# Patient Record
Sex: Male | Born: 1935 | Race: Black or African American | Hispanic: No | Smoking: Former smoker
Health system: Southern US, Community
[De-identification: ages and names within clinical notes are randomized; demographics above are authoritative.]

## PROBLEM LIST (undated history)

## (undated) DIAGNOSIS — F329 Major depressive disorder, single episode, unspecified: Secondary | ICD-10-CM

## (undated) DIAGNOSIS — R7303 Prediabetes: Secondary | ICD-10-CM

## (undated) DIAGNOSIS — E119 Type 2 diabetes mellitus without complications: Secondary | ICD-10-CM

## (undated) DIAGNOSIS — I739 Peripheral vascular disease, unspecified: Secondary | ICD-10-CM

## (undated) DIAGNOSIS — J310 Chronic rhinitis: Secondary | ICD-10-CM

## (undated) DIAGNOSIS — C679 Malignant neoplasm of bladder, unspecified: Secondary | ICD-10-CM

## (undated) DIAGNOSIS — Z8042 Family history of malignant neoplasm of prostate: Secondary | ICD-10-CM

## (undated) DIAGNOSIS — J449 Chronic obstructive pulmonary disease, unspecified: Secondary | ICD-10-CM

## (undated) DIAGNOSIS — J069 Acute upper respiratory infection, unspecified: Secondary | ICD-10-CM

## (undated) DIAGNOSIS — C61 Malignant neoplasm of prostate: Secondary | ICD-10-CM

## (undated) DIAGNOSIS — I499 Cardiac arrhythmia, unspecified: Secondary | ICD-10-CM

## (undated) DIAGNOSIS — F32A Depression, unspecified: Secondary | ICD-10-CM

## (undated) DIAGNOSIS — M1990 Unspecified osteoarthritis, unspecified site: Secondary | ICD-10-CM

## (undated) DIAGNOSIS — N200 Calculus of kidney: Secondary | ICD-10-CM

## (undated) DIAGNOSIS — I10 Essential (primary) hypertension: Secondary | ICD-10-CM

## (undated) DIAGNOSIS — IMO0002 Reserved for concepts with insufficient information to code with codable children: Secondary | ICD-10-CM

## (undated) DIAGNOSIS — Z5189 Encounter for other specified aftercare: Secondary | ICD-10-CM

## (undated) DIAGNOSIS — K635 Polyp of colon: Secondary | ICD-10-CM

## (undated) DIAGNOSIS — IMO0001 Reserved for inherently not codable concepts without codable children: Secondary | ICD-10-CM

## (undated) HISTORY — DX: Unspecified osteoarthritis, unspecified site: M19.90

## (undated) HISTORY — DX: Malignant neoplasm of prostate: C61

## (undated) HISTORY — DX: Type 2 diabetes mellitus without complications: E11.9

## (undated) HISTORY — DX: Polyp of colon: K63.5

## (undated) HISTORY — DX: Malignant neoplasm of bladder, unspecified: C67.9

## (undated) HISTORY — PX: ELBOW BURSA SURGERY: SHX615

## (undated) HISTORY — DX: Depression, unspecified: F32.A

## (undated) HISTORY — PX: HYDROCELE EXCISION: SHX482

## (undated) HISTORY — DX: Calculus of kidney: N20.0

## (undated) HISTORY — DX: Major depressive disorder, single episode, unspecified: F32.9

---
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
## 1985-09-09 HISTORY — PX: PROSTATE SURGERY: SHX751

## 1998-06-26 ENCOUNTER — Encounter: Payer: Self-pay | Admitting: *Deleted

## 1998-06-30 ENCOUNTER — Ambulatory Visit (HOSPITAL_COMMUNITY): Admission: RE | Admit: 1998-06-30 | Discharge: 1998-06-30 | Payer: Self-pay | Admitting: *Deleted

## 1999-09-10 HISTORY — PX: TOTAL HIP ARTHROPLASTY: SHX124

## 1999-11-12 ENCOUNTER — Encounter: Payer: Self-pay | Admitting: *Deleted

## 1999-11-12 ENCOUNTER — Encounter: Admission: RE | Admit: 1999-11-12 | Discharge: 1999-11-12 | Payer: Self-pay | Admitting: *Deleted

## 1999-11-14 ENCOUNTER — Encounter: Payer: Self-pay | Admitting: *Deleted

## 1999-11-15 ENCOUNTER — Encounter: Payer: Self-pay | Admitting: *Deleted

## 1999-11-15 ENCOUNTER — Observation Stay (HOSPITAL_COMMUNITY): Admission: RE | Admit: 1999-11-15 | Discharge: 1999-11-16 | Payer: Self-pay | Admitting: *Deleted

## 1999-12-10 ENCOUNTER — Ambulatory Visit (HOSPITAL_COMMUNITY): Admission: RE | Admit: 1999-12-10 | Discharge: 1999-12-10 | Payer: Self-pay | Admitting: Pediatrics

## 1999-12-10 ENCOUNTER — Encounter: Payer: Self-pay | Admitting: *Deleted

## 2000-02-11 ENCOUNTER — Encounter (INDEPENDENT_AMBULATORY_CARE_PROVIDER_SITE_OTHER): Payer: Self-pay | Admitting: *Deleted

## 2000-02-11 ENCOUNTER — Inpatient Hospital Stay (HOSPITAL_COMMUNITY): Admission: RE | Admit: 2000-02-11 | Discharge: 2000-02-15 | Payer: Self-pay | Admitting: Orthopaedic Surgery

## 2002-09-09 HISTORY — PX: CYST EXCISION: SHX5701

## 2002-09-17 ENCOUNTER — Ambulatory Visit (HOSPITAL_COMMUNITY): Admission: RE | Admit: 2002-09-17 | Discharge: 2002-09-17 | Payer: Self-pay | Admitting: *Deleted

## 2002-09-17 ENCOUNTER — Encounter: Payer: Self-pay | Admitting: *Deleted

## 2003-08-11 ENCOUNTER — Encounter: Admission: RE | Admit: 2003-08-11 | Discharge: 2003-08-11 | Payer: Self-pay | Admitting: General Surgery

## 2003-08-15 ENCOUNTER — Ambulatory Visit (HOSPITAL_BASED_OUTPATIENT_CLINIC_OR_DEPARTMENT_OTHER): Admission: RE | Admit: 2003-08-15 | Discharge: 2003-08-15 | Payer: Self-pay | Admitting: General Surgery

## 2003-08-15 ENCOUNTER — Ambulatory Visit (HOSPITAL_COMMUNITY): Admission: RE | Admit: 2003-08-15 | Discharge: 2003-08-15 | Payer: Self-pay | Admitting: General Surgery

## 2003-08-15 ENCOUNTER — Encounter (INDEPENDENT_AMBULATORY_CARE_PROVIDER_SITE_OTHER): Payer: Self-pay | Admitting: *Deleted

## 2003-09-10 HISTORY — PX: BUNIONECTOMY: SHX129

## 2004-08-27 ENCOUNTER — Ambulatory Visit: Payer: Self-pay | Admitting: Internal Medicine

## 2004-09-12 ENCOUNTER — Ambulatory Visit: Payer: Self-pay | Admitting: Internal Medicine

## 2004-09-21 ENCOUNTER — Ambulatory Visit: Payer: Self-pay | Admitting: Internal Medicine

## 2005-06-19 ENCOUNTER — Emergency Department (HOSPITAL_COMMUNITY): Admission: EM | Admit: 2005-06-19 | Discharge: 2005-06-19 | Payer: Self-pay | Admitting: Emergency Medicine

## 2005-07-27 ENCOUNTER — Emergency Department (HOSPITAL_COMMUNITY): Admission: EM | Admit: 2005-07-27 | Discharge: 2005-07-27 | Payer: Self-pay | Admitting: Emergency Medicine

## 2006-10-08 ENCOUNTER — Ambulatory Visit: Payer: Self-pay | Admitting: Gastroenterology

## 2006-10-22 ENCOUNTER — Ambulatory Visit: Payer: Self-pay | Admitting: Gastroenterology

## 2006-10-22 ENCOUNTER — Encounter (INDEPENDENT_AMBULATORY_CARE_PROVIDER_SITE_OTHER): Payer: Self-pay | Admitting: *Deleted

## 2007-02-25 ENCOUNTER — Encounter: Admission: RE | Admit: 2007-02-25 | Discharge: 2007-02-25 | Payer: Self-pay | Admitting: Orthopaedic Surgery

## 2007-04-28 ENCOUNTER — Ambulatory Visit: Payer: Self-pay | Admitting: Internal Medicine

## 2007-05-07 ENCOUNTER — Ambulatory Visit: Payer: Self-pay | Admitting: Internal Medicine

## 2007-05-19 ENCOUNTER — Ambulatory Visit: Payer: Self-pay | Admitting: Internal Medicine

## 2007-06-10 HISTORY — PX: BACK SURGERY: SHX140

## 2007-06-24 ENCOUNTER — Inpatient Hospital Stay (HOSPITAL_COMMUNITY): Admission: RE | Admit: 2007-06-24 | Discharge: 2007-06-26 | Payer: Self-pay | Admitting: Orthopaedic Surgery

## 2007-09-17 DIAGNOSIS — J310 Chronic rhinitis: Secondary | ICD-10-CM | POA: Insufficient documentation

## 2007-09-17 DIAGNOSIS — Z8546 Personal history of malignant neoplasm of prostate: Secondary | ICD-10-CM | POA: Insufficient documentation

## 2007-09-17 DIAGNOSIS — J449 Chronic obstructive pulmonary disease, unspecified: Secondary | ICD-10-CM | POA: Insufficient documentation

## 2007-09-17 DIAGNOSIS — IMO0002 Reserved for concepts with insufficient information to code with codable children: Secondary | ICD-10-CM | POA: Insufficient documentation

## 2007-09-21 ENCOUNTER — Encounter: Payer: Self-pay | Admitting: Internal Medicine

## 2008-01-14 ENCOUNTER — Ambulatory Visit: Payer: Self-pay | Admitting: Internal Medicine

## 2008-12-09 ENCOUNTER — Emergency Department (HOSPITAL_COMMUNITY): Admission: EM | Admit: 2008-12-09 | Discharge: 2008-12-09 | Payer: Self-pay | Admitting: Emergency Medicine

## 2009-08-09 HISTORY — PX: CYSTO: SHX6284

## 2009-08-14 ENCOUNTER — Ambulatory Visit (HOSPITAL_BASED_OUTPATIENT_CLINIC_OR_DEPARTMENT_OTHER): Admission: RE | Admit: 2009-08-14 | Discharge: 2009-08-14 | Payer: Self-pay | Admitting: Urology

## 2009-09-06 ENCOUNTER — Encounter (INDEPENDENT_AMBULATORY_CARE_PROVIDER_SITE_OTHER): Payer: Self-pay | Admitting: Orthopaedic Surgery

## 2009-09-06 ENCOUNTER — Inpatient Hospital Stay (HOSPITAL_COMMUNITY): Admission: RE | Admit: 2009-09-06 | Discharge: 2009-09-09 | Payer: Self-pay | Admitting: Orthopaedic Surgery

## 2010-11-24 LAB — CBC
HCT: 31 % — ABNORMAL LOW (ref 39.0–52.0)
Hemoglobin: 10.2 g/dL — ABNORMAL LOW (ref 13.0–17.0)
MCHC: 33 g/dL (ref 30.0–36.0)
MCV: 72.3 fL — ABNORMAL LOW (ref 78.0–100.0)
Platelets: 203 10*3/uL (ref 150–400)
RBC: 4.29 MIL/uL (ref 4.22–5.81)
RDW: 15.4 % (ref 11.5–15.5)
WBC: 11.4 10*3/uL — ABNORMAL HIGH (ref 4.0–10.5)

## 2010-11-24 LAB — PROTIME-INR
INR: 1.5 — ABNORMAL HIGH (ref 0.00–1.49)
Prothrombin Time: 18 seconds — ABNORMAL HIGH (ref 11.6–15.2)

## 2010-12-10 LAB — BASIC METABOLIC PANEL
BUN: 12 mg/dL (ref 6–23)
BUN: 9 mg/dL (ref 6–23)
CO2: 29 mEq/L (ref 19–32)
CO2: 29 mEq/L (ref 19–32)
Calcium: 8.3 mg/dL — ABNORMAL LOW (ref 8.4–10.5)
Calcium: 8.4 mg/dL (ref 8.4–10.5)
Chloride: 101 mEq/L (ref 96–112)
Chloride: 99 mEq/L (ref 96–112)
Creatinine, Ser: 0.96 mg/dL (ref 0.4–1.5)
Creatinine, Ser: 0.98 mg/dL (ref 0.4–1.5)
GFR calc Af Amer: >60 mL/min (ref >60)
GFR calc Af Amer: >60 mL/min (ref >60)
GFR calc non Af Amer: >60 mL/min (ref >60)
GFR calc non Af Amer: >60 mL/min (ref >60)
Glucose, Bld: 131 mg/dL — ABNORMAL HIGH (ref 70–99)
Glucose, Bld: 142 mg/dL — ABNORMAL HIGH (ref 70–99)
Potassium: 4.1 mEq/L (ref 3.5–5.1)
Potassium: 4.1 mEq/L (ref 3.5–5.1)
Sodium: 134 mEq/L — ABNORMAL LOW (ref 135–145)
Sodium: 137 mEq/L (ref 135–145)

## 2010-12-10 LAB — CBC
HCT: 32.4 % — ABNORMAL LOW (ref 39.0–52.0)
HCT: 32.4 % — ABNORMAL LOW (ref 39.0–52.0)
HCT: 38.8 % — ABNORMAL LOW (ref 39.0–52.0)
Hemoglobin: 10.3 g/dL — ABNORMAL LOW (ref 13.0–17.0)
Hemoglobin: 10.4 g/dL — ABNORMAL LOW (ref 13.0–17.0)
Hemoglobin: 12.4 g/dL — ABNORMAL LOW (ref 13.0–17.0)
MCHC: 31.9 g/dL (ref 30.0–36.0)
MCHC: 32.1 g/dL (ref 30.0–36.0)
MCHC: 32.1 g/dL (ref 30.0–36.0)
MCV: 72.7 fL — ABNORMAL LOW (ref 78.0–100.0)
MCV: 73 fL — ABNORMAL LOW (ref 78.0–100.0)
MCV: 73.2 fL — ABNORMAL LOW (ref 78.0–100.0)
Platelets: 226 10*3/uL (ref 150–400)
Platelets: 233 10*3/uL (ref 150–400)
Platelets: 250 10*3/uL (ref 150–400)
RBC: 4.42 MIL/uL (ref 4.22–5.81)
RBC: 4.44 MIL/uL (ref 4.22–5.81)
RBC: 5.34 MIL/uL (ref 4.22–5.81)
RDW: 15.3 % (ref 11.5–15.5)
RDW: 15.4 % (ref 11.5–15.5)
RDW: 15.7 % — ABNORMAL HIGH (ref 11.5–15.5)
WBC: 10.5 10*3/uL (ref 4.0–10.5)
WBC: 6.3 10*3/uL (ref 4.0–10.5)
WBC: 7 10*3/uL (ref 4.0–10.5)

## 2010-12-10 LAB — URINALYSIS, ROUTINE W REFLEX MICROSCOPIC
Bilirubin Urine: NEGATIVE
Glucose, UA: NEGATIVE mg/dL
Hgb urine dipstick: NEGATIVE
Ketones, ur: NEGATIVE mg/dL
Nitrite: NEGATIVE
Protein, ur: NEGATIVE mg/dL
Specific Gravity, Urine: 1.02 (ref 1.005–1.030)
Urobilinogen, UA: 1 mg/dL (ref 0.0–1.0)
pH: 7 (ref 5.0–8.0)

## 2010-12-10 LAB — URINE MICROSCOPIC-ADD ON

## 2010-12-10 LAB — COMPREHENSIVE METABOLIC PANEL
ALT: 23 U/L (ref 0–53)
AST: 26 U/L (ref 0–37)
Albumin: 3.7 g/dL (ref 3.5–5.2)
Alkaline Phosphatase: 53 U/L (ref 39–117)
BUN: 15 mg/dL (ref 6–23)
CO2: 27 mEq/L (ref 19–32)
Calcium: 9.2 mg/dL (ref 8.4–10.5)
Chloride: 103 mEq/L (ref 96–112)
Creatinine, Ser: 0.82 mg/dL (ref 0.4–1.5)
GFR calc Af Amer: >60 mL/min (ref >60)
GFR calc non Af Amer: >60 mL/min (ref >60)
Glucose, Bld: 95 mg/dL (ref 70–99)
Potassium: 4.6 mEq/L (ref 3.5–5.1)
Sodium: 137 mEq/L (ref 135–145)
Total Bilirubin: 0.8 mg/dL (ref 0.3–1.2)
Total Protein: 6.4 g/dL (ref 6.0–8.3)

## 2010-12-10 LAB — PROTIME-INR
INR: 1.15 (ref 0.00–1.49)
INR: 1.33 (ref 0.00–1.49)
Prothrombin Time: 14.6 seconds (ref 11.6–15.2)
Prothrombin Time: 16.4 seconds — ABNORMAL HIGH (ref 11.6–15.2)

## 2010-12-10 LAB — BRAIN NATRIURETIC PEPTIDE: Pro B Natriuretic peptide (BNP): <30 pg/mL (ref 0.0–100.0)

## 2010-12-11 LAB — POCT I-STAT 4, (NA,K, GLUC, HGB,HCT)
Glucose, Bld: 103 mg/dL — ABNORMAL HIGH (ref 70–99)
HCT: 44 % (ref 39.0–52.0)
Hemoglobin: 15 g/dL (ref 13.0–17.0)
Potassium: 4.3 mEq/L (ref 3.5–5.1)
Sodium: 141 mEq/L (ref 135–145)

## 2010-12-19 LAB — URINALYSIS, ROUTINE W REFLEX MICROSCOPIC
Bilirubin Urine: NEGATIVE
Glucose, UA: NEGATIVE mg/dL
Hgb urine dipstick: NEGATIVE
Ketones, ur: NEGATIVE mg/dL
Nitrite: NEGATIVE
Protein, ur: NEGATIVE mg/dL
Specific Gravity, Urine: 1.023 (ref 1.005–1.030)
Urobilinogen, UA: 0.2 mg/dL (ref 0.0–1.0)
pH: 6 (ref 5.0–8.0)

## 2011-01-22 NOTE — Op Note (Signed)
Geoffrey Frank, Geoffrey Frank               ACCOUNT NO.:  [id number]   MEDICAL RECORD NO.:  [id number]          PATIENT TYPE:  INP   LOCATION:  2550                         FACILITY:  [facility]   PHYSICIAN:  Mark C. Ophelia Charter, M.D.    DATE OF BIRTH:  [birth year]   DATE OF PROCEDURE:  06/24/2007  DATE OF DISCHARGE:                               OPERATIVE REPORT   PREOPERATIVE DIAGNOSIS:  Multilevel lumbar stenosis.   POSTOPERATIVE DIAGNOSIS:  Multilevel lumbar stenosis.   OPERATION/PROCEDURE:  L1-2, L2-3, L3-4 central decompression.   SURGEON:  Annell Greening, M.D.   ASSISTANT:  Maud Deed, PA-C   ANESTHESIA:  General orotracheal.   ESTIMATED BLOOD LOSS:  250 mL.   DRAINS:  One Hemovac subcutaneously.   INDICATIONS:  This 77 year old male has had spinal stenosis symptoms and  has to lean forward to ambulate.  He has developed progressive foot drop  on one side and some quad weakness in the opposite side.  Preoperative  CT myelogram showed complete block at the 3-4 level.  He has had  previous surgery at 4-5 level and has severe stenosis at the 2-3 and  severe at L1-2 as well.   DESCRIPTION OF PROCEDURE:  After induction general anesthesia, the  patient was placed prone on chest rolls.  Back was prepped with  DuraPrep.  The area was squared with towels.  Sterile skin marker on the  old incision as well as extension proximally and Betadine Vi-Drape.  Needle localization with two spinal needles were placed.  Cross-table  lateral x-ray was taken.  Based on the x-rays, the incision was  proceeded down the spinous process with subperiosteal dissection.  There  were very narrow distance between the facets, about 1.5 cm on one side  and 1 cm on the other.  The patient did have some scoliosis.  Spinous  processes were removed.  Lamina was thin.  The power bur was used to  thin down the lamina which was very thick.  There were extremely thick  chunks of ligament.  Distally where the patient had  previous surgery,  the dura was scarred down and as chunks of ligament were being removed  at the level of the L4-5 disk on the left, small dural tear occurred at  the base.  There was a pinpoint hole.  Patty was placed and we continued  decompression with removal of bones out laterally.  The lateral gutters  were palpated with D'Errico, flipped over so that the beveled edge felt  the lateral wall and the areas identified that were still stenotic were  removed as well as chunks of ligament.  Operative microscope was used  and then decompression from L1 all the way down was performed.  Then x-  ray was taken which showed the Penfield 4 was at the top of the L4  pedicle and a more proximal Nicholos Johns was just above the level of L1-2  disk which was the planned decompression.  The Tisseel was mixed and  inspection did not reveal any remaining from the previous dural tear.  No fluid was seen to the area  where there had been few drops of fluid  present.  The Tisseel was dripped down on the  left at the L4-5 disk level.  After irrigation with a saline solution,  the fascia was closed with 0 Vicryl.  Subcutaneous Hemovac was placed, 2-  0 Vicryl in the subcutaneous tissue, subcuticular skin closure, postop  dressing and transferred to her room.  Foley been inserted at the  beginning of the case.  The patient tolerated the procedure well.      Mark C. Ophelia Charter, M.D.  Electronically Signed     MCY/MEDQ  D:  06/24/2007  T:  06/25/2007  Job:  [id number]
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
## 2011-01-25 NOTE — Discharge Summary (Signed)
Geoffrey Frank, Geoffrey Frank               ACCOUNT NO.:  [id number]   MEDICAL RECORD NO.:  [id number]          PATIENT TYPE:  INP   LOCATION:  5009                         FACILITY:  [facility]   PHYSICIAN:  Mark C. Ophelia Charter, M.D.    DATE OF BIRTH:  [birth year]   DATE OF ADMISSION:  06/24/2007  DATE OF DISCHARGE:  06/26/2007                               DISCHARGE SUMMARY   FINAL DIAGNOSIS:  Multilevel lumbar spinal stenosis, L1-2, L2-3, L3-4.   OPERATIONS/PROCEDURES:  Central decompression of L1-2, L2-3, and L3-4  and repair of dural tear.  That was on June 24, 2007.   This is a 77 year old male who has had previous decompression of 4-5,  developed progressive neurologic claudication symptoms with inability to  ambulate more than short distances and myelogram CT scan showed near  complete block at L3-4.  He had severe stenosis at L1-2 and L2-3 with  critical at L3-4.  He has had problems with weakness using a walker for  ambulation with quad weakness.   ADMISSION MEDICATIONS:  1. Advair 500/50, one puff b.i.d.  2. Lupron injections every 4 months.  3. Tylenol Arthritis.  4. Lasix 20 mg p.o. daily.  5. Azor 10/40 mg p.o. daily.  6. Klor-Con 20 mEq p.o. daily.   HOSPITAL COURSE:  The patient was admitted and taken to the operating  room on June 24, 2007, after informed consent with preoperative  antibiotics, and received the above procedure.  A tiny dural tear  occurred which was repaired and Tisseel was applied.  Postoperatively,  the patient had improvement in his quad strength.  He was ambulatory  with therapy.  No problems with a headache.  He was walking up and down  the halls without claudication symptoms and was discharged on June 26, 2007.  Dressing was dry.   Office followup one week.   He is taking:  1. Percocet 1 p.o. q.6 h. for pain.  2. Robaxin 500 p.r.n.  And the same medications he took on admission.   Hemoglobin post-op was 9.1 and stable.     Mark C. Ophelia Charter,  M.D.  Electronically Signed    MCY/MEDQ  D:  08/24/2007  T:  08/24/2007  Job:  [id number]
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
## 2011-01-25 NOTE — H&P (Signed)
[place]. [facility]  Patient:    Geoffrey Frank, Geoffrey Frank                      MRN: [id number] Adm. Date:  [id number] Disc. Date: [id number] Attending:  Jacki Cones Dictator:   Quinn Plowman, P.A.-C.                         History and Physical  DATE OF BIRTH:  1925/12/19  CHIEF COMPLAINT:  Right hip pain for multiple years, worse with ambulation, sitting, and squatting.  HISTORY OF PRESENT ILLNESS:  The patient is a 77 year old African-American male who presents with degenerative right hip disease, and painful hip while ambulation.  Worse with squatting and standing from a sitting position. Symptoms are worse at night.  Frequency is everyday, exquisite pain in the right hip, and pain is nonradiating.  The patient relates the pain is an 8/10, aggravated by standing or squatting position.  Associated pain is none.  The patient has tried multiple nonsteroidal anti-inflammatories, but the degenerative nature of the hip has influenced the patient decided to go ahead with a total hip arthroplasty.  PAST MEDICAL HISTORY:  In 1987, diagnosed with prostate cancer.  PAST SURGICAL HISTORY:  In 1987, prostate surgery secondary to cancer where he received radiation.  In 1994, he had L4 and L5 decompression.  In 1991, he had a right ureteral secondary to renal calculus removal.  In 2001, he had a left eye surgery and laser surgery.  FAMILY HISTORY:  His mother and father both have hypertension, CAD.  His mother died of bone cancer.  Denies diabetes mellitus, other cancers, seizure disorders.  SOCIAL HISTORY:  He is a one-pack-per-day smoker x 20 years.  Currently a nonsmoker.  Denies any alcohol abuse or illicit drug use.  MEDICATIONS:  Hyzaar 100/25 mg and Naprosyn 500 mg b.i.d. p.r.n.  However, he has discontinued using Naprosyn.  ALLERGIES:  No known drug allergies and no known allergies.  REVIEW OF SYSTEMS:  General: Denied any weight changes, fatigue,  weakness, fever, chills, night sweats.  Skin: Denies any active infections, nail changes, itching, rashes, sores, or large moles.  Head:  Denies any trauma, frequent headaches, nausea, vomiting, visual changes.  Eyes:  Positive for reading glasses.  Denies any visual changes, _______, blurriness, tearing. Ears: Denies any hearing loss, tinnitus, vertigo.  Nose:  Sinus has a sinus pressure, rhinorrhea, stuffiness, sneezing.  Mouth, throat, and neck: Denies bleeding gums, hoarseness, sore throat.  Positive for upper and lower dentures.  Breasts: Denies any changes, lumps, or masses.  Respiratory: Denies any wheezing, shortness of breath, cough, fever, or hemoptysis. Cardiac: Positive hypertension.  Denies murmurs, angina, palpitations, or dyspnea on exertion, orthopnea, PND, or lower extremity edema.  GI: Positive occasional constipation.  Denies appetite changes, nausea, vomiting, indigestion, GERD reflux, jaundice, hepatitis, emesis.  Genitourinary: Denies any frequency, hesitancy, polyuria, polydipsia, positive renal calculi stone, Genital: Denies any penile discharges, hernias, masses, testicular pain, or lumps.  Also, denies any lower extremity edema, claudication, varicose veins. Musculoskeletal: Positive arthritis, positive right hip pain, positive decreased range of motion, positive joint stiffness, all in the right hip. Neurologic: Denies sensation and loss of sensation, numbness, tingling. Hematologic: Denies any easy bruising.  Endocrine: Denies diabetes mellitus, thyroid problems, heat or cold intolerances including sweating.  PHYSICAL EXAMINATION:  VITAL SIGNS:  In the office are 97.8, pulse 76 and regular, respirations 16 and regular, BP  is 142/80 and regular.  GENERAL:  This is a 77 year old African-American male, well-developed and well-nourished, alert and oriented x 3 in no acute distress.  SKIN:  Without infection.  Surgical scar noted of the lower lumbar  back, vertical.  HEENT:  Head normocephalic and atraumatic.  Eyes PERRLA.  EOMs intact. Conjunctivae pink.  Sclerae pink.  No fundal papilledema.  Ears - TMs pale and gray bilaterally.  Nose midline.  No tenderness, erythema.  Nose and throat positive upper and lower dentures.  No erythema or exudate of the oropharynx.  NECK:  Without masses, thyromegaly, lymphadenopathy, JVD, or carotid bruits.  BREASTS:  Within normal limits.  HEART:  Regular rate and rhythm without gallops, murmurs, or rubs, S1 and S2.  EXTREMITIES:  No clubbing, cyanosis, or edema.  LUNGS:  Clear to auscultation bilaterally without wheezing, rhonchi, or rales.  ABDOMEN:  Soft, bowel sounds in all four quadrant.  No masses, tenderness, or organomegaly.  No CVA tenderness.  RECTAL:  Deferred.  MUSCULOSKELETAL:  Positive decreased range of motion of internal and external rotation of hip flexion to the right as compared to the left.  Strength is decreased, +4/5 as compared to 5/5 on the left.  Distal and neurovascular sensation is intact in the lower extremities.  Pulses are +2/4 bilaterally. Deep tendon reflexes in the right lower extremity is 0/4 as compared to 1/4 on the left.  NEUROLOGIC:  Cranial nerves 2-12 grossly intact.  Gait is Trendelenburg.  LABORATORY DATA:  X-rays revealed a right hip osteoarthritis and degenerative disease.  PLAN:  To go forth on February 11, 2000 with a right total hip arthroplasty using Osteonics cement Press-Fit acetabulum.  The risks and ramifications were explained to the patient by Loraine Leriche C. Ophelia Charter, M.D.  The risks of infection include infection, bleeding, fracture, nerve damage, artery damage, repeat surgery, dislocation, and ultimately death.  The patient understood and had all questions answered fully, and no further questions at the end of the exam, and wishes to proceed with the surgery. DD:  02/11/00 TD:  02/15/00 Job: [id number]

## 2011-03-27 ENCOUNTER — Emergency Department (HOSPITAL_COMMUNITY)
Admission: EM | Admit: 2011-03-27 | Discharge: 2011-03-27 | Disposition: A | Discharge status: Home / Self Care | Payer: Medicare Other | Attending: Emergency Medicine | Admitting: Emergency Medicine

## 2011-03-27 DIAGNOSIS — I10 Essential (primary) hypertension: Secondary | ICD-10-CM | POA: Insufficient documentation

## 2011-03-27 DIAGNOSIS — I498 Other specified cardiac arrhythmias: Secondary | ICD-10-CM | POA: Insufficient documentation

## 2011-03-27 LAB — URINALYSIS, ROUTINE W REFLEX MICROSCOPIC
Bilirubin Urine: NEGATIVE
Glucose, UA: NEGATIVE mg/dL
Hgb urine dipstick: NEGATIVE
Ketones, ur: NEGATIVE mg/dL
Leukocytes, UA: NEGATIVE
Nitrite: NEGATIVE
Protein, ur: NEGATIVE mg/dL
Specific Gravity, Urine: 1.008 (ref 1.005–1.030)
Urobilinogen, UA: 0.2 mg/dL (ref 0.0–1.0)
pH: 7.5 (ref 5.0–8.0)

## 2011-03-27 LAB — POCT I-STAT, CHEM 8
BUN: 17 mg/dL (ref 6–23)
Calcium, Ion: 1.15 mmol/L (ref 1.12–1.32)
Chloride: 105 mEq/L (ref 96–112)
Creatinine, Ser: 0.9 mg/dL (ref 0.50–1.35)
Glucose, Bld: 110 mg/dL — ABNORMAL HIGH (ref 70–99)
HCT: 46 % (ref 39.0–52.0)
Hemoglobin: 15.6 g/dL (ref 13.0–17.0)
Potassium: 4.5 mEq/L (ref 3.5–5.1)
Sodium: 138 mEq/L (ref 135–145)
TCO2: 26 mmol/L (ref 0–100)

## 2011-03-27 LAB — TROPONIN I: Troponin I: <0.3 ng/mL (ref <0.30)

## 2011-03-27 LAB — CK TOTAL AND CKMB (NOT AT ARMC)
CK, MB: 6.5 ng/mL (ref 0.3–4.0)
Relative Index: 2.3 (ref 0.0–2.5)
Total CK: 282 U/L — ABNORMAL HIGH (ref 7–232)

## 2011-04-10 HISTORY — PX: NM MYOCAR PERF WALL MOTION: HXRAD629

## 2011-06-19 LAB — HEMOGLOBIN AND HEMATOCRIT, BLOOD
HCT: 28.5 — ABNORMAL LOW
Hemoglobin: 9.1 — ABNORMAL LOW

## 2011-06-20 LAB — PROTIME-INR
INR: 1
Prothrombin Time: 13

## 2011-06-20 LAB — BASIC METABOLIC PANEL
BUN: 17
CO2: 29
Calcium: 9.6
Chloride: 102
Creatinine, Ser: 1.02
GFR calc Af Amer: >60
GFR calc non Af Amer: >60
Glucose, Bld: 105 — ABNORMAL HIGH
Potassium: 4.1
Sodium: 139

## 2011-06-20 LAB — URINALYSIS, ROUTINE W REFLEX MICROSCOPIC
Bilirubin Urine: NEGATIVE
Glucose, UA: NEGATIVE
Hgb urine dipstick: NEGATIVE
Ketones, ur: NEGATIVE
Nitrite: NEGATIVE
Protein, ur: NEGATIVE
Specific Gravity, Urine: 1.018
Urobilinogen, UA: 0.2
pH: 5

## 2011-06-20 LAB — DIFFERENTIAL
Basophils Absolute: 0
Basophils Relative: 1
Eosinophils Absolute: 0.2
Eosinophils Relative: 4
Lymphocytes Relative: 22
Lymphs Abs: 1.3
Monocytes Absolute: 0.4
Monocytes Relative: 7
Neutro Abs: 3.9
Neutrophils Relative %: 67

## 2011-06-20 LAB — CBC
HCT: 36.3 — ABNORMAL LOW
Hemoglobin: 11.4 — ABNORMAL LOW
MCHC: 31.5
MCV: 72 — ABNORMAL LOW
Platelets: 325
RBC: 5.04
RDW: 14.6 — ABNORMAL HIGH
WBC: 5.8

## 2011-06-20 LAB — APTT: aPTT: 33

## 2011-08-10 HISTORY — PX: JOINT REPLACEMENT: SHX530

## 2011-08-23 ENCOUNTER — Other Ambulatory Visit (HOSPITAL_COMMUNITY): Payer: Self-pay | Admitting: Urology

## 2011-08-23 DIAGNOSIS — C61 Malignant neoplasm of prostate: Secondary | ICD-10-CM

## 2011-08-27 ENCOUNTER — Other Ambulatory Visit: Payer: Self-pay | Admitting: Urology

## 2011-08-28 ENCOUNTER — Other Ambulatory Visit: Payer: Self-pay | Admitting: Urology

## 2011-09-05 ENCOUNTER — Encounter (HOSPITAL_COMMUNITY)
Admission: RE | Admit: 2011-09-05 | Discharge: 2011-09-05 | Disposition: A | Discharge status: Home / Self Care | Payer: Medicare Other | Source: Ambulatory Visit | Attending: Urology | Admitting: Urology

## 2011-09-05 DIAGNOSIS — M545 Low back pain, unspecified: Secondary | ICD-10-CM | POA: Insufficient documentation

## 2011-09-05 DIAGNOSIS — C61 Malignant neoplasm of prostate: Secondary | ICD-10-CM | POA: Insufficient documentation

## 2011-09-05 MED ORDER — TECHNETIUM TC 99M MEDRONATE IV KIT
25.0000 | PACK | Freq: Once | INTRAVENOUS | Status: AC | PRN
  Administered 2011-09-05: 25 via INTRAVENOUS

## 2011-10-07 ENCOUNTER — Encounter (HOSPITAL_BASED_OUTPATIENT_CLINIC_OR_DEPARTMENT_OTHER): Payer: Self-pay | Admitting: *Deleted

## 2011-10-07 NOTE — Progress Notes (Addendum)
Pt instructed npo p mn 2/3 x hydrocodone w sip of h20 if needed. To [facility] 2/.4 @ 0900.  Needs cxr, istat on arrival. Last office note, most recent ekg, cardiac studies requested from Dr. Blanchie Dessert office. [phone])
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
## 2011-10-14 ENCOUNTER — Encounter (HOSPITAL_BASED_OUTPATIENT_CLINIC_OR_DEPARTMENT_OTHER): Payer: Self-pay | Admitting: Anesthesiology

## 2011-10-14 ENCOUNTER — Encounter (HOSPITAL_BASED_OUTPATIENT_CLINIC_OR_DEPARTMENT_OTHER)
Admission: RE | Disposition: A | Discharge status: Home / Self Care | Payer: Self-pay | Source: Ambulatory Visit | Attending: Urology

## 2011-10-14 ENCOUNTER — Ambulatory Visit (HOSPITAL_COMMUNITY): Payer: Medicare Other

## 2011-10-14 ENCOUNTER — Ambulatory Visit (HOSPITAL_BASED_OUTPATIENT_CLINIC_OR_DEPARTMENT_OTHER): Payer: Medicare Other | Admitting: Anesthesiology

## 2011-10-14 ENCOUNTER — Encounter (HOSPITAL_BASED_OUTPATIENT_CLINIC_OR_DEPARTMENT_OTHER): Payer: Self-pay | Admitting: *Deleted

## 2011-10-14 ENCOUNTER — Other Ambulatory Visit: Payer: Self-pay | Admitting: Urology

## 2011-10-14 ENCOUNTER — Ambulatory Visit (HOSPITAL_BASED_OUTPATIENT_CLINIC_OR_DEPARTMENT_OTHER)
Admission: RE | Admit: 2011-10-14 | Discharge: 2011-10-14 | Disposition: A | Discharge status: Home / Self Care | Payer: Medicare Other | Source: Ambulatory Visit | Attending: Urology | Admitting: Urology

## 2011-10-14 DIAGNOSIS — J4489 Other specified chronic obstructive pulmonary disease: Secondary | ICD-10-CM | POA: Insufficient documentation

## 2011-10-14 DIAGNOSIS — Z96649 Presence of unspecified artificial hip joint: Secondary | ICD-10-CM | POA: Insufficient documentation

## 2011-10-14 DIAGNOSIS — M899 Disorder of bone, unspecified: Secondary | ICD-10-CM | POA: Insufficient documentation

## 2011-10-14 DIAGNOSIS — I739 Peripheral vascular disease, unspecified: Secondary | ICD-10-CM | POA: Insufficient documentation

## 2011-10-14 DIAGNOSIS — Z01812 Encounter for preprocedural laboratory examination: Secondary | ICD-10-CM | POA: Insufficient documentation

## 2011-10-14 DIAGNOSIS — Z8042 Family history of malignant neoplasm of prostate: Secondary | ICD-10-CM | POA: Insufficient documentation

## 2011-10-14 DIAGNOSIS — C670 Malignant neoplasm of trigone of bladder: Secondary | ICD-10-CM | POA: Insufficient documentation

## 2011-10-14 DIAGNOSIS — C679 Malignant neoplasm of bladder, unspecified: Secondary | ICD-10-CM | POA: Diagnosis present

## 2011-10-14 DIAGNOSIS — I10 Essential (primary) hypertension: Secondary | ICD-10-CM | POA: Insufficient documentation

## 2011-10-14 DIAGNOSIS — C672 Malignant neoplasm of lateral wall of bladder: Secondary | ICD-10-CM | POA: Insufficient documentation

## 2011-10-14 DIAGNOSIS — J449 Chronic obstructive pulmonary disease, unspecified: Secondary | ICD-10-CM | POA: Insufficient documentation

## 2011-10-14 DIAGNOSIS — C61 Malignant neoplasm of prostate: Secondary | ICD-10-CM | POA: Insufficient documentation

## 2011-10-14 DIAGNOSIS — M949 Disorder of cartilage, unspecified: Secondary | ICD-10-CM | POA: Insufficient documentation

## 2011-10-14 DIAGNOSIS — Z01818 Encounter for other preprocedural examination: Secondary | ICD-10-CM | POA: Insufficient documentation

## 2011-10-14 HISTORY — DX: Family history of malignant neoplasm of prostate: Z80.42

## 2011-10-14 HISTORY — DX: Cardiac arrhythmia, unspecified: I49.9

## 2011-10-14 HISTORY — DX: Chronic rhinitis: J31.0

## 2011-10-14 HISTORY — DX: Acute upper respiratory infection, unspecified: J06.9

## 2011-10-14 HISTORY — DX: Chronic obstructive pulmonary disease, unspecified: J44.9

## 2011-10-14 HISTORY — DX: Encounter for other specified aftercare: Z51.89

## 2011-10-14 HISTORY — DX: Peripheral vascular disease, unspecified: I73.9

## 2011-10-14 HISTORY — DX: Essential (primary) hypertension: I10

## 2011-10-14 HISTORY — PX: CYSTOSCOPY WITH BIOPSY: SHX5122

## 2011-10-14 HISTORY — DX: Reserved for concepts with insufficient information to code with codable children: IMO0002

## 2011-10-14 HISTORY — DX: Reserved for inherently not codable concepts without codable children: IMO0001

## 2011-10-14 LAB — POCT I-STAT 4, (NA,K, GLUC, HGB,HCT)
Glucose, Bld: 103 mg/dL — ABNORMAL HIGH (ref 70–99)
HCT: 41 % (ref 39.0–52.0)
Hemoglobin: 13.9 g/dL (ref 13.0–17.0)
Potassium: 4.6 mEq/L (ref 3.5–5.1)
Sodium: 141 mEq/L (ref 135–145)

## 2011-10-14 SURGERY — CYSTOSCOPY, WITH BIOPSY
Anesthesia: General | Site: Bladder | Wound class: Clean Contaminated

## 2011-10-14 MED ORDER — LIDOCAINE HCL (CARDIAC) 20 MG/ML IV SOLN
INTRAVENOUS | Status: DC | PRN
  Administered 2011-10-14: 80 mg via INTRAVENOUS

## 2011-10-14 MED ORDER — STERILE WATER FOR IRRIGATION IR SOLN
Status: DC | PRN
  Administered 2011-10-14: 3000 mL

## 2011-10-14 MED ORDER — CIPROFLOXACIN IN D5W 400 MG/200ML IV SOLN
400.0000 mg | INTRAVENOUS | Status: AC
  Administered 2011-10-14: 400 mg via INTRAVENOUS

## 2011-10-14 MED ORDER — FENTANYL CITRATE 0.05 MG/ML IJ SOLN
INTRAMUSCULAR | Status: DC | PRN
  Administered 2011-10-14 (×3): 25 ug via INTRAVENOUS
  Administered 2011-10-14 (×2): 50 ug via INTRAVENOUS

## 2011-10-14 MED ORDER — LIDOCAINE HCL 2 % EX GEL
CUTANEOUS | Status: DC | PRN
  Administered 2011-10-14: 1 via TOPICAL

## 2011-10-14 MED ORDER — FENTANYL CITRATE 0.05 MG/ML IJ SOLN: 25.0000 ug | INTRAMUSCULAR | Status: DC | PRN

## 2011-10-14 MED ORDER — ONDANSETRON HCL 4 MG/2ML IJ SOLN
INTRAMUSCULAR | Status: DC | PRN
  Administered 2011-10-14: 4 mg via INTRAVENOUS

## 2011-10-14 MED ORDER — PROPOFOL 10 MG/ML IV EMUL
INTRAVENOUS | Status: DC | PRN
  Administered 2011-10-14: 150 mg via INTRAVENOUS

## 2011-10-14 MED ORDER — LACTATED RINGERS IV SOLN
INTRAVENOUS | Status: DC
  Administered 2011-10-14 (×2): via INTRAVENOUS

## 2011-10-14 MED ORDER — PROMETHAZINE HCL 25 MG/ML IJ SOLN: 6.2500 mg | INTRAMUSCULAR | Status: DC | PRN

## 2011-10-14 SURGICAL SUPPLY — 21 items
BAG DRAIN URO-CYSTO SKYTR STRL (DRAIN) ×2 IMPLANT
CANISTER SUCT LVC 12 LTR MEDI- (MISCELLANEOUS) ×2 IMPLANT
CATH ROBINSON RED A/P 14FR (CATHETERS) IMPLANT
CATH ROBINSON RED A/P 16FR (CATHETERS) IMPLANT
CLOTH BEACON ORANGE TIMEOUT ST (SAFETY) ×2 IMPLANT
DRAPE CAMERA CLOSED 9X96 (DRAPES) ×2 IMPLANT
ELECT REM PT RETURN 9FT ADLT (ELECTROSURGICAL) ×2
ELECTRODE REM PT RTRN 9FT ADLT (ELECTROSURGICAL) ×1 IMPLANT
GLOVE BIO SURGEON STRL SZ7.5 (GLOVE) ×2 IMPLANT
GLOVE BIOGEL PI IND STRL 6.5 (GLOVE) ×2 IMPLANT
GLOVE BIOGEL PI INDICATOR 6.5 (GLOVE) ×2
GOWN STRL REIN XL XLG (GOWN DISPOSABLE) ×2 IMPLANT
NDL SAFETY ECLIPSE 18X1.5 (NEEDLE) IMPLANT
NEEDLE HYPO 18GX1.5 SHARP (NEEDLE)
NEEDLE HYPO 22GX1.5 SAFETY (NEEDLE) IMPLANT
NS IRRIG 500ML POUR BTL (IV SOLUTION) IMPLANT
PACK CYSTOSCOPY (CUSTOM PROCEDURE TRAY) ×2 IMPLANT
SYR 20CC LL (SYRINGE) IMPLANT
SYR BULB IRRIGATION 50ML (SYRINGE) IMPLANT
SYRINGE IRR TOOMEY STRL 70CC (SYRINGE) ×2 IMPLANT
WATER STERILE IRR 3000ML UROMA (IV SOLUTION) ×2 IMPLANT

## 2011-10-14 NOTE — Op Note (Signed)
Preoperative diagnosis: Recurrent transitional cell carcinoma bladder Postoperative diagnosis: Same  Procedure: Cystoscopy with cold cup biopsy and fulguration of bladder tumor   Surgeon: Valetta Fuller M.D.  Anesthesia: Gen.  Indications: Geoffrey Frank is 78 years of age.  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 Recent office cystoscopy revealed papillary tumor recurrence.      Technique and findings: Patient was brought to the operating room and had successful induction of general anesthesia. He was placed in lithotomy position and prepped and draped in usual manner. The bladder was carefully panendoscope with 12 and 70 lens systems. Papillary tumor was noted on the right trigone and also on the right lateral wall. Tumor all appeared to be low-grade and noninvasive. Cold cup biopsy was utilized to perform biopsy of this. The majority of the tumor was fulgurated with a Bugbee electrode. No visible tumor remained at the completion of the procedure. The patient had no obvious complications or problems. He was brought to recovery room stable condition.

## 2011-10-14 NOTE — Progress Notes (Signed)
Dr Isabel Caprice states okay for pt to be discharged home without having to void prior to discharge

## 2011-10-14 NOTE — Anesthesia Preprocedure Evaluation (Signed)
Anesthesia Evaluation  Patient identified by MRN, date of birth, ID band Patient awake    Reviewed: Allergy & Precautions, H&P , NPO status , Patient's Chart, lab work & pertinent test results, reviewed documented beta blocker date and time   Airway Mallampati: II TM Distance: >3 FB Neck ROM: Full    Dental  (+) Upper Dentures and Lower Dentures   Pulmonary COPD COPD inhaler,  clear to auscultation  + decreased breath sounds      Cardiovascular hypertension, Pt. on medications + dysrhythmias Regular Normal Ventricular bigeminy, beta blocker Rx   Neuro/Psych Negative Neurological ROS  Negative Psych ROS   GI/Hepatic negative GI ROS, Neg liver ROS,   Endo/Other  Negative Endocrine ROS  Renal/GU negative Renal ROS   Bladder lesion    Musculoskeletal negative musculoskeletal ROS (+)   Abdominal   Peds negative pediatric ROS (+)  Hematology negative hematology ROS (+)   Anesthesia Other Findings   Reproductive/Obstetrics negative OB ROS                           Anesthesia Physical Anesthesia Plan  ASA: III  Anesthesia Plan: General   Post-op Pain Management:    Induction: Intravenous  Airway Management Planned: LMA  Additional Equipment:   Intra-op Plan:   Post-operative Plan: Extubation in OR  Informed Consent: I have reviewed the patients History and Physical, chart, labs and discussed the procedure including the risks, benefits and alternatives for the proposed anesthesia with the patient or authorized representative who has indicated his/her understanding and acceptance.     Plan Discussed with: CRNA and Surgeon  Anesthesia Plan Comments:         Anesthesia Quick Evaluation

## 2011-10-14 NOTE — Anesthesia Procedure Notes (Signed)
Procedure Name: LMA Insertion Date/Time: 10/14/2011 9:58 AM Performed by: Lorrin Jackson Pre-anesthesia Checklist: Patient identified, Emergency Drugs available, Suction available and Patient being monitored Patient Re-evaluated:Patient Re-evaluated prior to inductionOxygen Delivery Method: Circle System Utilized Preoxygenation: Pre-oxygenation with 100% oxygen Intubation Type: IV induction Ventilation: Mask ventilation without difficulty LMA: LMA with gastric port inserted LMA Size: 4.0 Number of attempts: 1 Placement Confirmation: positive ETCO2 Tube secured with: Tape Dental Injury: Teeth and Oropharynx as per pre-operative assessment  Comments: Inserted by Dr. Shireen Quan

## 2011-10-14 NOTE — Progress Notes (Signed)
Glasses and dentures returned to patient.

## 2011-10-14 NOTE — Anesthesia Postprocedure Evaluation (Signed)
  Anesthesia Post-op Note  Patient: Geoffrey Frank  Procedure(s) Performed:  CYSTOSCOPY WITH BIOPSY - CYSTOSCOPY WITH BIOPSY AND FULGERATION    Patient Location: PACU  Anesthesia Type: General  Level of Consciousness: oriented and sedated  Airway and Oxygen Therapy: Patient Spontanous Breathing  Post-op Pain: mild  Post-op Assessment: Post-op Vital signs reviewed, Patient's Cardiovascular Status Stable, Respiratory Function Stable and Patent Airway  Post-op Vital Signs: stable  Complications: No apparent anesthesia complications

## 2011-10-14 NOTE — Interval H&P Note (Signed)
History and Physical Interval Note:  10/14/2011 9:22 AM  Geoffrey Frank  has presented today for surgery, with the diagnosis of bladder cancer  The various methods of treatment have been discussed with the patient and family. After consideration of risks, benefits and other options for treatment, the patient has consented to  Procedure(s): CYSTOSCOPY WITH BIOPSY as a surgical intervention .  The patients' history has been reviewed, patient examined, no change in status, stable for surgery.  I have reviewed the patients' chart and labs.  Questions were answered to the patient's satisfaction.     [place] S

## 2011-10-14 NOTE — Transfer of Care (Signed)
Immediate Anesthesia Transfer of Care Note  Patient: Geoffrey Frank  Procedure(s) Performed:  CYSTOSCOPY WITH BIOPSY - CYSTOSCOPY WITH BIOPSY AND FULGERATION    Patient Location: Patient transported to PACU with oxygen via face mask at 4 Liters / Min  Anesthesia Type: General  Level of Consciousness: awake and alert   Airway & Oxygen Therapy: Patient Spontanous Breathing and Patient connected to face mask oxygen Post-op Assessment: Report given to PACU RN and Post -op Vital signs reviewed and stable  Post vital signs: Reviewed and stable  Complications: No apparent anesthesia complications

## 2011-10-15 ENCOUNTER — Encounter (HOSPITAL_BASED_OUTPATIENT_CLINIC_OR_DEPARTMENT_OTHER): Payer: Self-pay | Admitting: Urology

## 2011-10-30 ENCOUNTER — Encounter: Payer: Self-pay | Admitting: Gastroenterology

## 2012-06-29 ENCOUNTER — Telehealth: Payer: Self-pay | Admitting: Gastroenterology

## 2012-06-29 NOTE — Telephone Encounter (Signed)
Will call if he has any GI problems or symptoms

## 2013-01-04 ENCOUNTER — Other Ambulatory Visit (HOSPITAL_COMMUNITY): Payer: Self-pay | Admitting: Internal Medicine

## 2013-01-04 DIAGNOSIS — R609 Edema, unspecified: Secondary | ICD-10-CM

## 2013-01-04 DIAGNOSIS — R0600 Dyspnea, unspecified: Secondary | ICD-10-CM

## 2013-01-21 ENCOUNTER — Ambulatory Visit (HOSPITAL_COMMUNITY)
Admission: RE | Admit: 2013-01-21 | Discharge: 2013-01-21 | Disposition: A | Discharge status: Home / Self Care | Payer: Medicare PPO | Source: Ambulatory Visit | Attending: Cardiovascular Disease | Admitting: Cardiovascular Disease

## 2013-01-21 DIAGNOSIS — R0602 Shortness of breath: Secondary | ICD-10-CM

## 2013-01-21 DIAGNOSIS — R0989 Other specified symptoms and signs involving the circulatory and respiratory systems: Secondary | ICD-10-CM | POA: Insufficient documentation

## 2013-01-21 DIAGNOSIS — R609 Edema, unspecified: Secondary | ICD-10-CM | POA: Insufficient documentation

## 2013-01-21 DIAGNOSIS — R0600 Dyspnea, unspecified: Secondary | ICD-10-CM

## 2013-01-21 DIAGNOSIS — R0609 Other forms of dyspnea: Secondary | ICD-10-CM | POA: Insufficient documentation

## 2013-01-21 DIAGNOSIS — R42 Dizziness and giddiness: Secondary | ICD-10-CM | POA: Insufficient documentation

## 2013-01-21 NOTE — Progress Notes (Signed)
2D Echo Performed 01/21/2013    Hiawatha Merriott, RCS  

## 2014-01-20 ENCOUNTER — Encounter: Payer: Self-pay | Admitting: *Deleted

## 2014-01-27 ENCOUNTER — Encounter: Payer: Self-pay | Admitting: Internal Medicine

## 2014-01-27 ENCOUNTER — Ambulatory Visit (INDEPENDENT_AMBULATORY_CARE_PROVIDER_SITE_OTHER): Payer: Medicare PPO | Admitting: Internal Medicine

## 2014-01-27 VITALS — BP 170/96 | HR 93 | Ht 72.0 in | Wt 188.8 lb

## 2014-01-27 DIAGNOSIS — I10 Essential (primary) hypertension: Secondary | ICD-10-CM

## 2014-01-27 DIAGNOSIS — R079 Chest pain, unspecified: Secondary | ICD-10-CM

## 2014-01-27 MED ORDER — METOPROLOL TARTRATE 25 MG PO TABS: 25.0000 mg | ORAL_TABLET | Freq: Two times a day (BID) | ORAL | Status: DC

## 2014-01-27 NOTE — Patient Instructions (Addendum)
Your physician has requested that you have a lexiscan myoview. For further information please visit [email or web address]. Please follow instruction sheet, as given.  Your physician has recommended you make the following change in your medication: START metoprolol tartrate 25mg  BID  Your physician recommends that you schedule a follow-up appointment in: 1 month.

## 2014-02-01 ENCOUNTER — Encounter: Payer: Self-pay | Admitting: Internal Medicine

## 2014-02-01 DIAGNOSIS — I10 Essential (primary) hypertension: Secondary | ICD-10-CM | POA: Insufficient documentation

## 2014-02-01 DIAGNOSIS — R079 Chest pain, unspecified: Secondary | ICD-10-CM | POA: Insufficient documentation
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
## 2014-02-08 ENCOUNTER — Telehealth (HOSPITAL_COMMUNITY): Payer: Self-pay

## 2014-02-10 ENCOUNTER — Ambulatory Visit (HOSPITAL_COMMUNITY)
Admission: RE | Admit: 2014-02-10 | Discharge: 2014-02-10 | Disposition: A | Discharge status: Home / Self Care | Payer: Medicare PPO | Source: Ambulatory Visit | Attending: Cardiovascular Disease | Admitting: Cardiovascular Disease

## 2014-02-10 DIAGNOSIS — R002 Palpitations: Secondary | ICD-10-CM | POA: Insufficient documentation

## 2014-02-10 DIAGNOSIS — J449 Chronic obstructive pulmonary disease, unspecified: Secondary | ICD-10-CM | POA: Insufficient documentation

## 2014-02-10 DIAGNOSIS — R42 Dizziness and giddiness: Secondary | ICD-10-CM | POA: Insufficient documentation

## 2014-02-10 DIAGNOSIS — I10 Essential (primary) hypertension: Secondary | ICD-10-CM | POA: Insufficient documentation

## 2014-02-10 DIAGNOSIS — J4489 Other specified chronic obstructive pulmonary disease: Secondary | ICD-10-CM | POA: Insufficient documentation

## 2014-02-10 DIAGNOSIS — I959 Hypotension, unspecified: Secondary | ICD-10-CM | POA: Insufficient documentation

## 2014-02-10 DIAGNOSIS — R079 Chest pain, unspecified: Secondary | ICD-10-CM | POA: Insufficient documentation

## 2014-02-10 DIAGNOSIS — Z87891 Personal history of nicotine dependence: Secondary | ICD-10-CM | POA: Insufficient documentation

## 2014-02-10 MED ORDER — AMINOPHYLLINE 25 MG/ML IV SOLN
75.0000 mg | Freq: Once | INTRAVENOUS | Status: AC
  Administered 2014-02-10: 75 mg via INTRAVENOUS

## 2014-02-10 MED ORDER — TECHNETIUM TC 99M SESTAMIBI GENERIC - CARDIOLITE
10.3000 | Freq: Once | INTRAVENOUS | Status: AC | PRN
  Administered 2014-02-10: 10 via INTRAVENOUS

## 2014-02-10 MED ORDER — REGADENOSON 0.4 MG/5ML IV SOLN
0.4000 mg | Freq: Once | INTRAVENOUS | Status: AC
  Administered 2014-02-10: 0.4 mg via INTRAVENOUS

## 2014-02-10 MED ORDER — TECHNETIUM TC 99M SESTAMIBI GENERIC - CARDIOLITE
30.1000 | Freq: Once | INTRAVENOUS | Status: AC | PRN
  Administered 2014-02-10: 30.1 via INTRAVENOUS

## 2014-02-10 NOTE — Procedures (Addendum)
[facility] [address] [place] [address] [phone]  Cardiology Nuclear Med Study  Geoffrey Frank is a 80 y.o. male     MRN : [id number]     DOB: [birth year]  Procedure Date: 02/10/2014  Nuclear Med Background Indication for Stress Test:  Evaluation for Ischemia History:  COPD;Last NUC MPI on 04/18/2011-nonischemic;ECHO on 01/21/2013-EF=55-60% Cardiac Risk Factors: History of Smoking and Hypertension  Symptoms:  Chest Pain, Light-Headedness and Palpitations   Nuclear Pre-Procedure Caffeine/Decaff Intake:  1:00am NPO After: 11am   IV Site: R Hand  IV 0.9% NS with Angio Cath:  22g  Chest Size (in):  44"  IV Started by: Rolene Course, RN  Height: 6' (1.829 m)  Cup Size: n/a  BMI:  Body mass index is 25.49 kg/(m^2). Weight:  188 lb (85.276 kg)   Tech Comments:  n/a    Nuclear Med Study 1 or 2 day study: 1 day  Stress Test Type:  [place] Provider:  Lyman Bishop, MD   Resting Radionuclide: Technetium 62m Sestamibi  Resting Radionuclide Dose: 10.3 mCi   Stress Radionuclide:  Technetium 52m Sestamibi  Stress Radionuclide Dose: 30.1 mCi           Stress Protocol Rest HR: 74 Stress HR: 98  Rest BP: 175/101 Stress BP: 175/101  Exercise Time (min): n/a METS: n/a          Dose of Adenosine (mg):  n/a Dose of Lexiscan: 0.4 mg  Dose of Atropine (mg): n/a Dose of Dobutamine: n/a mcg/kg/min (at max HR)  Stress Test Technologist: Mellody Memos, CCT Nuclear Technologist: Imagene Riches, CNMT   Rest Procedure:  Myocardial perfusion imaging was performed at rest 45 minutes following the intravenous administration of Technetium 33m Sestamibi. Stress Procedure:  The patient received IV Lexiscan 0.4 mg over 15-seconds.  Technetium 57m Sestamibi injected at 30-seconds.  Patient experienced shortness of breath, stomach discomfort and was administered 75 mg of Aminophylline IV at 5 minutes. There were no  significant changes with Lexiscan.  Quantitative spect images were obtained after a 45 minute delay.  Transient Ischemic Dilatation (Normal <1.22):  1.16  QGS EDV:  136 ml QGS ESV:  69 ml LV Ejection Fraction: 49%  Rest ECG: NSR - Normal EKG  Stress ECG: There are scattered PVCs.  QPS Raw Data Images:  Normal; no motion artifact; normal heart/lung ratio. Stress Images:  Decreased inferoapical activity Rest Images:  Decreased inferoapical uptake Subtraction (SDS):  No evidence of ischemia.  Impression Exercise Capacity:  Lexiscan with no exercise. BP Response:  Hypotensive blood pressure response. Clinical Symptoms:  No significant symptoms noted. ECG Impression:  No significant ECG changes with Lexiscan. Comparison with Prior Nuclear Study: No significant change from previous study  Overall Impression:  Low risk stress nuclear study with fixed inferoapical defect..  LV Wall Motion:  Mild inferoapical hypokinesis - EF 49%.  Pixie Casino, MD, [facility] Board Certified in Nuclear Cardiology Attending Cardiologist [place]  Pixie Casino, MD  02/10/2014 5:12 PM

## 2014-03-07 ENCOUNTER — Encounter: Payer: Self-pay | Admitting: Internal Medicine

## 2014-03-07 ENCOUNTER — Ambulatory Visit (INDEPENDENT_AMBULATORY_CARE_PROVIDER_SITE_OTHER): Payer: Medicare PPO | Admitting: Internal Medicine

## 2014-03-07 VITALS — BP 160/87 | HR 85 | Ht 72.0 in | Wt 192.3 lb

## 2014-03-07 DIAGNOSIS — R079 Chest pain, unspecified: Secondary | ICD-10-CM

## 2014-03-07 DIAGNOSIS — I10 Essential (primary) hypertension: Secondary | ICD-10-CM

## 2014-03-07 NOTE — Patient Instructions (Signed)
Return in 1 year. Our office will contact you.
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
## 2014-03-10 NOTE — Telephone Encounter (Signed)
Encounter complete. 

## 2014-03-25 ENCOUNTER — Encounter (HOSPITAL_COMMUNITY): Payer: Self-pay | Admitting: Emergency Medicine

## 2014-03-25 ENCOUNTER — Emergency Department (HOSPITAL_COMMUNITY): Payer: Medicare PPO

## 2014-03-25 ENCOUNTER — Emergency Department (HOSPITAL_COMMUNITY)
Admission: EM | Admit: 2014-03-25 | Discharge: 2014-03-25 | Disposition: A | Discharge status: Home / Self Care | Payer: Medicare PPO | Attending: Emergency Medicine | Admitting: Emergency Medicine

## 2014-03-25 DIAGNOSIS — Z79899 Other long term (current) drug therapy: Secondary | ICD-10-CM | POA: Insufficient documentation

## 2014-03-25 DIAGNOSIS — IMO0002 Reserved for concepts with insufficient information to code with codable children: Secondary | ICD-10-CM | POA: Insufficient documentation

## 2014-03-25 DIAGNOSIS — Z8546 Personal history of malignant neoplasm of prostate: Secondary | ICD-10-CM | POA: Insufficient documentation

## 2014-03-25 DIAGNOSIS — J4489 Other specified chronic obstructive pulmonary disease: Secondary | ICD-10-CM | POA: Insufficient documentation

## 2014-03-25 DIAGNOSIS — I10 Essential (primary) hypertension: Secondary | ICD-10-CM | POA: Insufficient documentation

## 2014-03-25 DIAGNOSIS — M7021 Olecranon bursitis, right elbow: Secondary | ICD-10-CM

## 2014-03-25 DIAGNOSIS — Z87828 Personal history of other (healed) physical injury and trauma: Secondary | ICD-10-CM | POA: Insufficient documentation

## 2014-03-25 DIAGNOSIS — M7020 Olecranon bursitis, unspecified elbow: Secondary | ICD-10-CM | POA: Insufficient documentation

## 2014-03-25 DIAGNOSIS — Z8739 Personal history of other diseases of the musculoskeletal system and connective tissue: Secondary | ICD-10-CM | POA: Insufficient documentation

## 2014-03-25 DIAGNOSIS — J449 Chronic obstructive pulmonary disease, unspecified: Secondary | ICD-10-CM | POA: Insufficient documentation

## 2014-03-25 DIAGNOSIS — Z7982 Long term (current) use of aspirin: Secondary | ICD-10-CM | POA: Insufficient documentation

## 2014-03-25 DIAGNOSIS — Z791 Long term (current) use of non-steroidal anti-inflammatories (NSAID): Secondary | ICD-10-CM | POA: Insufficient documentation

## 2014-03-25 DIAGNOSIS — Z8551 Personal history of malignant neoplasm of bladder: Secondary | ICD-10-CM | POA: Insufficient documentation

## 2014-03-25 DIAGNOSIS — Z87891 Personal history of nicotine dependence: Secondary | ICD-10-CM | POA: Insufficient documentation

## 2014-03-25 LAB — CBG MONITORING, ED: Glucose-Capillary: 117 mg/dL — ABNORMAL HIGH (ref 70–99)

## 2014-03-25 MED ORDER — CLINDAMYCIN HCL 300 MG PO CAPS: 300.0000 mg | ORAL_CAPSULE | Freq: Three times a day (TID) | ORAL | Status: DC

## 2014-03-25 MED ORDER — CLINDAMYCIN HCL 300 MG PO CAPS
300.0000 mg | ORAL_CAPSULE | Freq: Once | ORAL | Status: AC
  Administered 2014-03-25: 300 mg via ORAL
  Filled 2014-03-25: qty 1

## 2014-03-25 NOTE — Discharge Instructions (Signed)
Take clinda for a week.   Take ibuprofen for pain.   Follow up with Dr. Lorin Mercy. He may need to drain it or do surgery on it.   Return to ER if you have fever, pain in the elbow pain, worse swelling.

## 2014-03-25 NOTE — ED Notes (Signed)
Pt c/o swelling to right elbow onset last night. Pt denies recent injury. Reports that he was outside yesterday watching people work on [employer]. Pt has full motion of his elbow.
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
## 2014-04-14 ENCOUNTER — Telehealth: Payer: Self-pay | Admitting: Internal Medicine

## 2014-04-14 NOTE — Telephone Encounter (Signed)
Dr. Lorin Mercy would like to due elbow surgery on [name] and [name] stated that she will be sending over a surgical clearance request..Please call  Thanks

## 2014-04-14 NOTE — Telephone Encounter (Signed)
Will forward for dr hilty review

## 2014-04-15 ENCOUNTER — Encounter: Payer: Self-pay | Admitting: *Deleted

## 2014-04-15 NOTE — Telephone Encounter (Signed)
Faxed surgical clearance - low risk - to Dr. Lorin Mercy (fax # [phone])

## 2014-04-15 NOTE — Telephone Encounter (Signed)
Recent negative stress test. Cleared for surgery and low risk. Eliezer Lofts, can you send a clearance to Dr. Lorin Mercy for me?  Thanks.  Dr. Lemmie Evens

## 2014-10-11 DIAGNOSIS — H11159 Pinguecula, unspecified eye: Secondary | ICD-10-CM | POA: Diagnosis not present

## 2014-10-11 DIAGNOSIS — H25099 Other age-related incipient cataract, unspecified eye: Secondary | ICD-10-CM | POA: Diagnosis not present

## 2014-10-11 DIAGNOSIS — H18419 Arcus senilis, unspecified eye: Secondary | ICD-10-CM | POA: Diagnosis not present

## 2014-10-11 DIAGNOSIS — I10 Essential (primary) hypertension: Secondary | ICD-10-CM | POA: Diagnosis not present

## 2014-12-14 ENCOUNTER — Telehealth: Payer: Self-pay | Admitting: Internal Medicine

## 2014-12-15 NOTE — Telephone Encounter (Signed)
Close encounter 

## 2014-12-20 ENCOUNTER — Telehealth: Payer: Self-pay | Admitting: Internal Medicine

## 2014-12-21 NOTE — Telephone Encounter (Signed)
Close encounter 

## 2015-03-08 ENCOUNTER — Ambulatory Visit (INDEPENDENT_AMBULATORY_CARE_PROVIDER_SITE_OTHER): Payer: Medicare PPO | Admitting: Internal Medicine

## 2015-03-08 ENCOUNTER — Encounter: Payer: Self-pay | Admitting: Internal Medicine

## 2015-03-08 VITALS — BP 136/86 | HR 75 | Ht 72.0 in | Wt 180.0 lb

## 2015-03-08 DIAGNOSIS — R040 Epistaxis: Secondary | ICD-10-CM

## 2015-03-08 DIAGNOSIS — J310 Chronic rhinitis: Secondary | ICD-10-CM

## 2015-03-08 DIAGNOSIS — I10 Essential (primary) hypertension: Secondary | ICD-10-CM
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
## 2015-03-08 NOTE — Patient Instructions (Signed)
Use nasal saline to moisten nasal passages. If you continue to have frequent nosebleeds, please contact your primary care doctor.  RESTART ASPIRIN.  Dr Debara Pickett recommends that you schedule a follow-up appointment in 1 year. You will receive a reminder letter in the mail two months in advance. If you don't receive a letter, please call our office to schedule the follow-up appointment.

## 2015-04-21 DIAGNOSIS — C670 Malignant neoplasm of trigone of bladder: Secondary | ICD-10-CM | POA: Diagnosis not present

## 2015-04-21 DIAGNOSIS — C672 Malignant neoplasm of lateral wall of bladder: Secondary | ICD-10-CM | POA: Diagnosis not present

## 2015-04-21 DIAGNOSIS — C61 Malignant neoplasm of prostate: Secondary | ICD-10-CM | POA: Diagnosis not present

## 2015-04-27 DIAGNOSIS — E559 Vitamin D deficiency, unspecified: Secondary | ICD-10-CM | POA: Diagnosis not present

## 2015-04-27 DIAGNOSIS — Z0000 Encounter for general adult medical examination without abnormal findings: Secondary | ICD-10-CM | POA: Diagnosis not present

## 2015-04-27 DIAGNOSIS — I129 Hypertensive chronic kidney disease with stage 1 through stage 4 chronic kidney disease, or unspecified chronic kidney disease: Secondary | ICD-10-CM | POA: Diagnosis not present

## 2015-04-27 DIAGNOSIS — R7309 Other abnormal glucose: Secondary | ICD-10-CM | POA: Diagnosis not present

## 2015-04-27 DIAGNOSIS — R413 Other amnesia: Secondary | ICD-10-CM | POA: Diagnosis not present

## 2015-04-27 DIAGNOSIS — R634 Abnormal weight loss: Secondary | ICD-10-CM | POA: Diagnosis not present

## 2015-04-27 DIAGNOSIS — N182 Chronic kidney disease, stage 2 (mild): Secondary | ICD-10-CM | POA: Diagnosis not present

## 2015-04-27 DIAGNOSIS — I119 Hypertensive heart disease without heart failure: Secondary | ICD-10-CM | POA: Diagnosis not present

## 2015-04-27 DIAGNOSIS — Z1389 Encounter for screening for other disorder: Secondary | ICD-10-CM | POA: Diagnosis not present

## 2015-04-27 DIAGNOSIS — D649 Anemia, unspecified: Secondary | ICD-10-CM | POA: Diagnosis not present

## 2015-04-27 DIAGNOSIS — J449 Chronic obstructive pulmonary disease, unspecified: Secondary | ICD-10-CM | POA: Diagnosis not present

## 2015-05-30 ENCOUNTER — Encounter: Payer: Self-pay | Admitting: *Deleted

## 2015-05-30 ENCOUNTER — Encounter: Payer: Medicare PPO | Attending: Internal Medicine | Admitting: *Deleted

## 2015-05-30 VITALS — Ht 72.0 in | Wt 181.7 lb

## 2015-05-30 DIAGNOSIS — Z713 Dietary counseling and surveillance: Secondary | ICD-10-CM | POA: Diagnosis not present

## 2015-05-30 DIAGNOSIS — E119 Type 2 diabetes mellitus without complications: Secondary | ICD-10-CM | POA: Diagnosis not present

## 2015-05-30 NOTE — Patient Instructions (Addendum)
Plan:  Aim for 2-3 Carb Choices per meal (30-45 grams) +/- 1 either way  Aim for 0-15 Carbs per snack if hungry  Include protein in moderation with your meals and snacks Consider reading food labels for Total Carbohydrate and Fat Grams of foods Consider  increasing your activity level daily as tolerated Continue taking medication as directed by MD  Stay away from the fruit juice........ Try Ocean Spray Cranberry Juice (cocktail)   Stay away from regular Coke (try diet coke)  Breakfast:1C cereal, 1C milk, piece of fruit or fruit cup (pour out the juice) Lunch: sandwich, diet coke, 5 vanilla wafers or 3 small graham crackers Dinner: veggie salad, [place], dressing, cranberry sauce, broccoli - watch your portions

## 2015-05-30 NOTE — Progress Notes (Signed)
Diabetes Self-Management Education  Visit Type: First/Initial  Appt. Start Time: 1400 Appt. End Time: 6195  05/30/2015  [name], identified by name and date of birth, is a 81 y.o. male with a new diagnosis of Diabetes: Type 2. In review of his dietary intake there are many opportunities for improvement.   ASSESSMENT  Height 6' (1.829 m), weight 181 lb 11.2 oz (82.419 kg). Body mass index is 24.64 kg/(m^2).      Diabetes Self-Management Education - 05/30/15 1440    Visit Information   Visit Type First/Initial   Initial Visit   Diabetes Type Type 2   Are you currently following a meal plan? No   Are you taking your medications as prescribed? Yes   Date Diagnosed 04/2015   Health Coping   How would you rate your overall health? Good   Psychosocial Assessment   Patient Belief/Attitude about Diabetes Motivated to manage diabetes   Self-care barriers Unsteady gait/risk for falls   Self-management support Doctor's office;Church;CDE visits   Other persons present Patient   Patient Concerns Nutrition/Meal planning;Glycemic Control   Special Needs None   How often do you need to have someone help you when you read instructions, pamphlets, or other written materials from your doctor or pharmacy? 1 - Never   Complications   Last HgB A1C per patient/outside source --  not available   How often do you check your blood sugar? 0 times/day (not testing)   Have you had a dilated eye exam in the past 12 months? Yes   Have you had a dental exam in the past 12 months? No  dentures   Are you checking your feet? No   Dietary Intake   Breakfast mix honey bunch of oats & Raisin bran, fruit cup, milk, toast multi crain, granberry juice   Lunch [place] sandwich, pastrami,  lettuce, tomato, 1/2 cup regular coke, fruit cup / [employer] hamburger   Dinner canned soup, [place] toast, fruit, apple pie   Beverage(s) cranberry juice, regular coke, apple juice, 3-4 glasses water,   Exercise   Exercise Type ADL's   How many days per week to you exercise? 0   How many minutes per day do you exercise? 0   Total minutes per week of exercise 0   Patient Education   Previous Diabetes Education No   Disease state  Definition of diabetes, type 1 and 2, and the diagnosis of diabetes   Nutrition management  Role of diet in the treatment of diabetes and the relationship between the three main macronutrients and blood glucose level;Food label reading, portion sizes and measuring food.;Carbohydrate counting;Information on hints to eating out and maintain blood glucose control.   Monitoring Yearly dilated eye exam;Daily foot exams   Chronic complications Relationship between chronic complications and blood glucose control;Assessed and discussed foot care and prevention of foot problems;Dental care;Retinopathy and reason for yearly dilated eye exams   Individualized Goals (developed by patient)   Nutrition General guidelines for healthy choices and portions discussed   Physical Activity Not Applicable   Reducing Risk do foot checks daily;increase portions of nuts and seeds   Outcomes   Expected Outcomes Demonstrated interest in learning. Expect positive outcomes   Future DMSE PRN   Program Status Completed      Individualized Plan for Diabetes Self-Management Training:   Learning Objective:  Patient will have a greater understanding of diabetes self-management. Patient education plan is to attend individual and/or group sessions per assessed needs and concerns.  Plan:   Patient Instructions  Plan:  Aim for 2-3 Carb Choices per meal (30-45 grams) +/- 1 either way  Aim for 0-15 Carbs per snack if hungry  Include protein in moderation with your meals and snacks Consider reading food labels for Total Carbohydrate and Fat Grams of foods Consider  increasing your activity level daily as tolerated Continue taking medication as directed by MD  Stay away from the fruit juice........ Try  Ocean Spray Cranberry Juice (cocktail)   Stay away from regular Coke (try diet coke)  Breakfast:1C cereal, 1C milk, piece of fruit or fruit cup (pour out the juice) Lunch: sandwich, diet coke, 5 vanilla wafers or 3 small graham crackers Dinner: veggie salad, [place], dressing, cranberry sauce, broccoli - watch your portions    Expected Outcomes:  Demonstrated interest in learning. Expect positive outcomes  Education material provided: Living Well with Diabetes, A1C conversion sheet, Meal plan card, My Plate and Snack sheet  If problems or questions, patient to contact team via:  Phone  Future DSME appointment: PRN

## 2015-05-31 DIAGNOSIS — M545 Low back pain: Secondary | ICD-10-CM | POA: Diagnosis not present

## 2015-06-01 ENCOUNTER — Other Ambulatory Visit: Payer: Self-pay | Admitting: Orthopaedic Surgery

## 2015-06-01 DIAGNOSIS — M545 Low back pain: Secondary | ICD-10-CM

## 2015-06-17 ENCOUNTER — Ambulatory Visit
Admission: RE | Admit: 2015-06-17 | Discharge: 2015-06-17 | Disposition: A | Discharge status: Home / Self Care | Payer: Medicare PPO | Source: Ambulatory Visit | Attending: Orthopaedic Surgery | Admitting: Orthopaedic Surgery

## 2015-06-17 DIAGNOSIS — M545 Low back pain: Secondary | ICD-10-CM

## 2015-06-27 DIAGNOSIS — M545 Low back pain: Secondary | ICD-10-CM | POA: Diagnosis not present

## 2015-06-30 ENCOUNTER — Other Ambulatory Visit: Payer: Self-pay | Admitting: Orthopaedic Surgery

## 2015-06-30 DIAGNOSIS — M545 Low back pain: Secondary | ICD-10-CM

## 2015-07-06 ENCOUNTER — Ambulatory Visit
Admission: RE | Admit: 2015-07-06 | Discharge: 2015-07-06 | Disposition: A | Discharge status: Home / Self Care | Payer: Medicare PPO | Source: Ambulatory Visit | Attending: Orthopaedic Surgery | Admitting: Orthopaedic Surgery

## 2015-07-06 DIAGNOSIS — M5126 Other intervertebral disc displacement, lumbar region: Secondary | ICD-10-CM | POA: Diagnosis not present

## 2015-07-06 DIAGNOSIS — M545 Low back pain: Secondary | ICD-10-CM

## 2015-07-18 DIAGNOSIS — M545 Low back pain: Secondary | ICD-10-CM | POA: Diagnosis not present

## 2015-08-23 DIAGNOSIS — E119 Type 2 diabetes mellitus without complications: Secondary | ICD-10-CM | POA: Diagnosis not present

## 2015-08-23 DIAGNOSIS — J029 Acute pharyngitis, unspecified: Secondary | ICD-10-CM | POA: Diagnosis not present

## 2015-08-23 DIAGNOSIS — I10 Essential (primary) hypertension: Secondary | ICD-10-CM | POA: Diagnosis not present

## 2015-09-08 DIAGNOSIS — Z8551 Personal history of malignant neoplasm of bladder: Secondary | ICD-10-CM | POA: Diagnosis not present

## 2015-09-08 DIAGNOSIS — C61 Malignant neoplasm of prostate: Secondary | ICD-10-CM | POA: Diagnosis not present

## 2015-09-15 ENCOUNTER — Other Ambulatory Visit (HOSPITAL_COMMUNITY): Payer: Self-pay | Admitting: Urology

## 2015-09-15 DIAGNOSIS — C61 Malignant neoplasm of prostate: Secondary | ICD-10-CM

## 2015-09-22 ENCOUNTER — Encounter (HOSPITAL_COMMUNITY)
Admission: RE | Admit: 2015-09-22 | Discharge: 2015-09-22 | Disposition: A | Discharge status: Home / Self Care | Payer: Medicare Other | Source: Ambulatory Visit | Attending: Urology | Admitting: Urology

## 2015-09-22 DIAGNOSIS — C61 Malignant neoplasm of prostate: Secondary | ICD-10-CM | POA: Insufficient documentation

## 2015-09-22 DIAGNOSIS — R9721 Rising PSA following treatment for malignant neoplasm of prostate: Secondary | ICD-10-CM | POA: Insufficient documentation

## 2015-09-22 DIAGNOSIS — Z96642 Presence of left artificial hip joint: Secondary | ICD-10-CM | POA: Insufficient documentation

## 2015-09-22 MED ORDER — TECHNETIUM TC 99M MEDRONATE IV KIT
24.3000 | PACK | Freq: Once | INTRAVENOUS | Status: AC | PRN
  Administered 2015-09-22: 24.3 via INTRAVENOUS

## 2015-10-06 ENCOUNTER — Emergency Department (HOSPITAL_COMMUNITY)
Admission: EM | Admit: 2015-10-06 | Discharge: 2015-10-06 | Disposition: A | Discharge status: Home / Self Care | Payer: Medicare Other | Attending: Emergency Medicine | Admitting: Emergency Medicine

## 2015-10-06 ENCOUNTER — Encounter (HOSPITAL_COMMUNITY): Payer: Self-pay | Admitting: Family Medicine

## 2015-10-06 DIAGNOSIS — I10 Essential (primary) hypertension: Secondary | ICD-10-CM | POA: Insufficient documentation

## 2015-10-06 DIAGNOSIS — Z8551 Personal history of malignant neoplasm of bladder: Secondary | ICD-10-CM | POA: Insufficient documentation

## 2015-10-06 DIAGNOSIS — Z7982 Long term (current) use of aspirin: Secondary | ICD-10-CM | POA: Diagnosis not present

## 2015-10-06 DIAGNOSIS — E119 Type 2 diabetes mellitus without complications: Secondary | ICD-10-CM | POA: Diagnosis not present

## 2015-10-06 DIAGNOSIS — Z87891 Personal history of nicotine dependence: Secondary | ICD-10-CM | POA: Insufficient documentation

## 2015-10-06 DIAGNOSIS — K5900 Constipation, unspecified: Secondary | ICD-10-CM | POA: Insufficient documentation

## 2015-10-06 DIAGNOSIS — J449 Chronic obstructive pulmonary disease, unspecified: Secondary | ICD-10-CM | POA: Insufficient documentation

## 2015-10-06 DIAGNOSIS — Z8739 Personal history of other diseases of the musculoskeletal system and connective tissue: Secondary | ICD-10-CM | POA: Insufficient documentation

## 2015-10-06 DIAGNOSIS — Z7951 Long term (current) use of inhaled steroids: Secondary | ICD-10-CM | POA: Insufficient documentation

## 2015-10-06 DIAGNOSIS — Z8546 Personal history of malignant neoplasm of prostate: Secondary | ICD-10-CM | POA: Insufficient documentation

## 2015-10-06 MED ORDER — POLYETHYLENE GLYCOL 3350 17 GM/SCOOP PO POWD: 17.0000 g | Freq: Every day | ORAL | Status: DC | PRN

## 2015-10-06 MED ORDER — DOCUSATE SODIUM 100 MG PO CAPS: 100.0000 mg | ORAL_CAPSULE | Freq: Two times a day (BID) | ORAL | Status: DC

## 2015-10-06 NOTE — ED Notes (Signed)
Pt here for constipation. sts that he can feel it in his rectum but it wont come out. sts small BM Wednesday. Denies abd pain
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
## 2015-10-06 NOTE — Discharge Instructions (Signed)

## 2015-12-18 ENCOUNTER — Encounter: Payer: Self-pay | Admitting: Gastroenterology

## 2016-02-14 ENCOUNTER — Other Ambulatory Visit (INDEPENDENT_AMBULATORY_CARE_PROVIDER_SITE_OTHER): Payer: Medicare Other

## 2016-02-14 ENCOUNTER — Encounter: Payer: Self-pay | Admitting: Gastroenterology

## 2016-02-14 ENCOUNTER — Ambulatory Visit (INDEPENDENT_AMBULATORY_CARE_PROVIDER_SITE_OTHER): Payer: Medicare Other | Admitting: Gastroenterology

## 2016-02-14 VITALS — BP 120/74 | HR 128 | Ht 69.0 in | Wt 174.5 lb

## 2016-02-14 DIAGNOSIS — R194 Change in bowel habit: Secondary | ICD-10-CM | POA: Diagnosis not present

## 2016-02-14 DIAGNOSIS — R634 Abnormal weight loss: Secondary | ICD-10-CM | POA: Diagnosis not present

## 2016-02-14 DIAGNOSIS — R195 Other fecal abnormalities: Secondary | ICD-10-CM | POA: Diagnosis not present

## 2016-02-14 LAB — CBC WITH DIFFERENTIAL/PLATELET
Basophils Absolute: 0 10*3/uL (ref 0.0–0.1)
Basophils Relative: 0.2 % (ref 0.0–3.0)
Eosinophils Absolute: 0.1 10*3/uL (ref 0.0–0.7)
Eosinophils Relative: 0.8 % (ref 0.0–5.0)
HCT: 38.9 % — ABNORMAL LOW (ref 39.0–52.0)
Hemoglobin: 12.3 g/dL — ABNORMAL LOW (ref 13.0–17.0)
Lymphocytes Relative: 8.3 % — ABNORMAL LOW (ref 12.0–46.0)
Lymphs Abs: 0.8 10*3/uL (ref 0.7–4.0)
MCHC: 31.5 g/dL (ref 30.0–36.0)
MCV: 71.9 fl — ABNORMAL LOW (ref 78.0–100.0)
Monocytes Absolute: 0.8 10*3/uL (ref 0.1–1.0)
Monocytes Relative: 8.5 % (ref 3.0–12.0)
Neutro Abs: 7.7 10*3/uL (ref 1.4–7.7)
Neutrophils Relative %: 82.2 % — ABNORMAL HIGH (ref 43.0–77.0)
Platelets: 240 10*3/uL (ref 150.0–400.0)
RBC: 5.41 Mil/uL (ref 4.22–5.81)
RDW: 15.8 % — ABNORMAL HIGH (ref 11.5–15.5)
WBC: 9.4 10*3/uL (ref 4.0–10.5)

## 2016-02-14 LAB — BASIC METABOLIC PANEL
BUN: 17 mg/dL (ref 6–23)
CO2: 27 mEq/L (ref 19–32)
Calcium: 9.4 mg/dL (ref 8.4–10.5)
Chloride: 104 mEq/L (ref 96–112)
Creatinine, Ser: 0.88 mg/dL (ref 0.40–1.50)
GFR: 105.09 mL/min (ref >60.00)
Glucose, Bld: 113 mg/dL — ABNORMAL HIGH (ref 70–99)
Potassium: 4.5 mEq/L (ref 3.5–5.1)
Sodium: 138 mEq/L (ref 135–145)

## 2016-02-14 LAB — TSH: TSH: 2.08 u[IU]/mL (ref 0.35–4.50)

## 2016-02-14 LAB — HEPATIC FUNCTION PANEL
ALT: 13 U/L (ref 0–53)
AST: 17 U/L (ref 0–37)
Albumin: 4.1 g/dL (ref 3.5–5.2)
Alkaline Phosphatase: 42 U/L (ref 39–117)
Bilirubin, Direct: 0.2 mg/dL (ref 0.0–0.3)
Total Bilirubin: 1.1 mg/dL (ref 0.2–1.2)
Total Protein: 6.9 g/dL (ref 6.0–8.3)

## 2016-02-14 NOTE — Patient Instructions (Signed)
Your physician has requested that you go to the basement for lab work before leaving today.  Please call our office after your appointment with Dr. Debara Pickett to discuss scheduling your Colonoscopy. We need clearance from Dr. Debara Pickett before scheduling.   You can take over the counter Miralax 1-2 x daily for constipation.   Thank you for choosing me and [facility].  Pricilla Riffle. Dagoberto Ligas., MD., [facility]
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
## 2016-02-15 ENCOUNTER — Other Ambulatory Visit (INDEPENDENT_AMBULATORY_CARE_PROVIDER_SITE_OTHER): Payer: Medicare Other

## 2016-02-15 DIAGNOSIS — R195 Other fecal abnormalities: Secondary | ICD-10-CM | POA: Diagnosis not present

## 2016-02-15 LAB — VITAMIN B12: Vitamin B-12: 259 pg/mL (ref 211–911)

## 2016-02-15 LAB — IRON: Iron: 19 ug/dL — ABNORMAL LOW (ref 42–165)

## 2016-02-15 LAB — IBC PANEL
Iron: 22 ug/dL — ABNORMAL LOW (ref 42–165)
Saturation Ratios: 5.8 % — ABNORMAL LOW (ref 20.0–50.0)
Transferrin: 273 mg/dL (ref 212.0–360.0)

## 2016-02-15 LAB — FERRITIN: Ferritin: 44.9 ng/mL (ref 22.0–322.0)

## 2016-02-15 LAB — FOLATE: Folate: >23.7 ng/mL (ref >5.9)

## 2016-02-16 ENCOUNTER — Telehealth: Payer: Self-pay | Admitting: Gastroenterology

## 2016-02-16 ENCOUNTER — Other Ambulatory Visit: Payer: Self-pay | Admitting: *Deleted

## 2016-02-16 MED ORDER — FERROUS SULFATE 325 (65 FE) MG PO TABS: 325.0000 mg | ORAL_TABLET | Freq: Two times a day (BID) | ORAL | Status: DC

## 2016-02-16 NOTE — Telephone Encounter (Signed)
See results note. 

## 2016-03-15 ENCOUNTER — Emergency Department (HOSPITAL_COMMUNITY)
Admission: EM | Admit: 2016-03-15 | Discharge: 2016-03-15 | Disposition: A | Discharge status: Home / Self Care | Payer: Medicare Other | Attending: Emergency Medicine | Admitting: Emergency Medicine

## 2016-03-15 ENCOUNTER — Emergency Department (HOSPITAL_COMMUNITY): Payer: Medicare Other

## 2016-03-15 ENCOUNTER — Encounter (HOSPITAL_COMMUNITY): Payer: Self-pay | Admitting: Emergency Medicine

## 2016-03-15 DIAGNOSIS — E119 Type 2 diabetes mellitus without complications: Secondary | ICD-10-CM | POA: Diagnosis not present

## 2016-03-15 DIAGNOSIS — F329 Major depressive disorder, single episode, unspecified: Secondary | ICD-10-CM | POA: Insufficient documentation

## 2016-03-15 DIAGNOSIS — Z8679 Personal history of other diseases of the circulatory system: Secondary | ICD-10-CM | POA: Insufficient documentation

## 2016-03-15 DIAGNOSIS — J449 Chronic obstructive pulmonary disease, unspecified: Secondary | ICD-10-CM | POA: Insufficient documentation

## 2016-03-15 DIAGNOSIS — Z87891 Personal history of nicotine dependence: Secondary | ICD-10-CM | POA: Diagnosis not present

## 2016-03-15 DIAGNOSIS — M1990 Unspecified osteoarthritis, unspecified site: Secondary | ICD-10-CM | POA: Diagnosis not present

## 2016-03-15 DIAGNOSIS — K5900 Constipation, unspecified: Secondary | ICD-10-CM | POA: Diagnosis not present

## 2016-03-15 DIAGNOSIS — M25562 Pain in left knee: Secondary | ICD-10-CM | POA: Diagnosis present

## 2016-03-15 DIAGNOSIS — Z79899 Other long term (current) drug therapy: Secondary | ICD-10-CM | POA: Diagnosis not present

## 2016-03-15 DIAGNOSIS — Z7982 Long term (current) use of aspirin: Secondary | ICD-10-CM | POA: Diagnosis not present

## 2016-03-15 DIAGNOSIS — Z8551 Personal history of malignant neoplasm of bladder: Secondary | ICD-10-CM | POA: Insufficient documentation

## 2016-03-15 DIAGNOSIS — Z96642 Presence of left artificial hip joint: Secondary | ICD-10-CM | POA: Insufficient documentation

## 2016-03-15 DIAGNOSIS — Z8546 Personal history of malignant neoplasm of prostate: Secondary | ICD-10-CM | POA: Diagnosis not present

## 2016-03-15 DIAGNOSIS — I10 Essential (primary) hypertension: Secondary | ICD-10-CM | POA: Diagnosis not present

## 2016-03-15 NOTE — ED Notes (Addendum)
Pt reports intermittentacute chronic left knee pain; denies swelling, temperature change, or injury.

## 2016-03-15 NOTE — Discharge Instructions (Signed)
Read the information below. Your x-ray showed no fracture or dislocation, the imaging did show some chronic changes. You were given a knee sleeve in the ED. Wear as needed for pain relief and stabilization. I encourage you to wear during your exercise class. Complete activity as tolerated. Ice affected area for 20 minute increments following activity. You can take tylenol 650mg  every 6hrs or motrin 400mg  every 6hrs for pain and inflammation control.  I encourage you to call and follow up with your Orthopedic on Monday for further evaluation.  You may return to the Emergency Department at any time for worsening condition or any new symptoms that concern you. Return to ED if your symptoms worsen, you develop a fever, redness/warmth/swelling of affected joint, or unilateral leg swelling/pain.

## 2016-03-15 NOTE — ED Notes (Signed)
Applied Medium sleeve to L knee
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
## 2016-03-15 NOTE — ED Notes (Signed)
Ortho tech made aware of knee sleeve.

## 2016-03-25 ENCOUNTER — Ambulatory Visit (INDEPENDENT_AMBULATORY_CARE_PROVIDER_SITE_OTHER): Payer: Medicare Other | Admitting: Internal Medicine

## 2016-03-25 ENCOUNTER — Encounter: Payer: Self-pay | Admitting: Internal Medicine

## 2016-03-25 VITALS — BP 162/92 | HR 132 | Ht 72.0 in | Wt 174.8 lb

## 2016-03-25 DIAGNOSIS — J449 Chronic obstructive pulmonary disease, unspecified: Secondary | ICD-10-CM | POA: Diagnosis not present

## 2016-03-25 DIAGNOSIS — I471 Supraventricular tachycardia, unspecified: Secondary | ICD-10-CM | POA: Insufficient documentation

## 2016-03-25 DIAGNOSIS — I10 Essential (primary) hypertension: Secondary | ICD-10-CM | POA: Diagnosis not present

## 2016-03-25 MED ORDER — METOPROLOL TARTRATE 25 MG PO TABS: 25.0000 mg | ORAL_TABLET | Freq: Two times a day (BID) | ORAL | Status: DC
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
## 2016-03-25 NOTE — Patient Instructions (Addendum)
Your physician has requested that you have an echocardiogram @ [address] - [address]rd Floor. Echocardiography is a painless test that uses sound waves to create images of your heart. It provides your doctor with information about the size and shape of your heart and how well your heart's chambers and valves are working. This procedure takes approximately one hour. There are no restrictions for this procedure.  Your physician recommends that you schedule a follow-up appointment w/Dr. Debara Pickett after your echocardiogram (with EKG)  Your physician has recommended you make the following change in your medication: START metoprolol tartrate 25mg  twice daily

## 2016-04-10 ENCOUNTER — Ambulatory Visit (HOSPITAL_COMMUNITY): Payer: Medicare Other

## 2016-04-24 ENCOUNTER — Other Ambulatory Visit: Payer: Self-pay

## 2016-04-24 ENCOUNTER — Ambulatory Visit (HOSPITAL_COMMUNITY): Payer: Medicare Other | Attending: Cardiology

## 2016-04-24 DIAGNOSIS — I471 Supraventricular tachycardia: Secondary | ICD-10-CM | POA: Insufficient documentation

## 2016-04-24 DIAGNOSIS — I340 Nonrheumatic mitral (valve) insufficiency: Secondary | ICD-10-CM | POA: Diagnosis not present

## 2016-04-24 DIAGNOSIS — I119 Hypertensive heart disease without heart failure: Secondary | ICD-10-CM | POA: Insufficient documentation

## 2016-04-24 DIAGNOSIS — I071 Rheumatic tricuspid insufficiency: Secondary | ICD-10-CM | POA: Diagnosis not present

## 2016-04-24 DIAGNOSIS — I77810 Thoracic aortic ectasia: Secondary | ICD-10-CM | POA: Diagnosis not present

## 2016-04-24 DIAGNOSIS — I358 Other nonrheumatic aortic valve disorders: Secondary | ICD-10-CM | POA: Insufficient documentation

## 2016-05-02 ENCOUNTER — Ambulatory Visit (INDEPENDENT_AMBULATORY_CARE_PROVIDER_SITE_OTHER): Payer: Medicare Other | Admitting: Internal Medicine

## 2016-05-02 ENCOUNTER — Encounter: Payer: Self-pay | Admitting: Internal Medicine

## 2016-05-02 VITALS — BP 170/80 | HR 67 | Ht 72.0 in | Wt 174.4 lb

## 2016-05-02 DIAGNOSIS — I10 Essential (primary) hypertension: Secondary | ICD-10-CM

## 2016-05-02 DIAGNOSIS — I471 Supraventricular tachycardia: Secondary | ICD-10-CM | POA: Diagnosis not present
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
## 2016-05-02 NOTE — Patient Instructions (Signed)
Your physician recommends that you continue on your current medications as directed. Please refer to the Current Medication list given to you today.  Your physician wants you to follow-up in: 6 months with Dr. Hilty. You will receive a reminder letter in the mail two months in advance. If you don't receive a letter, please call our office to schedule the follow-up appointment.  

## 2016-10-10 ENCOUNTER — Telehealth: Payer: Self-pay | Admitting: Internal Medicine

## 2016-10-10 NOTE — Telephone Encounter (Signed)
Clearance routed via EPIC 

## 2016-10-10 NOTE — Telephone Encounter (Signed)
[facility] requests clearance for:  1. Type of surgery: cysto, bladder biopsy, possible transurethral resection of bladder tumor, possible Mitomycin C instillation 2. Date of surgery: TBD 3. Surgeon: Dr. Rana Snare 4. Medications that need to be held & how long: aspirin (request to hold at least 5 days prior) 5. Fax and/or Phone: (p) [phone]  (f) [phone]

## 2016-10-10 NOTE — Telephone Encounter (Signed)
Low to intermediate risk for surgery. Ok to hold aspirin for at least 7 days. Restart after.  Dr. Lemmie Evens

## 2016-10-18 ENCOUNTER — Encounter (HOSPITAL_COMMUNITY): Payer: Self-pay | Admitting: *Deleted

## 2016-10-18 ENCOUNTER — Other Ambulatory Visit: Payer: Self-pay | Admitting: Urology

## 2016-10-21 NOTE — Progress Notes (Signed)
03/25/2016-noted in EPIC- EKG 04/24/2016- noted in EPIC-ECHO.

## 2016-10-22 ENCOUNTER — Encounter (INDEPENDENT_AMBULATORY_CARE_PROVIDER_SITE_OTHER): Payer: Self-pay

## 2016-10-22 ENCOUNTER — Encounter (HOSPITAL_COMMUNITY)
Admission: RE | Admit: 2016-10-22 | Discharge: 2016-10-22 | Disposition: A | Discharge status: Home / Self Care | Payer: Medicare Other | Source: Ambulatory Visit | Attending: Urology | Admitting: Urology

## 2016-10-22 ENCOUNTER — Encounter (HOSPITAL_COMMUNITY): Payer: Self-pay

## 2016-10-22 DIAGNOSIS — C679 Malignant neoplasm of bladder, unspecified: Secondary | ICD-10-CM | POA: Diagnosis not present

## 2016-10-22 DIAGNOSIS — Z01818 Encounter for other preprocedural examination: Secondary | ICD-10-CM | POA: Insufficient documentation

## 2016-10-22 DIAGNOSIS — Z01812 Encounter for preprocedural laboratory examination: Secondary | ICD-10-CM | POA: Insufficient documentation

## 2016-10-22 HISTORY — DX: Prediabetes: R73.03

## 2016-10-22 LAB — CBC
HCT: 37.1 % — ABNORMAL LOW (ref 39.0–52.0)
Hemoglobin: 11.5 g/dL — ABNORMAL LOW (ref 13.0–17.0)
MCH: 22.5 pg — ABNORMAL LOW (ref 26.0–34.0)
MCHC: 31 g/dL (ref 30.0–36.0)
MCV: 72.7 fL — ABNORMAL LOW (ref 78.0–100.0)
Platelets: 218 10*3/uL (ref 150–400)
RBC: 5.1 MIL/uL (ref 4.22–5.81)
RDW: 15.3 % (ref 11.5–15.5)
WBC: 5.4 10*3/uL (ref 4.0–10.5)

## 2016-10-22 LAB — BASIC METABOLIC PANEL
Anion gap: 5 (ref 5–15)
BUN: 14 mg/dL (ref 6–20)
CO2: 27 mmol/L (ref 22–32)
Calcium: 9 mg/dL (ref 8.9–10.3)
Chloride: 107 mmol/L (ref 101–111)
Creatinine, Ser: 0.92 mg/dL (ref 0.61–1.24)
GFR calc Af Amer: >60 mL/min (ref >60)
GFR calc non Af Amer: >60 mL/min (ref >60)
Glucose, Bld: 111 mg/dL — ABNORMAL HIGH (ref 65–99)
Potassium: 4.4 mmol/L (ref 3.5–5.1)
Sodium: 139 mmol/L (ref 135–145)

## 2016-10-22 NOTE — Patient Instructions (Signed)
Geoffrey Frank  10/22/2016   Your procedure is scheduled on: Thursday 10/31/2016  Report to [facility] Main  Entrance take [place]  elevators to 3rd floor to  [employer] at  [place] AM.  Call this number if you have problems the morning of surgery [phone]   Remember: ONLY 1 PERSON MAY GO WITH YOU TO SHORT STAY TO GET  READY MORNING OF [place].   Do not eat food or drink liquids :After Midnight.     Take these medicines the morning of surgery with A SIP OF WATER: Metoprolol, use Qvar inhaler if needed                                  You may not have any metal on your body including hair pins and              piercings  Do not wear jewelry, make-up, lotions, powders or perfumes, deodorant             Do not wear nail polish.  Do not shave  48 hours prior to surgery.              Men may shave face and neck.   Do not bring valuables to the hospital. [place].  Contacts, dentures or bridgework may not be worn into surgery.  Leave suitcase in the car. After surgery it may be brought to your room.     Patients discharged the day of surgery will not be allowed to drive home.  Name and phone number of your driver:grandaughter- Geoffrey Frank  Special Instructions: N/A              Please read over the following fact sheets you were given: _____________________________________________________________________             [facility] - Preparing for Surgery Before surgery, you can play an important role.  Because skin is not sterile, your skin needs to be as free of germs as possible.  You can reduce the number of germs on your skin by washing with CHG (chlorahexidine gluconate) soap before surgery.  CHG is an antiseptic cleaner which kills germs and bonds with the skin to continue killing germs even after washing. Please DO NOT use if you have an allergy to CHG or antibacterial soaps.  If your skin  becomes reddened/irritated stop using the CHG and inform your nurse when you arrive at Short Stay. Do not shave (including legs and underarms) for at least 48 hours prior to the first CHG shower.  You may shave your face/neck. Please follow these instructions carefully:  1.  Shower with CHG Soap the night before surgery and the  morning of Surgery.  2.  If you choose to wash your hair, wash your hair first as usual with your  normal  shampoo.  3.  After you shampoo, rinse your hair and body thoroughly to remove the  shampoo.                           4.  Use CHG as you would any other liquid soap.  You can apply chg directly  to the skin and  wash                       Gently with a scrungie or clean washcloth.  5.  Apply the CHG Soap to your body ONLY FROM THE NECK DOWN.   Do not use on face/ open                           Wound or open sores. Avoid contact with eyes, ears mouth and genitals (private parts).                       Wash face,  Genitals (private parts) with your normal soap.             6.  Wash thoroughly, paying special attention to the area where your surgery  will be performed.  7.  Thoroughly rinse your body with warm water from the neck down.  8.  DO NOT shower/wash with your normal soap after using and rinsing off  the CHG Soap.                9.  Pat yourself dry with a clean towel.            10.  Wear clean pajamas.            11.  Place clean sheets on your bed the night of your first shower and do not  sleep with pets. Day of Surgery : Do not apply any lotions/deodorants the morning of surgery.  Please wear clean clothes to the hospital/surgery center.  FAILURE TO FOLLOW THESE INSTRUCTIONS MAY RESULT IN THE CANCELLATION OF YOUR SURGERY PATIENT SIGNATURE_________________________________  NURSE SIGNATURE__________________________________  ________________________________________________________________________

## 2016-10-23 LAB — HEMOGLOBIN A1C
Hgb A1c MFr Bld: 5.9 % — ABNORMAL HIGH (ref 4.8–5.6)
Mean Plasma Glucose: 123 mg/dL
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
## 2016-10-31 ENCOUNTER — Ambulatory Visit (HOSPITAL_COMMUNITY): Payer: Medicare Other | Admitting: Anesthesiology

## 2016-10-31 ENCOUNTER — Encounter (HOSPITAL_COMMUNITY)
Admission: RE | Disposition: A | Discharge status: Home / Self Care | Payer: Self-pay | Source: Ambulatory Visit | Attending: Urology

## 2016-10-31 ENCOUNTER — Encounter (HOSPITAL_COMMUNITY): Payer: Self-pay | Admitting: *Deleted

## 2016-10-31 ENCOUNTER — Ambulatory Visit (HOSPITAL_COMMUNITY)
Admission: RE | Admit: 2016-10-31 | Discharge: 2016-10-31 | Disposition: A | Discharge status: Home / Self Care | Payer: Medicare Other | Source: Ambulatory Visit | Attending: Urology | Admitting: Urology

## 2016-10-31 DIAGNOSIS — Z96643 Presence of artificial hip joint, bilateral: Secondary | ICD-10-CM | POA: Diagnosis not present

## 2016-10-31 DIAGNOSIS — N320 Bladder-neck obstruction: Secondary | ICD-10-CM | POA: Insufficient documentation

## 2016-10-31 DIAGNOSIS — Z8042 Family history of malignant neoplasm of prostate: Secondary | ICD-10-CM | POA: Insufficient documentation

## 2016-10-31 DIAGNOSIS — Z8546 Personal history of malignant neoplasm of prostate: Secondary | ICD-10-CM | POA: Diagnosis not present

## 2016-10-31 DIAGNOSIS — Z923 Personal history of irradiation: Secondary | ICD-10-CM | POA: Diagnosis not present

## 2016-10-31 DIAGNOSIS — D090 Carcinoma in situ of bladder: Secondary | ICD-10-CM | POA: Insufficient documentation

## 2016-10-31 DIAGNOSIS — C670 Malignant neoplasm of trigone of bladder: Secondary | ICD-10-CM | POA: Diagnosis present

## 2016-10-31 DIAGNOSIS — J449 Chronic obstructive pulmonary disease, unspecified: Secondary | ICD-10-CM | POA: Diagnosis not present

## 2016-10-31 DIAGNOSIS — I10 Essential (primary) hypertension: Secondary | ICD-10-CM | POA: Diagnosis not present

## 2016-10-31 DIAGNOSIS — Z87891 Personal history of nicotine dependence: Secondary | ICD-10-CM | POA: Insufficient documentation

## 2016-10-31 DIAGNOSIS — E1151 Type 2 diabetes mellitus with diabetic peripheral angiopathy without gangrene: Secondary | ICD-10-CM | POA: Insufficient documentation

## 2016-10-31 HISTORY — PX: TRANSURETHRAL RESECTION OF BLADDER TUMOR: SHX2575

## 2016-10-31 HISTORY — PX: CYSTOSCOPY WITH BIOPSY: SHX5122

## 2016-10-31 LAB — GLUCOSE, CAPILLARY: Glucose-Capillary: 119 mg/dL — ABNORMAL HIGH (ref 65–99)

## 2016-10-31 SURGERY — CYSTOSCOPY, WITH BIOPSY
Anesthesia: General

## 2016-10-31 MED ORDER — FENTANYL CITRATE (PF) 100 MCG/2ML IJ SOLN
INTRAMUSCULAR | Status: AC
  Filled 2016-10-31: qty 2

## 2016-10-31 MED ORDER — CEFAZOLIN SODIUM-DEXTROSE 2-4 GM/100ML-% IV SOLN
2.0000 g | INTRAVENOUS | Status: AC
  Administered 2016-10-31: 2 g via INTRAVENOUS
  Filled 2016-10-31: qty 100

## 2016-10-31 MED ORDER — ACETAMINOPHEN 325 MG PO TABS: 650.0000 mg | ORAL_TABLET | ORAL | Status: DC | PRN

## 2016-10-31 MED ORDER — LIDOCAINE 2% (20 MG/ML) 5 ML SYRINGE
INTRAMUSCULAR | Status: DC | PRN
  Administered 2016-10-31: 100 mg via INTRAVENOUS

## 2016-10-31 MED ORDER — PROPOFOL 10 MG/ML IV BOLUS
INTRAVENOUS | Status: DC | PRN
  Administered 2016-10-31: 200 mg via INTRAVENOUS

## 2016-10-31 MED ORDER — LACTATED RINGERS IV SOLN
INTRAVENOUS | Status: DC
  Administered 2016-10-31: 07:00:00 via INTRAVENOUS

## 2016-10-31 MED ORDER — DEXAMETHASONE SODIUM PHOSPHATE 10 MG/ML IJ SOLN
INTRAMUSCULAR | Status: DC | PRN
  Administered 2016-10-31: 10 mg via INTRAVENOUS

## 2016-10-31 MED ORDER — PROMETHAZINE HCL 25 MG/ML IJ SOLN: 6.2500 mg | INTRAMUSCULAR | Status: DC | PRN

## 2016-10-31 MED ORDER — ONDANSETRON HCL 4 MG/2ML IJ SOLN
INTRAMUSCULAR | Status: DC | PRN
  Administered 2016-10-31: 4 mg via INTRAVENOUS

## 2016-10-31 MED ORDER — LIDOCAINE 2% (20 MG/ML) 5 ML SYRINGE
INTRAMUSCULAR | Status: AC
  Filled 2016-10-31: qty 5

## 2016-10-31 MED ORDER — SODIUM CHLORIDE 0.9% FLUSH: 3.0000 mL | Freq: Two times a day (BID) | INTRAVENOUS | Status: DC

## 2016-10-31 MED ORDER — OXYCODONE HCL 5 MG PO TABS: 5.0000 mg | ORAL_TABLET | ORAL | Status: DC | PRN

## 2016-10-31 MED ORDER — ACETAMINOPHEN 650 MG RE SUPP
650.0000 mg | RECTAL | Status: DC | PRN
  Filled 2016-10-31: qty 1

## 2016-10-31 MED ORDER — STERILE WATER FOR IRRIGATION IR SOLN
Status: DC | PRN
  Administered 2016-10-31: 6000 mL

## 2016-10-31 MED ORDER — AMLODIPINE BESYLATE 10 MG PO TABS
10.0000 mg | ORAL_TABLET | Freq: Once | ORAL | Status: DC
  Filled 2016-10-31: qty 1

## 2016-10-31 MED ORDER — SODIUM CHLORIDE 0.9 % IV SOLN: 250.0000 mL | INTRAVENOUS | Status: DC | PRN

## 2016-10-31 MED ORDER — SODIUM CHLORIDE 0.9 % IV SOLN
50.0000 mg | Freq: Once | INTRAVENOUS | Status: AC
  Administered 2016-10-31: 50 mg via INTRAVESICAL
  Filled 2016-10-31: qty 25

## 2016-10-31 MED ORDER — FENTANYL CITRATE (PF) 100 MCG/2ML IJ SOLN
INTRAMUSCULAR | Status: DC | PRN
  Administered 2016-10-31: 25 ug via INTRAVENOUS
  Administered 2016-10-31: 50 ug via INTRAVENOUS
  Administered 2016-10-31: 25 ug via INTRAVENOUS

## 2016-10-31 MED ORDER — ONDANSETRON HCL 4 MG/2ML IJ SOLN
INTRAMUSCULAR | Status: AC
  Filled 2016-10-31: qty 2

## 2016-10-31 MED ORDER — FENTANYL CITRATE (PF) 100 MCG/2ML IJ SOLN
25.0000 ug | INTRAMUSCULAR | Status: DC | PRN
  Administered 2016-10-31: 50 ug via INTRAVENOUS

## 2016-10-31 MED ORDER — DEXAMETHASONE SODIUM PHOSPHATE 10 MG/ML IJ SOLN
INTRAMUSCULAR | Status: AC
  Filled 2016-10-31: qty 1

## 2016-10-31 MED ORDER — SODIUM CHLORIDE 0.9% FLUSH: 3.0000 mL | INTRAVENOUS | Status: DC | PRN

## 2016-10-31 MED ORDER — PROPOFOL 10 MG/ML IV BOLUS
INTRAVENOUS | Status: AC
  Filled 2016-10-31: qty 20

## 2016-10-31 SURGICAL SUPPLY — 26 items
BAG URINE DRAINAGE (UROLOGICAL SUPPLIES) ×4 IMPLANT
BAG URO CATCHER STRL LF (MISCELLANEOUS) ×4 IMPLANT
CATH FOLEY 2WAY SLVR  5CC 18FR (CATHETERS) ×2
CATH FOLEY 2WAY SLVR 30CC 24FR (CATHETERS) IMPLANT
CATH FOLEY 2WAY SLVR 5CC 18FR (CATHETERS) ×2 IMPLANT
CATH INTERMIT  6FR 70CM (CATHETERS) ×4 IMPLANT
CLOTH BEACON ORANGE TIMEOUT ST (SAFETY) ×4 IMPLANT
ELECT REM PT RETURN 9FT ADLT (ELECTROSURGICAL) ×4
ELECTRODE REM PT RTRN 9FT ADLT (ELECTROSURGICAL) ×2 IMPLANT
EVACUATOR MICROVAS BLADDER (UROLOGICAL SUPPLIES) IMPLANT
GLOVE BIOGEL M 8.0 STRL (GLOVE) ×4 IMPLANT
GOWN STRL REUS W/ TWL XL LVL3 (GOWN DISPOSABLE) ×2 IMPLANT
GOWN STRL REUS W/TWL LRG LVL3 (GOWN DISPOSABLE) ×4 IMPLANT
GOWN STRL REUS W/TWL XL LVL3 (GOWN DISPOSABLE) ×6 IMPLANT
GUIDEWIRE ANG ZIPWIRE 038X150 (WIRE) IMPLANT
GUIDEWIRE STR DUAL SENSOR (WIRE) ×4 IMPLANT
LOOP CUT BIPOLAR 24F LRG (ELECTROSURGICAL) IMPLANT
MANIFOLD NEPTUNE II (INSTRUMENTS) ×4 IMPLANT
NDL SAFETY ECLIPSE 18X1.5 (NEEDLE) IMPLANT
NEEDLE HYPO 18GX1.5 SHARP (NEEDLE)
PACK CYSTO (CUSTOM PROCEDURE TRAY) ×4 IMPLANT
SET ASPIRATION TUBING (TUBING) IMPLANT
SYRINGE IRR TOOMEY STRL 70CC (SYRINGE) IMPLANT
TUBING CONNECTING 10 (TUBING) ×3 IMPLANT
TUBING CONNECTING 10' (TUBING) ×1
WATER STERILE IRR 3000ML UROMA (IV SOLUTION) IMPLANT

## 2016-10-31 NOTE — Discharge Instructions (Signed)
General Anesthesia, Adult, Care After These instructions provide you with information about caring for yourself after your procedure. Your health care provider may also give you more specific instructions. Your treatment has been planned according to current medical practices, but problems sometimes occur. Call your health care provider if you have any problems or questions after your procedure. What can I expect after the procedure? After the procedure, it is common to have:  Vomiting.  A sore throat.  Mental slowness. It is common to feel:  Nauseous.  Cold or shivery.  Sleepy.  Tired.  Sore or achy, even in parts of your body where you did not have surgery. Follow these instructions at home: For at least 24 hours after the procedure:  Do not:  Participate in activities where you could fall or become injured.  Drive.  Use heavy machinery.  Drink alcohol.  Take sleeping pills or medicines that cause drowsiness.  Make important decisions or sign legal documents.  Take care of children on your own.  Rest. Eating and drinking  If you vomit, drink water, juice, or soup when you can drink without vomiting.  Drink enough fluid to keep your urine clear or pale yellow.  Make sure you have little or no nausea before eating solid foods.  Follow the diet recommended by your health care provider. General instructions  Have a responsible adult stay with you until you are awake and alert.  Return to your normal activities as told by your health care provider. Ask your health care provider what activities are safe for you.  Take over-the-counter and prescription medicines only as told by your health care provider.  If you smoke, do not smoke without supervision.  Keep all follow-up visits as told by your health care provider. This is important. Contact a health care provider if:  You continue to have nausea or vomiting at home, and medicines are not helpful.  You  cannot drink fluids or start eating again.  You cannot urinate after 8-12 hours.  You develop a skin rash.  You have fever.  You have increasing redness at the site of your procedure. Get help right away if:  You have difficulty breathing.  You have chest pain.  You have unexpected bleeding.  You feel that you are having a life-threatening or urgent problem. This information is not intended to replace advice given to you by your health care provider. Make sure you discuss any questions you have with your health care provider. Document Released: 12/02/2000 Document Revised: 01/29/2016 Document Reviewed: 08/10/2015 Elsevier Interactive Patient Education  2017 [place], Adult A Foley catheter is a soft, flexible tube. This tube is placed into your bladder to drain pee (urine). If you go home with this catheter in place, follow the instructions below. TAKING CARE OF THE CATHETER Wash your hands with soap and water. Put soap and water on a clean washcloth. Clean the skin where the tube goes into your body. Clean away from the tube site. Never wipe toward the tube. Clean the area using a circular motion. Remove all the soap. Pat the area dry with a clean towel. For males, reposition the skin that covers the end of the penis (foreskin). Attach the tube to your leg with tape or a leg strap. Do not stretch the tube tight. If you are using tape, remove any stickiness left behind by past tape you used. Keep the drainage bag below your hips. Keep it  off the floor. Check your tube during the day. Make sure it is working and draining. Make sure the tube does not curl, twist, or bend. Do not pull on the tube or try to take it out. TAKING CARE OF THE DRAINAGE BAGS You will have a large overnight drainage bag and a small leg bag. You may wear the overnight bag any time. Never wear the small bag at night. Follow the directions below. Emptying the Drainage Bag   Empty your drainage bag when it is ?- full or at least 2-3 times a day. Wash your hands with soap and water. Keep the drainage bag below your hips. Hold the dirty bag over the toilet or clean container. Open the pour spout at the bottom of the bag. Empty the pee into the toilet or container. Do not let the pour spout touch anything. Clean the pour spout with a gauze pad or cotton ball that has rubbing alcohol on it. Close the pour spout. Attach the bag to your leg with tape or a leg strap. Wash your hands well.   PREVENT INFECTION Wash your hands before and after touching your tube. Take showers every day. Wash the skin where the tube enters your body. Do not take baths. Replace wet leg straps with dry ones, if this applies. Do not use powders, sprays, or lotions on the genital area. Only use creams, lotions, or ointments as told by your doctor. For females, wipe from front to back after going to the bathroom. Drink enough fluids to keep your pee clear or pale yellow unless you are told not to have too much fluid (fluid restriction). Do not let the drainage bag or tubing touch or lie on the floor. Wear cotton underwear to keep the area dry. GET HELP IF: Your pee is cloudy or smells unusually bad. Your tube becomes clogged. You are not draining pee into the bag or your bladder feels full. Your tube starts to leak. GET HELP RIGHT AWAY IF: You have pain, puffiness (swelling), redness, or yellowish-white fluid (pus) where the tube enters the body. You have pain in the belly (abdomen), legs, lower back, or bladder. You have a fever. You see blood fill the tube, or your pee is pink or red. You feel sick to your stomach (nauseous), throw up (vomit), or have chills. Your tube gets pulled out. MAKE SURE YOU:  Understand these instructions. Will watch your condition. Will get help right away if you are not doing well or get worse. This information is not intended to replace advice given  to you by your health care provider. Make sure you discuss any questions you have with your health care provider. Document Released: 12/21/2012 Document Revised: 09/16/2014 Document Reviewed: 08/12/2015 Elsevier Interactive Patient Education  2017 [employer] Inc[employer]  Transurethral Resection of Bladder Tumor (TURBT)  Definition:  Transurethral Resection of the Bladder Tumor is a surgical procedure used to diagnose and remove tumors within the bladder. TURBT is the most common treatment for early stage bladder cancer.   General instructions:  Your recent bladder surgery requires very little post hospital care but some definite precautions.  Despite the fact that no skin incisions were used, the area around the tumor removal site is raw and covered with scabs to promote healing and prevent bleeding. Certain precautions are needed to insure that the scabs are not disturbed over the next 2-4 weeks while the healing proceeds.  Because the raw surface inside your bladder and the irritating effects of urine  you may expect frequency of urination and/or urgency (a stronger desire to urinate) and perhaps even getting up at night more often. This will usually resolve or improve slowly over the healing period. You may see some blood in your urine over the first 6 weeks. Do not be alarmed, even if the urine was clear for a while. Get off your feet and drink lots of fluids until clearing occurs. If you start to pass clots or don't improve call us.   It is okay to remove the catheter on Friday morning as directed.  If you have problems, call us.  Diet:  You may return to your normal diet immediately. Because of the raw surface of your bladder, alcohol, spicy foods, foods high in acid and drinks with caffeine may cause irritation or frequency and should be used in moderation. To keep your urine flowing freely and avoid constipation, drink plenty of fluids during the day (8-10 glasses). Tip: Avoid cranberry juice because  it is very acidic.   Activity:  Your physical activity needs to be restricted somewhat.  We suggest that you reduce your activity under the circumstances until the bleeding has stopped. Heavy lifting (greater than 20 lbs.) and heavy exertion should be limited for 2-3 weeks.  Bowels:  It is important to keep your bowels regular during the postoperative period. Straining with bowel movements can cause bleeding. A bowel movement every other day is reasonable. Use a mild laxative if needed, such as milk of magnesia 2-3 tablespoons, or 2 Dulcolax tablets. Call if you continue to have problems. If you had been taking narcotics for pain, before, during or after your surgery, you may be constipated.   Medication:  You should resume your pre-surgery medications unless told not to. In addition you may be given an antibiotic to prevent or treat infection. Antibiotics are not always necessary. All medication should be taken as prescribed until the bottles are finished unless you are having an unusual reaction to one of the drugs.  General Anesthetic, Adult  A doctor specialized in giving anesthesia (anesthesiologist) or a nurse specialized in giving anesthesia (nurse anesthetist) gives medicine that makes you sleep while a procedure is performed (general anesthetic). Once the general anesthetic has been administered, you will be in a sleeplike state in which you feel no pain. After having a general anesthetic you may feel:  Dizzy.  Weak.  Drowsy.  Confused.  These feelings are normal and can be expected to last for up to 24 hours after the procedure.   LET YOUR CAREGIVER KNOW ABOUT:  Allergies you have.  Medications you are taking, including herbs, eye drops, over the counter medications, dietary supplements, and creams.  Previous problems you have had with anesthetics or numbing medicines.  Use of cigarettes, alcohol, or illicit drugs.  Possibility of pregnancy, if this applies.  History of bleeding or  blood disorders, including blood clots and clotting disorders.  Previous surgeries you have had and types of anesthetics you have received.  Family medical history, especially anesthetic problems.  Other health problems.   AFTER THE PROCEDURE  After surgery, you will be taken to the recovery area where a nurse will monitor your progress. You will be allowed to go home when you are awake, stable, taking fluids well, and without serious pain or complications.  For the first 24 hours following an anesthetic:  Have a responsible person with you.  Do not drive a car. If you are alone, do not take public transportation.  Do not engage in strenuous activity. You may usually resume normal activities the next day, or as advised by your caregiver.  Do not drink alcohol.  Do not take medicine that has not been prescribed by your caregiver.  Do not sign important papers or make important decisions as your judgement may be impaired.  You may resume a normal diet as directed.  Change bandages (dressings) as directed.  Only take over-the-counter or prescription medicines for pain, discomfort, or fever as directed by your caregiver.  If you have questions or problems that seem related to the anesthetic, call the hospital and ask for the anesthetist, anesthesiologist, or anesthesia department.   SEEK IMMEDIATE MEDICAL CARE IF:  You develop a rash.  You have difficulty breathing.  You have chest pain.  You have allergic problems.  You have uncontrolled nausea.  You have uncontrolled vomiting.  You develop any serious bleeding, especially from the incision site.  Document Released: 12/03/2007 Document Revised: 05/08/2011 Document Reviewed: 12/27/2010  [facility] Patient Information 2012 [place].

## 2016-10-31 NOTE — Op Note (Signed)
Pre-Op diagnosis: Bladder cancer  Postoperative diagnosis: Same  Principal procedure cystoscopy, TURBT of 1 centimeter bladder tumor, placement of epirubicin intravesical  Surgeon: Shady Padron  Anesthesia: Gen. with LMA  Complications: None  Specimen: Bladder tumor, to pathology  Drains: 73 French Foley catheter  Indications: 83 year old male status post radiotherapy for prostate cancer.  He has had recent cystoscopy revealing a 1 centimeter bladder tumor in the trigonal region.  He presents at this time for TURBT.  Previously, he has been seen by Dr. Rana Snare, who has referred him to me for further management.  I've discussed procedure, risks and complications as well as the need for intravesical epirubicin postoperatively, to limit the risk of recurrence.  He accepts the procedure, risks and complications and desires to proceed.  Findings: Urethra was normal.  Firm bladder neck contracture which did not admit a 26 French resectoscope sheath, but easily admitted a 56 French cystoscopy sheath.  Bladder was normal except for a papillary tumor approximately 1 centimeter medial and superior to the left ureteral orifice.  No other bladder lesions were noted.  Ureteral orifices were normal in configuration and location.  Description of procedure: The patient was properly identified in the holding area.  He was taken to the operating room where general anesthesia was administered with the LMA.  He was placed in the dorsolithotomy position.  An attack and perineum were prepped and draped.  Proper timeout was performed.  I first tried to admit the 26 French resectoscope sheath with visual obturator.  This was unsuccessful due to the bladder neck contracture.  This was then removed, and I passed the 21 French cystoscope easily.  The bladder was then inspected with the only abnormality seen.  Being a 1 centimeter papillary bladder tumor previously discussed.  Following circumferential inspection with  both the 70 and 12 lenses, the cold cup biopsy forceps were utilized to totally remove the papillary lesion, and the urothelium around this.  The biopsy was quite deep, I felt easily into the muscular layer.  The bladder seemed to be fairly thin.  Following entire bladder tumor resection, I then used the Bugbee electrode to cauterize the resected area.  There was no significant perforation noted.  Following adequate hemostasis, the scope was removed.  I then admitted an 57 French Foley catheter.  The bladder was totally drained.  The balloon was filled with 10 mL of water.  The catheter was plugged.  The patient was then awakened and taken to the PACU, having tolerated procedure well.  In the PACU, 50 milligrams of epirubicin and 50 mL of diluent were instilled within the bladder.  The plug was then placed.  This was left instilled for 1 hour, then drained by the PACU staff.  The patient was then discharged home.  He will follow-up in approximately 6-8 weeks in our office.

## 2016-10-31 NOTE — Progress Notes (Signed)
Standard bag changed to leg bag.

## 2016-10-31 NOTE — Anesthesia Postprocedure Evaluation (Signed)
Anesthesia Post Note  Patient: SHLOIME CREESE  Procedure(s) Performed: Procedure(s) (LRB): CYSTOSCOPY (N/A) TRANSURETHRAL RESECTION OF BLADDER TUMOR (TURBT) (N/A)  Patient location during evaluation: PACU Anesthesia Type: General Level of consciousness: awake and alert Pain management: pain level controlled Vital Signs Assessment: post-procedure vital signs reviewed and stable Respiratory status: spontaneous breathing, nonlabored ventilation, respiratory function stable and patient connected to nasal cannula oxygen Cardiovascular status: blood pressure returned to baseline and stable Postop Assessment: no signs of nausea or vomiting Anesthetic complications: no       Last Vitals:  Vitals:   10/31/16 0634 10/31/16 0935  BP: (!) 166/95 (!) 145/85  Pulse: 88 72  Resp: 16   Temp: 36.7 C     Last Pain:  Vitals:   10/31/16 0634  TempSrc: Oral                 Takya Vandivier S

## 2016-10-31 NOTE — Anesthesia Procedure Notes (Signed)
Procedure Name: LMA Insertion Date/Time: 10/31/2016 9:04 AM Performed by: Lind Covert Pre-anesthesia Checklist: Patient identified, Emergency Drugs available, Suction available, Patient being monitored and Timeout performed Patient Re-evaluated:Patient Re-evaluated prior to inductionOxygen Delivery Method: Circle system utilized Preoxygenation: Pre-oxygenation with 100% oxygen Intubation Type: IV induction LMA: LMA with gastric port inserted LMA Size: 4.0 Number of attempts: 1 Placement Confirmation: positive ETCO2 and breath sounds checked- equal and bilateral Tube secured with: Tape Dental Injury: Teeth and Oropharynx as per pre-operative assessment

## 2016-10-31 NOTE — Anesthesia Preprocedure Evaluation (Addendum)
Anesthesia Evaluation  Patient identified by MRN, date of birth, ID band Patient awake    Reviewed: Allergy & Precautions, NPO status , Patient's Chart, lab work & pertinent test results  Airway Mallampati: II  TM Distance: >3 FB Neck ROM: Full    Dental no notable dental hx.    Pulmonary COPD, former smoker,    Pulmonary exam normal breath sounds clear to auscultation       Cardiovascular hypertension, + Peripheral Vascular Disease  Normal cardiovascular exam Rhythm:Regular Rate:Normal  Left ventricle: The cavity size was normal. There was mild   concentric hypertrophy. Systolic function was normal. The   estimated ejection fraction was in the range of 50% to 55%. Wall   motion was normal; there were no regional wall motion   abnormalities. Doppler parameters are consistent with abnormal   left ventricular relaxation (grade 1 diastolic dysfunction).   There was no evidence of elevated ventricular filling pressure by   Doppler parameters. - Aortic valve: Trileaflet; mildly thickened, mildly calcified   leaflets. Sclerosis without stenosis. There was no regurgitation. - Aortic root: The aortic root was normal in size. - Ascending aorta: The ascending aorta was mildly dilated measuring   41 mm. - Mitral valve: Mildly thickened leaflets . There was trivial   regurgitation. - Left atrium: The atrium was moderately dilated. - Right ventricle: The cavity size was normal. Wall thickness was   normal. Systolic function was normal. - Right atrium: The atrium was normal in size. - Tricuspid valve: There was mild regurgitation. - Pulmonic valve: Structurally normal valve. There was no   regurgitation. - Pulmonary arteries: Systolic pressure was mildly increased. PA   peak pressure: 40 mm Hg (S). - Inferior vena cava: The vessel was normal in size. - Pericardium, extracardiac: There was no pericardial effusion.  Impressions:  -  When compared to the prior study from 01/21/2013 LVEF appears   slightly worse estimated at 50-55% with mild diffuse hypokinesis.    Neuro/Psych negative neurological ROS  negative psych ROS   GI/Hepatic negative GI ROS, Neg liver ROS,   Endo/Other  diabetes  Renal/GU negative Renal ROS  negative genitourinary   Musculoskeletal negative musculoskeletal ROS (+)   Abdominal   Peds negative pediatric ROS (+)  Hematology negative hematology ROS (+)   Anesthesia Other Findings   Reproductive/Obstetrics negative OB ROS                            Anesthesia Physical Anesthesia Plan  ASA: III  Anesthesia Plan: General   Post-op Pain Management:    Induction: Intravenous  Airway Management Planned: LMA  Additional Equipment:   Intra-op Plan:   Post-operative Plan: Extubation in OR  Informed Consent: I have reviewed the patients History and Physical, chart, labs and discussed the procedure including the risks, benefits and alternatives for the proposed anesthesia with the patient or authorized representative who has indicated his/her understanding and acceptance.   Dental advisory given  Plan Discussed with: CRNA and Surgeon  Anesthesia Plan Comments:         Anesthesia Quick Evaluation

## 2016-10-31 NOTE — Transfer of Care (Signed)
Immediate Anesthesia Transfer of Care Note  Patient: Geoffrey Frank  Procedure(s) Performed: Procedure(s): CYSTOSCOPY (N/A) TRANSURETHRAL RESECTION OF BLADDER TUMOR (TURBT) (N/A)  Patient Location: PACU  Anesthesia Type:General  Level of Consciousness: sedated  Airway & Oxygen Therapy: Patient Spontanous Breathing and Patient connected to face mask oxygen  Post-op Assessment: Report given to RN and Post -op Vital signs reviewed and stable  Post vital signs: Reviewed and stable  Last Vitals:  Vitals:   10/31/16 0634  BP: (!) 166/95  Pulse: 88  Resp: 16  Temp: 36.7 C    Last Pain:  Vitals:   10/31/16 0634  TempSrc: Oral      Patients Stated Pain Goal: 3 ([id number])  Complications: No apparent anesthesia complications

## 2016-11-01 ENCOUNTER — Encounter (HOSPITAL_COMMUNITY): Payer: Self-pay | Admitting: Urology

## 2017-02-10 NOTE — Addendum Note (Signed)
Addendum  created 02/10/17 1127 by Myrtie Soman, MD   Sign clinical note

## 2017-02-10 NOTE — Anesthesia Postprocedure Evaluation (Signed)
Anesthesia Post Note  Patient: Geoffrey Frank  Procedure(s) Performed: Procedure(s) (LRB): CYSTOSCOPY (N/A) TRANSURETHRAL RESECTION OF BLADDER TUMOR (TURBT) (N/A)     Anesthesia Post Evaluation  Last Vitals:  Vitals:   10/31/16 1137 10/31/16 1223  BP: (!) 132/94 (!) 154/76  Pulse: 88 89  Resp: 16 16  Temp: 37.1 C 36.7 C    Last Pain:  Vitals:   11/01/16 1347  TempSrc:   PainSc: 1                  Aryn Kops S

## 2017-02-28 ENCOUNTER — Ambulatory Visit: Payer: Medicare Other | Admitting: Internal Medicine

## 2017-03-13 ENCOUNTER — Encounter: Payer: Self-pay | Admitting: Internal Medicine

## 2017-03-13 ENCOUNTER — Ambulatory Visit (INDEPENDENT_AMBULATORY_CARE_PROVIDER_SITE_OTHER): Payer: Medicare Other | Admitting: Internal Medicine

## 2017-03-13 VITALS — BP 140/78 | HR 70 | Ht 72.0 in | Wt 184.2 lb

## 2017-03-13 DIAGNOSIS — Z79899 Other long term (current) drug therapy: Secondary | ICD-10-CM | POA: Diagnosis not present

## 2017-03-13 DIAGNOSIS — I10 Essential (primary) hypertension: Secondary | ICD-10-CM | POA: Diagnosis not present

## 2017-03-13 DIAGNOSIS — R600 Localized edema: Secondary | ICD-10-CM

## 2017-03-13 DIAGNOSIS — I471 Supraventricular tachycardia: Secondary | ICD-10-CM | POA: Diagnosis not present

## 2017-03-13 MED ORDER — FUROSEMIDE 20 MG PO TABS: 20.0000 mg | ORAL_TABLET | Freq: Every day | ORAL | 3 refills | Status: DC

## 2017-03-13 NOTE — Patient Instructions (Signed)
Your physician has recommended you make the following change in your medication:  -- START lasix 20mg  once daily  Your physician recommends that you return for lab work in: [facility] in [place] recommends that you schedule a follow-up appointment in 6 months with Dr. Debara Pickett.
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
## 2017-03-23 ENCOUNTER — Other Ambulatory Visit: Payer: Self-pay | Admitting: Internal Medicine

## 2017-05-20 ENCOUNTER — Emergency Department (HOSPITAL_COMMUNITY): Payer: Medicare Other

## 2017-05-20 ENCOUNTER — Emergency Department (HOSPITAL_COMMUNITY)
Admission: EM | Admit: 2017-05-20 | Discharge: 2017-05-20 | Disposition: A | Discharge status: Home / Self Care | Payer: Medicare Other | Attending: Emergency Medicine | Admitting: Emergency Medicine

## 2017-05-20 ENCOUNTER — Encounter (HOSPITAL_COMMUNITY): Payer: Self-pay | Admitting: Emergency Medicine

## 2017-05-20 DIAGNOSIS — Z7982 Long term (current) use of aspirin: Secondary | ICD-10-CM | POA: Insufficient documentation

## 2017-05-20 DIAGNOSIS — E119 Type 2 diabetes mellitus without complications: Secondary | ICD-10-CM | POA: Insufficient documentation

## 2017-05-20 DIAGNOSIS — Z8551 Personal history of malignant neoplasm of bladder: Secondary | ICD-10-CM | POA: Insufficient documentation

## 2017-05-20 DIAGNOSIS — Z87891 Personal history of nicotine dependence: Secondary | ICD-10-CM | POA: Insufficient documentation

## 2017-05-20 DIAGNOSIS — Z79899 Other long term (current) drug therapy: Secondary | ICD-10-CM | POA: Diagnosis not present

## 2017-05-20 DIAGNOSIS — Z8546 Personal history of malignant neoplasm of prostate: Secondary | ICD-10-CM | POA: Insufficient documentation

## 2017-05-20 DIAGNOSIS — K5900 Constipation, unspecified: Secondary | ICD-10-CM | POA: Diagnosis present

## 2017-05-20 DIAGNOSIS — J449 Chronic obstructive pulmonary disease, unspecified: Secondary | ICD-10-CM | POA: Insufficient documentation

## 2017-05-20 DIAGNOSIS — I10 Essential (primary) hypertension: Secondary | ICD-10-CM | POA: Diagnosis not present

## 2017-05-20 MED ORDER — MAGNESIUM CITRATE PO SOLN: ORAL | 1 refills | Status: DC

## 2017-05-20 NOTE — ED Triage Notes (Signed)
Patient c/o constipation, states last BM on Friday. Denies abdominal pain, N/V/D. Hx of same. Reports using enema and suppository at home with no relief.
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
## 2017-05-20 NOTE — Discharge Instructions (Signed)
Follow up with your md next week.  Drink plenty of fluids °

## 2017-06-04 DIAGNOSIS — Z87891 Personal history of nicotine dependence: Secondary | ICD-10-CM | POA: Diagnosis not present

## 2017-06-04 DIAGNOSIS — I10 Essential (primary) hypertension: Secondary | ICD-10-CM | POA: Insufficient documentation

## 2017-06-04 DIAGNOSIS — K5641 Fecal impaction: Secondary | ICD-10-CM | POA: Diagnosis not present

## 2017-06-04 DIAGNOSIS — Z79899 Other long term (current) drug therapy: Secondary | ICD-10-CM | POA: Diagnosis not present

## 2017-06-04 DIAGNOSIS — J441 Chronic obstructive pulmonary disease with (acute) exacerbation: Secondary | ICD-10-CM | POA: Diagnosis not present

## 2017-06-04 DIAGNOSIS — K5900 Constipation, unspecified: Secondary | ICD-10-CM | POA: Diagnosis present

## 2017-06-04 DIAGNOSIS — Z96643 Presence of artificial hip joint, bilateral: Secondary | ICD-10-CM | POA: Insufficient documentation

## 2017-06-05 ENCOUNTER — Emergency Department (HOSPITAL_COMMUNITY)
Admission: EM | Admit: 2017-06-05 | Discharge: 2017-06-05 | Disposition: A | Discharge status: Home / Self Care | Payer: Medicare Other | Attending: Emergency Medicine | Admitting: Emergency Medicine

## 2017-06-05 ENCOUNTER — Encounter (HOSPITAL_COMMUNITY): Payer: Self-pay | Admitting: Emergency Medicine

## 2017-06-05 DIAGNOSIS — K5901 Slow transit constipation: Secondary | ICD-10-CM

## 2017-06-05 DIAGNOSIS — K5641 Fecal impaction: Secondary | ICD-10-CM

## 2017-06-05 NOTE — Discharge Instructions (Signed)
Increase your Miralax to twice a day. If that is not effective, you can increase it to three times a day.
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
## 2017-06-05 NOTE — ED Triage Notes (Signed)
Pt states he has constipation  Pt states he had a bowel movement yesterday  Pt states today he has pressure in his rectal area  Pt states he was seen here about 2 to 3 weeks ago  Pt states he drank a bottle of mag citrate today about 4pm without results

## 2017-08-12 ENCOUNTER — Other Ambulatory Visit: Payer: Self-pay | Admitting: Urology

## 2017-09-26 ENCOUNTER — Other Ambulatory Visit (HOSPITAL_COMMUNITY): Payer: Self-pay | Admitting: Emergency Medicine

## 2017-09-26 NOTE — Progress Notes (Signed)
lov cardiology Dr Lyman Bishop 03-13-17 epic   ekg 03-13-17 epic   ECHO 04-24-16 epic   cxr 05-20-17 epic

## 2017-09-26 NOTE — Patient Instructions (Addendum)
Geoffrey Frank  09/26/2017   Your procedure is scheduled on:10-06-17     Report to [facility] Main  Entrance    Report to admitting at AM     Call this number if you have problems the morning of surgery [phone]     Remember: Do not eat food or drink liquids :After Midnight.     Take these medicines the morning of surgery with A SIP OF WATER: metoprolol, tylenol if needed, inhaler if needed (may bring )                                You may not have any metal on your body including hair pins and              piercings  Do not wear jewelry, make-up, lotions, powders or perfumes, deodorant                        Men may shave face and neck.   Do not bring valuables to the hospital. [place].  Contacts, dentures or bridgework may not be worn into surgery.  Leave suitcase in the car. After surgery it may be brought to your room.               Please read over the following fact sheets you were given: _____________________________________________________________________             How to Manage Your Diabetes Before and After Surgery  Why is it important to control my blood sugar before and after surgery? . Improving blood sugar levels before and after surgery helps healing and can limit problems. . A way of improving blood sugar control is eating a healthy diet by: o  Eating less sugar and carbohydrates o  Increasing activity/exercise o  Talking with your doctor about reaching your blood sugar goals . High blood sugars (greater than 180 mg/dL) can raise your risk of infections and slow your recovery, so you will need to focus on controlling your diabetes during the weeks before surgery. . Make sure that the doctor who takes care of your diabetes knows about your planned surgery including the date and location.   Patient Signature:  Date:   Nurse Signature:  Date:   Reviewed and Endorsed by  [facility] Patient Education Committee, August 2015  [facility] - Preparing for Surgery Before surgery, you can play an important role.  Because skin is not sterile, your skin needs to be as free of germs as possible.  You can reduce the number of germs on your skin by washing with CHG (chlorahexidine gluconate) soap before surgery.  CHG is an antiseptic cleaner which kills germs and bonds with the skin to continue killing germs even after washing. Please DO NOT use if you have an allergy to CHG or antibacterial soaps.  If your skin becomes reddened/irritated stop using the CHG and inform your nurse when you arrive at Short Stay. Do not shave (including legs and underarms) for at least 48 hours prior to the first CHG shower.  You may shave your face/neck. Please follow these instructions carefully:  1.  Shower with CHG Soap the night before surgery and the  morning  of Surgery.  2.  If you choose to wash your hair, wash your hair first as usual with your  normal  shampoo.  3.  After you shampoo, rinse your hair and body thoroughly to remove the  shampoo.                           4.  Use CHG as you would any other liquid soap.  You can apply chg directly  to the skin and wash                       Gently with a scrungie or clean washcloth.  5.  Apply the CHG Soap to your body ONLY FROM THE NECK DOWN.   Do not use on face/ open                           Wound or open sores. Avoid contact with eyes, ears mouth and genitals (private parts).                       Wash face,  Genitals (private parts) with your normal soap.             6.  Wash thoroughly, paying special attention to the area where your surgery  will be performed.  7.  Thoroughly rinse your body with warm water from the neck down.  8.  DO NOT shower/wash with your normal soap after using and rinsing off  the CHG Soap.                9.  Pat yourself dry with a clean towel.            10.  Wear clean pajamas.            11.  Place clean  sheets on your bed the night of your first shower and do not  sleep with pets. Day of Surgery : Do not apply any lotions/deodorants the morning of surgery.  Please wear clean clothes to the hospital/surgery center.  FAILURE TO FOLLOW THESE INSTRUCTIONS MAY RESULT IN THE CANCELLATION OF YOUR SURGERY PATIENT SIGNATURE_________________________________  NURSE SIGNATURE__________________________________  ________________________________________________________________________

## 2017-09-29 ENCOUNTER — Other Ambulatory Visit: Payer: Self-pay

## 2017-09-29 ENCOUNTER — Telehealth: Payer: Self-pay | Admitting: Internal Medicine

## 2017-09-29 ENCOUNTER — Encounter (HOSPITAL_COMMUNITY)
Admission: RE | Admit: 2017-09-29 | Discharge: 2017-09-29 | Disposition: A | Discharge status: Home / Self Care | Payer: Medicare Other | Source: Ambulatory Visit | Attending: Urology | Admitting: Urology

## 2017-09-29 ENCOUNTER — Encounter (INDEPENDENT_AMBULATORY_CARE_PROVIDER_SITE_OTHER): Payer: Self-pay

## 2017-09-29 ENCOUNTER — Encounter (HOSPITAL_COMMUNITY): Payer: Self-pay

## 2017-09-29 DIAGNOSIS — Z01812 Encounter for preprocedural laboratory examination: Secondary | ICD-10-CM | POA: Diagnosis not present

## 2017-09-29 DIAGNOSIS — I471 Supraventricular tachycardia: Secondary | ICD-10-CM | POA: Insufficient documentation

## 2017-09-29 DIAGNOSIS — R040 Epistaxis: Secondary | ICD-10-CM | POA: Insufficient documentation

## 2017-09-29 DIAGNOSIS — Z8546 Personal history of malignant neoplasm of prostate: Secondary | ICD-10-CM | POA: Diagnosis not present

## 2017-09-29 DIAGNOSIS — I10 Essential (primary) hypertension: Secondary | ICD-10-CM | POA: Insufficient documentation

## 2017-09-29 DIAGNOSIS — D494 Neoplasm of unspecified behavior of bladder: Secondary | ICD-10-CM | POA: Diagnosis not present

## 2017-09-29 DIAGNOSIS — R600 Localized edema: Secondary | ICD-10-CM | POA: Insufficient documentation

## 2017-09-29 LAB — BASIC METABOLIC PANEL
Anion gap: 5 (ref 5–15)
BUN: 17 mg/dL (ref 6–20)
CO2: 26 mmol/L (ref 22–32)
Calcium: 9 mg/dL (ref 8.9–10.3)
Chloride: 107 mmol/L (ref 101–111)
Creatinine, Ser: 0.83 mg/dL (ref 0.61–1.24)
GFR calc Af Amer: >60 mL/min (ref >60)
GFR calc non Af Amer: >60 mL/min (ref >60)
Glucose, Bld: 102 mg/dL — ABNORMAL HIGH (ref 65–99)
Potassium: 4.5 mmol/L (ref 3.5–5.1)
Sodium: 138 mmol/L (ref 135–145)

## 2017-09-29 LAB — CBC
HCT: 37.5 % — ABNORMAL LOW (ref 39.0–52.0)
Hemoglobin: 11.4 g/dL — ABNORMAL LOW (ref 13.0–17.0)
MCH: 22.8 pg — ABNORMAL LOW (ref 26.0–34.0)
MCHC: 30.4 g/dL (ref 30.0–36.0)
MCV: 75 fL — ABNORMAL LOW (ref 78.0–100.0)
Platelets: 187 10*3/uL (ref 150–400)
RBC: 5 MIL/uL (ref 4.22–5.81)
RDW: 15 % (ref 11.5–15.5)
WBC: 5.6 10*3/uL (ref 4.0–10.5)

## 2017-09-29 LAB — HEMOGLOBIN A1C
Hgb A1c MFr Bld: 5.9 % — ABNORMAL HIGH (ref 4.8–5.6)
Mean Plasma Glucose: 122.63 mg/dL

## 2017-09-29 NOTE — Telephone Encounter (Signed)
Patient of Dr. Debara Pickett is supposed to have bladder surgery by Dr. Diona Fanti per presurgical nurse at [facility]. She asked patient if clearance for this surgery was sought out from Dr. Debara Pickett and he was not aware of such. We have not received a clearance request for this per chart review. He is scheduled for surgery on Jan 28. Per pre-surgical nurse, he should hold ASA 81mg  for 5 days. Will route to pre-op pool to review and advise. Will need clearance info sent to Dr. Diona Fanti and [facility] pre-surgical.  [facility] pre-surgical Fax: [phone] Phone: [phone]

## 2017-09-29 NOTE — Telephone Encounter (Signed)
   Primary Cardiologist: Pixie Casino, MD  Chart reviewed as part of pre-operative protocol coverage. Patient was contacted 09/29/2017 in reference to pre-operative risk assessment for pending surgery as outlined below.  Geoffrey Frank was last seen on 03/13/17 by Dr. Debara Pickett. History of PVCs, SVT, neuropathy, borderline DM, HTN, PVD (details unclear, listed in PMH but not mentioned in cardiology note), prostate CA, bladder CA, arthritis, COPD. Nuc 2015 was low risk with fixed inferoapical defect, EF 49%. 2D echo 04/2016 showed mild reduction in EF with diffuse mild hypokinesis. Last OV demonstrated edema for which Lasix was started, not felt to be related to decreased EF. BNP ordered but patient did not have drawn. F/u in 09/2017 was recommended but scheduled for 11/2017. Assuming the edema did not represent clinical CHF, RCRI is calculated at 0.4%. Even if this did represent CHF the RCRI is still <1% (0.9%). He is able to complete 5.07 METS without any angina. He says everything is "status quo" and he's not had any chest pain or dyspnea. Walks his chihuahua 4x a day at his apartment complex and does his own housework.   Therefore, based on ACC/AHA guidelines, the patient would be at acceptable risk for the planned procedure without further cardiovascular testing. Will route to Dr. Debara Pickett for confirmation/final input given advanced age, also for the final OK to hold aspirin as requested. Dr. Debara Pickett, please route your reply to P CV DIV PREOP. Thank you.  Charlie Pitter, PA-C 09/29/2017, 4:15 PM

## 2017-09-29 NOTE — Progress Notes (Signed)
At pre-op appt, patient unsure if his cardiologist is aware of his upcoming surgery next Monday . RN called to patient cardiologist Dr Debara Pickett office and spoke with Eliezer Lofts, Dr [facility] nurse. Per Eliezer Lofts , they have not received any cardiac clearance requests from [facility] yet but she will send a message to the Pre-op clearance dept in their office to f/u with [facility]. Eliezer Lofts also stats that since Dr Debara Pickett has not seen patient in 6 months, patient may be required to come in for office visit. RN provided contact number and fax number to [facility] so clearance may be faxed to PST if obtained. RN will continue to F/U.

## 2017-09-30 NOTE — Telephone Encounter (Signed)
Ok to proceed at acceptable risk for surgery and hold aspirin 7-10 days prior if necessary.  Dr. Lemmie Evens

## 2017-10-06 ENCOUNTER — Ambulatory Visit (HOSPITAL_COMMUNITY): Payer: Medicare Other | Admitting: Anesthesiology

## 2017-10-06 ENCOUNTER — Encounter (HOSPITAL_COMMUNITY): Payer: Self-pay | Admitting: *Deleted

## 2017-10-06 ENCOUNTER — Ambulatory Visit (HOSPITAL_COMMUNITY)
Admission: RE | Admit: 2017-10-06 | Discharge: 2017-10-06 | Disposition: A | Discharge status: Home / Self Care | Payer: Medicare Other | Source: Ambulatory Visit | Attending: Urology | Admitting: Urology

## 2017-10-06 ENCOUNTER — Encounter (HOSPITAL_COMMUNITY)
Admission: RE | Disposition: A | Discharge status: Home / Self Care | Payer: Self-pay | Source: Ambulatory Visit | Attending: Urology

## 2017-10-06 DIAGNOSIS — F329 Major depressive disorder, single episode, unspecified: Secondary | ICD-10-CM | POA: Diagnosis not present

## 2017-10-06 DIAGNOSIS — Z79899 Other long term (current) drug therapy: Secondary | ICD-10-CM | POA: Insufficient documentation

## 2017-10-06 DIAGNOSIS — Z8551 Personal history of malignant neoplasm of bladder: Secondary | ICD-10-CM | POA: Diagnosis not present

## 2017-10-06 DIAGNOSIS — Z7982 Long term (current) use of aspirin: Secondary | ICD-10-CM | POA: Diagnosis not present

## 2017-10-06 DIAGNOSIS — Z8546 Personal history of malignant neoplasm of prostate: Secondary | ICD-10-CM | POA: Diagnosis not present

## 2017-10-06 DIAGNOSIS — Z87442 Personal history of urinary calculi: Secondary | ICD-10-CM | POA: Insufficient documentation

## 2017-10-06 DIAGNOSIS — J449 Chronic obstructive pulmonary disease, unspecified: Secondary | ICD-10-CM | POA: Insufficient documentation

## 2017-10-06 DIAGNOSIS — E1151 Type 2 diabetes mellitus with diabetic peripheral angiopathy without gangrene: Secondary | ICD-10-CM | POA: Diagnosis not present

## 2017-10-06 DIAGNOSIS — N320 Bladder-neck obstruction: Secondary | ICD-10-CM | POA: Diagnosis not present

## 2017-10-06 DIAGNOSIS — I10 Essential (primary) hypertension: Secondary | ICD-10-CM | POA: Insufficient documentation

## 2017-10-06 DIAGNOSIS — Z87891 Personal history of nicotine dependence: Secondary | ICD-10-CM | POA: Insufficient documentation

## 2017-10-06 DIAGNOSIS — Z86010 Personal history of colonic polyps: Secondary | ICD-10-CM | POA: Diagnosis not present

## 2017-10-06 HISTORY — PX: CYSTOSCOPY WITH FULGERATION: SHX6638

## 2017-10-06 LAB — GLUCOSE, CAPILLARY: Glucose-Capillary: 77 mg/dL (ref 65–99)

## 2017-10-06 SURGERY — CYSTOSCOPY, WITH BLADDER FULGURATION
Anesthesia: General

## 2017-10-06 MED ORDER — FENTANYL CITRATE (PF) 100 MCG/2ML IJ SOLN
INTRAMUSCULAR | Status: AC
  Filled 2017-10-06: qty 2

## 2017-10-06 MED ORDER — CEFAZOLIN SODIUM-DEXTROSE 2-4 GM/100ML-% IV SOLN
2.0000 g | INTRAVENOUS | Status: AC
  Administered 2017-10-06: 2 g via INTRAVENOUS
  Filled 2017-10-06: qty 100

## 2017-10-06 MED ORDER — ONDANSETRON HCL 4 MG/2ML IJ SOLN
INTRAMUSCULAR | Status: AC
  Filled 2017-10-06: qty 2

## 2017-10-06 MED ORDER — LIDOCAINE 2% (20 MG/ML) 5 ML SYRINGE
INTRAMUSCULAR | Status: AC
  Filled 2017-10-06: qty 5

## 2017-10-06 MED ORDER — PROPOFOL 10 MG/ML IV BOLUS
INTRAVENOUS | Status: DC | PRN
  Administered 2017-10-06: 200 mg via INTRAVENOUS

## 2017-10-06 MED ORDER — STERILE WATER FOR IRRIGATION IR SOLN
Status: DC | PRN
  Administered 2017-10-06: 1000 mL

## 2017-10-06 MED ORDER — LACTATED RINGERS IV SOLN
INTRAVENOUS | Status: DC
  Administered 2017-10-06: 09:00:00 via INTRAVENOUS

## 2017-10-06 MED ORDER — FENTANYL CITRATE (PF) 100 MCG/2ML IJ SOLN: 25.0000 ug | INTRAMUSCULAR | Status: DC | PRN

## 2017-10-06 MED ORDER — FENTANYL CITRATE (PF) 100 MCG/2ML IJ SOLN
INTRAMUSCULAR | Status: DC | PRN
  Administered 2017-10-06: 25 ug via INTRAVENOUS
  Administered 2017-10-06: 50 ug via INTRAVENOUS
  Administered 2017-10-06: 25 ug via INTRAVENOUS

## 2017-10-06 MED ORDER — DEXAMETHASONE SODIUM PHOSPHATE 10 MG/ML IJ SOLN
INTRAMUSCULAR | Status: AC
  Filled 2017-10-06: qty 1

## 2017-10-06 MED ORDER — PROPOFOL 10 MG/ML IV BOLUS
INTRAVENOUS | Status: AC
  Filled 2017-10-06: qty 20

## 2017-10-06 MED ORDER — AMLODIPINE BESYLATE 10 MG PO TABS
10.0000 mg | ORAL_TABLET | Freq: Once | ORAL | Status: DC
  Filled 2017-10-06: qty 1

## 2017-10-06 MED ORDER — LIDOCAINE 2% (20 MG/ML) 5 ML SYRINGE
INTRAMUSCULAR | Status: DC | PRN
  Administered 2017-10-06: 100 mg via INTRAVENOUS

## 2017-10-06 MED ORDER — DEXAMETHASONE SODIUM PHOSPHATE 10 MG/ML IJ SOLN
INTRAMUSCULAR | Status: DC | PRN
  Administered 2017-10-06: 10 mg via INTRAVENOUS

## 2017-10-06 MED ORDER — STERILE WATER FOR IRRIGATION IR SOLN
Status: DC | PRN
  Administered 2017-10-06: 3000 mL

## 2017-10-06 MED ORDER — ONDANSETRON HCL 4 MG/2ML IJ SOLN
INTRAMUSCULAR | Status: DC | PRN
  Administered 2017-10-06: 4 mg via INTRAVENOUS

## 2017-10-06 SURGICAL SUPPLY — 19 items
BAG URINE DRAINAGE (UROLOGICAL SUPPLIES) IMPLANT
BAG URO CATCHER STRL LF (MISCELLANEOUS) ×3 IMPLANT
CATH FOLEY 2WAY SLVR  5CC 18FR (CATHETERS) ×2
CATH FOLEY 2WAY SLVR 30CC 24FR (CATHETERS) IMPLANT
CATH FOLEY 2WAY SLVR 5CC 18FR (CATHETERS) ×1 IMPLANT
COVER FOOTSWITCH UNIV (MISCELLANEOUS) ×3 IMPLANT
ELECT REM PT RETURN 15FT ADLT (MISCELLANEOUS) ×3 IMPLANT
EVACUATOR MICROVAS BLADDER (UROLOGICAL SUPPLIES) IMPLANT
GLOVE BIOGEL M 8.0 STRL (GLOVE) ×3 IMPLANT
GOWN STRL REUS W/TWL XL LVL3 (GOWN DISPOSABLE) ×3 IMPLANT
LOOP MONOPOLAR YLW (ELECTROSURGICAL) IMPLANT
MANIFOLD NEPTUNE II (INSTRUMENTS) ×3 IMPLANT
NDL SAFETY ECLIPSE 18X1.5 (NEEDLE) ×1 IMPLANT
NEEDLE HYPO 18GX1.5 SHARP (NEEDLE) ×2
PACK CYSTO (CUSTOM PROCEDURE TRAY) ×3 IMPLANT
SET ASPIRATION TUBING (TUBING) IMPLANT
SYRINGE IRR TOOMEY STRL 70CC (SYRINGE) IMPLANT
TUBING CONNECTING 10 (TUBING) ×2 IMPLANT
TUBING CONNECTING 10' (TUBING) ×1

## 2017-10-06 NOTE — Anesthesia Procedure Notes (Signed)
Procedure Name: LMA Insertion Date/Time: 10/06/2017 9:45 AM Performed by: Lind Covert, CRNA Pre-anesthesia Checklist: Patient identified, Emergency Drugs available, Suction available, Patient being monitored and Timeout performed Patient Re-evaluated:Patient Re-evaluated prior to induction Oxygen Delivery Method: Circle system utilized Preoxygenation: Pre-oxygenation with 100% oxygen Induction Type: IV induction LMA: LMA inserted LMA Size: 4.0 Number of attempts: 1 Placement Confirmation: positive ETCO2 and breath sounds checked- equal and bilateral Tube secured with: Tape Dental Injury: Teeth and Oropharynx as per pre-operative assessment

## 2017-10-06 NOTE — Anesthesia Preprocedure Evaluation (Addendum)
Anesthesia Evaluation  Patient identified by MRN, date of birth, ID band Patient awake    Reviewed: Allergy & Precautions, NPO status , Patient's Chart, lab work & pertinent test results  History of Anesthesia Complications Negative for: history of anesthetic complications  Airway Mallampati: II  TM Distance: >3 FB Neck ROM: Full    Dental  (+) Edentulous Upper, Edentulous Lower, Dental Advisory Given   Pulmonary COPD, former smoker,    Pulmonary exam normal breath sounds clear to auscultation       Cardiovascular hypertension, Pt. on medications and Pt. on home beta blockers + Peripheral Vascular Disease  Normal cardiovascular exam Rhythm:Regular Rate:Normal  Left ventricle: The cavity size was normal. There was mild   concentric hypertrophy. Systolic function was normal. The   estimated ejection fraction was in the range of 50% to 55%. Wall   motion was normal; there were no regional wall motion   abnormalities. Doppler parameters are consistent with abnormal   left ventricular relaxation (grade 1 diastolic dysfunction).   There was no evidence of elevated ventricular filling pressure by   Doppler parameters. - Aortic valve: Trileaflet; mildly thickened, mildly calcified   leaflets. Sclerosis without stenosis. There was no regurgitation. - Aortic root: The aortic root was normal in size. - Ascending aorta: The ascending aorta was mildly dilated measuring   41 mm. - Mitral valve: Mildly thickened leaflets . There was trivial   regurgitation. - Left atrium: The atrium was moderately dilated. - Right ventricle: The cavity size was normal. Wall thickness was   normal. Systolic function was normal. - Right atrium: The atrium was normal in size. - Tricuspid valve: There was mild regurgitation. - Pulmonic valve: Structurally normal valve. There was no   regurgitation. - Pulmonary arteries: Systolic pressure was mildly  increased. PA   peak pressure: 40 mm Hg (S). - Inferior vena cava: The vessel was normal in size. - Pericardium, extracardiac: There was no pericardial effusion.  Impressions:  - When compared to the prior study from 01/21/2013 LVEF appears   slightly worse estimated at 50-55% with mild diffuse hypokinesis.    Neuro/Psych PSYCHIATRIC DISORDERS Depression negative neurological ROS  negative psych ROS   GI/Hepatic negative GI ROS, Neg liver ROS,   Endo/Other  diabetes  Renal/GU   negative genitourinary   Musculoskeletal negative musculoskeletal ROS (+)   Abdominal   Peds negative pediatric ROS (+)  Hematology negative hematology ROS (+)   Anesthesia Other Findings   Reproductive/Obstetrics negative OB ROS                            Anesthesia Physical  Anesthesia Plan  ASA: III  Anesthesia Plan: General   Post-op Pain Management:    Induction: Intravenous  PONV Risk Score and Plan: 2 and Ondansetron and Dexamethasone  Airway Management Planned: LMA  Additional Equipment:   Intra-op Plan:   Post-operative Plan: Extubation in OR  Informed Consent: I have reviewed the patients History and Physical, chart, labs and discussed the procedure including the risks, benefits and alternatives for the proposed anesthesia with the patient or authorized representative who has indicated his/her understanding and acceptance.   Dental advisory given  Plan Discussed with: CRNA, Surgeon and Anesthesiologist  Anesthesia Plan Comments:        Anesthesia Quick Evaluation

## 2017-10-06 NOTE — Interval H&P Note (Signed)
History and Physical Interval Note:  10/06/2017 9:32 AM  Geoffrey Frank  has presented today for surgery, with the diagnosis of BLADDER TUMOR  The various methods of treatment have been discussed with the patient and family. After consideration of risks, benefits and other options for treatment, the patient has consented to  Procedure(s): TRANSURETHRAL RESECTION OF BLADDER TUMOR (TURBT) (N/A) as a surgical intervention .  The patient's history has been reviewed, patient examined, no change in status, stable for surgery.  I have reviewed the patient's chart and labs.  Questions were answered to the patient's satisfaction.     Lillette Boxer Illiana Losurdo

## 2017-10-06 NOTE — Anesthesia Postprocedure Evaluation (Signed)
Anesthesia Post Note  Patient: Geoffrey Frank  Procedure(s) Performed: CYSTOSCOPY WITH FULGERATION (N/A )     Patient location during evaluation: PACU Anesthesia Type: General Level of consciousness: sedated Pain management: pain level controlled Vital Signs Assessment: post-procedure vital signs reviewed and stable Respiratory status: spontaneous breathing and respiratory function stable Cardiovascular status: stable Postop Assessment: no apparent nausea or vomiting Anesthetic complications: no    Last Vitals:  Vitals:   10/06/17 1100 10/06/17 1111  BP: 137/72 (!) 158/81  Pulse: 69 71  Resp: 15 15  Temp:  36.6 C  SpO2: 98% 99%    Last Pain:  Vitals:   10/06/17 1111  TempSrc:   PainSc: 4                  Carrie Usery DANIEL

## 2017-10-06 NOTE — Op Note (Signed)
Preoperative diagnosis: History of bladder cancer with probable recurrence  Postoperative diagnosis: History of bladder cancer, no evidence of recurrence.  Bladder neck contracture  Principal procedure: Cystoscopy, dilatation of bladder neck contracture with Leander Rams sounds, fulguration of small bleeders  Surgeon: Caterine Mcmeans  Anesthesia: General with LMA  Complications: None  Specimens: None  Drains: 86 French Foley catheter, to leg bag  Indications: 84 year old male with known history of prostate cancer, being followed currently with stage disease.  He is asymptomatic from this, and currently off androgen deprivation therapy.  He has a Mo long history of bladder cancer, and in August 2018 upon routine follow-up with cystoscopy was found to have papillary recurrences in his dome.  This was verified by cystoscopy in November 2018.  He presents at this time for cystoscopy, TURBT.  The procedure as well as risks and complications have been discussed with the patient both at his prior November appointment and this morning.  These include but are not limited to bleeding, infection, need for catheter drainage, anesthetic complications, among others.  He desires to proceed.  Findings: Penile urethral normal.  Prostatic urethra was fixed, with a bladder neck contracture of approximately 18 [place].  The scope was passed through this.  Circumferential inspection of the bladder was performed with both the 30 and 70 degree lenses revealing multiple punctate erythematous areas consistent with glomerulations following bladder distention.  No overt urothelial lesions were seen, however.  Ureteral orifices were normal in configuration and location.  Description of procedure: The patient was properly identified in the holding area.  He received preoperative IV antibiotics.  He was taken to the operating room where general anesthetic was administered with the LMA.  He was placed in the dorsolithotomy position.   Genitalia and perineum were prepped and draped.  Proper timeout was performed.  A 21 French panendoscope was passed under direct vision through the urethra with the 12 degree lens.  The previously mentioned bladder neck contracture/urethral stricture was seen and passed with the beak of the scope.  Inspection of the bladder was then performed with the findings above noted.  I then removed the scope and dilated the patient's prostatic urethra to 30 [place] with [employer] sounds without difficulty.  I then replaced the scope and inspected the entire  urothelium with both the 12 and 70 degree lenses.  No overt bladder lesion/tumors were seen.  Following distention of the bladder, small bleeding glomerulations were seen.  The largest of these, 3-4, were cauterized with the Bugbee electrode until hemostasis was achieved.  At this point, there being no lesions to treat, the scope was removed.  I replaced an 34 French Foley catheter.  Balloon filled with 10 cc of water.  This was then hooked to a leg bag.  The patient was then awakened and taken to the PACU in stable condition.  He tolerated procedure well.

## 2017-10-06 NOTE — Discharge Instructions (Signed)
1. You may see some blood in the urine and may have some burning with urination for 48-72 hours. You also may notice that you have to urinate more frequently or urgently after your procedure which is normal.  2. You should call should you develop an inability urinate, fever > 101, persistent nausea and vomiting that prevents you from eating or drinking to stay hydrated.  3. If you have a catheter, you will be taught how to take care of the catheter by the nursing staff prior to discharge from the hospital.  You may periodically feel a strong urge to void with the catheter in place.  This is a bladder spasm and most often can occur when having a bowel movement or moving around. It is typically self-limited and usually will stop after a few minutes.  You may use some Vaseline or Neosporin around the tip of the catheter to reduce friction at the tip of the penis. You may also see some blood in the urine.  A very small amount of blood can make the urine look quite red.  As long as the catheter is draining well, there usually is not a problem.  However, if the catheter is not draining well and is bloody, you should call the office [phone]) to notify us.  It is okay to remove the catheter as instructed on Tuesday morning.     Post Anesthesia Home Care Instructions  Activity: Get plenty of rest for the remainder of the day. A responsible individual must stay with you for 24 hours following the procedure.  For the next 24 hours, DO NOT: -Drive a car -Paediatric nurse -Drink alcoholic beverages -Take any medication unless instructed by your physician -Make any legal decisions or sign important papers.  Meals: Start with liquid foods such as gelatin or soup. Progress to regular foods as tolerated. Avoid greasy, spicy, heavy foods. If nausea and/or vomiting occur, drink only clear liquids until the nausea and/or vomiting subsides. Call your physician if vomiting continues.  Special  Instructions/Symptoms: Your throat may feel dry or sore from the anesthesia or the breathing tube placed in your throat during surgery. If this causes discomfort, gargle with warm salt water. The discomfort should disappear within
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
## 2017-10-06 NOTE — Transfer of Care (Signed)
Immediate Anesthesia Transfer of Care Note  Patient: Geoffrey Frank  Procedure(s) Performed: [place] (N/A )  Patient Location: PACU  Anesthesia Type:General  Level of Consciousness: sedated  Airway & Oxygen Therapy: Patient Spontanous Breathing and Patient connected to face mask oxygen  Post-op Assessment: Report given to RN and Post -op Vital signs reviewed and stable  Post vital signs: Reviewed and stable  Last Vitals:  Vitals:   10/06/17 0739  BP: (!) 148/93  Pulse: 75  Resp: 18  Temp: 37 C  SpO2: 99%    Last Pain:  Vitals:   10/06/17 0739  TempSrc: Oral      Patients Stated Pain Goal: 4 (15/52/08 0223)  Complications: No apparent anesthesia complications

## 2017-10-07 ENCOUNTER — Encounter (HOSPITAL_COMMUNITY): Payer: Self-pay | Admitting: Urology

## 2017-11-21 ENCOUNTER — Encounter: Payer: Self-pay | Admitting: Internal Medicine

## 2017-11-21 ENCOUNTER — Ambulatory Visit: Payer: Medicare Other | Admitting: Internal Medicine

## 2017-11-21 VITALS — BP 130/76 | HR 70 | Ht 72.0 in | Wt 183.0 lb

## 2017-11-21 DIAGNOSIS — I10 Essential (primary) hypertension: Secondary | ICD-10-CM | POA: Diagnosis not present

## 2017-11-21 DIAGNOSIS — R600 Localized edema: Secondary | ICD-10-CM | POA: Diagnosis not present

## 2017-11-21 DIAGNOSIS — I471 Supraventricular tachycardia: Secondary | ICD-10-CM | POA: Diagnosis not present
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
## 2017-11-21 NOTE — Patient Instructions (Signed)
Your physician wants you to follow-up in: ONE YEAR with Dr. Hilty. You will receive a reminder letter in the mail two months in advance. If you don't receive a letter, please call our office to schedule the follow-up appointment.  

## 2018-03-28 ENCOUNTER — Other Ambulatory Visit: Payer: Self-pay | Admitting: Internal Medicine

## 2018-06-25 ENCOUNTER — Ambulatory Visit: Payer: Medicare Other | Admitting: Internal Medicine

## 2018-06-25 ENCOUNTER — Encounter: Payer: Self-pay | Admitting: Internal Medicine

## 2018-06-25 VITALS — BP 126/74 | HR 74 | Temp 97.5°F | Ht 72.0 in | Wt 176.4 lb

## 2018-06-25 DIAGNOSIS — I129 Hypertensive chronic kidney disease with stage 1 through stage 4 chronic kidney disease, or unspecified chronic kidney disease: Secondary | ICD-10-CM | POA: Diagnosis not present

## 2018-06-25 DIAGNOSIS — Z6823 Body mass index (BMI) 23.0-23.9, adult: Secondary | ICD-10-CM

## 2018-06-25 DIAGNOSIS — R7309 Other abnormal glucose: Secondary | ICD-10-CM

## 2018-06-25 DIAGNOSIS — R634 Abnormal weight loss: Secondary | ICD-10-CM

## 2018-06-25 DIAGNOSIS — L608 Other nail disorders: Secondary | ICD-10-CM

## 2018-06-25 DIAGNOSIS — F5101 Primary insomnia: Secondary | ICD-10-CM | POA: Diagnosis not present

## 2018-06-25 DIAGNOSIS — J301 Allergic rhinitis due to pollen: Secondary | ICD-10-CM

## 2018-06-25 DIAGNOSIS — N182 Chronic kidney disease, stage 2 (mild): Secondary | ICD-10-CM

## 2018-06-25 DIAGNOSIS — Z23 Encounter for immunization: Secondary | ICD-10-CM

## 2018-06-25 NOTE — Patient Instructions (Signed)

## 2018-06-26 LAB — CBC WITH DIFFERENTIAL/PLATELET
Basophils Absolute: 0 10*3/uL (ref 0.0–0.2)
Basos: 1 %
EOS (ABSOLUTE): 0.1 10*3/uL (ref 0.0–0.4)
Eos: 3 %
Hematocrit: 37.5 % (ref 37.5–51.0)
Hemoglobin: 11.1 g/dL — ABNORMAL LOW (ref 13.0–17.7)
Immature Grans (Abs): 0 10*3/uL (ref 0.0–0.1)
Immature Granulocytes: 0 %
Lymphocytes Absolute: 1.1 10*3/uL (ref 0.7–3.1)
Lymphs: 21 %
MCH: 22.4 pg — ABNORMAL LOW (ref 26.6–33.0)
MCHC: 29.6 g/dL — ABNORMAL LOW (ref 31.5–35.7)
MCV: 76 fL — ABNORMAL LOW (ref 79–97)
Monocytes Absolute: 0.7 10*3/uL (ref 0.1–0.9)
Monocytes: 13 %
Neutrophils Absolute: 3.3 10*3/uL (ref 1.4–7.0)
Neutrophils: 62 %
Platelets: 230 10*3/uL (ref 150–450)
RBC: 4.95 x10E6/uL (ref 4.14–5.80)
RDW: 14.4 % (ref 12.3–15.4)
WBC: 5.3 10*3/uL (ref 3.4–10.8)

## 2018-06-26 LAB — CMP14+EGFR
ALT: 13 IU/L (ref 0–44)
AST: 16 IU/L (ref 0–40)
Albumin/Globulin Ratio: 1.7 (ref 1.2–2.2)
Albumin: 4 g/dL (ref 3.2–4.6)
Alkaline Phosphatase: 42 IU/L (ref 39–117)
BUN/Creatinine Ratio: 18 (ref 10–24)
BUN: 18 mg/dL (ref 10–36)
Bilirubin Total: 0.5 mg/dL (ref 0.0–1.2)
CO2: 22 mmol/L (ref 20–29)
Calcium: 9.5 mg/dL (ref 8.6–10.2)
Chloride: 103 mmol/L (ref 96–106)
Creatinine, Ser: 1 mg/dL (ref 0.76–1.27)
GFR calc Af Amer: 76 mL/min/{1.73_m2} (ref >59)
GFR calc non Af Amer: 66 mL/min/{1.73_m2} (ref >59)
Globulin, Total: 2.3 g/dL (ref 1.5–4.5)
Glucose: 103 mg/dL — ABNORMAL HIGH (ref 65–99)
Potassium: 5 mmol/L (ref 3.5–5.2)
Sodium: 142 mmol/L (ref 134–144)
Total Protein: 6.3 g/dL (ref 6.0–8.5)

## 2018-06-26 LAB — HEMOGLOBIN A1C
Est. average glucose Bld gHb Est-mCnc: 123 mg/dL
Hgb A1c MFr Bld: 5.9 % — ABNORMAL HIGH (ref 4.8–5.6)

## 2018-06-26 LAB — TSH: TSH: 1.93 u[IU]/mL (ref 0.450–4.500)

## 2018-06-27 NOTE — Progress Notes (Signed)
Your blood count is stable.  Your thyroid fxn is normal. Your hba1c is 5.9, this is in prediabetes range. Your liver and kidney function are stable.

## 2018-07-19 ENCOUNTER — Encounter: Payer: Self-pay | Admitting: Internal Medicine
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
## 2018-11-23 ENCOUNTER — Ambulatory Visit: Payer: Medicare Other | Admitting: Internal Medicine

## 2018-11-24 ENCOUNTER — Encounter: Payer: Self-pay | Admitting: *Deleted

## 2018-12-17 ENCOUNTER — Other Ambulatory Visit: Payer: Self-pay | Admitting: Internal Medicine

## 2018-12-29 ENCOUNTER — Ambulatory Visit: Payer: Medicare Other | Admitting: Internal Medicine

## 2018-12-29 ENCOUNTER — Encounter: Payer: Self-pay | Admitting: Internal Medicine

## 2018-12-29 ENCOUNTER — Ambulatory Visit (INDEPENDENT_AMBULATORY_CARE_PROVIDER_SITE_OTHER): Payer: Medicare Other

## 2018-12-29 ENCOUNTER — Other Ambulatory Visit: Payer: Self-pay

## 2018-12-29 VITALS — BP 142/80 | HR 62 | Temp 97.6°F | Ht 65.6 in | Wt 178.0 lb

## 2018-12-29 VITALS — BP 142/80 | HR 62 | Temp 97.6°F | Ht 65.6 in | Wt 178.2 lb

## 2018-12-29 DIAGNOSIS — R202 Paresthesia of skin: Secondary | ICD-10-CM | POA: Diagnosis not present

## 2018-12-29 DIAGNOSIS — N182 Chronic kidney disease, stage 2 (mild): Secondary | ICD-10-CM

## 2018-12-29 DIAGNOSIS — J449 Chronic obstructive pulmonary disease, unspecified: Secondary | ICD-10-CM

## 2018-12-29 DIAGNOSIS — Z79899 Other long term (current) drug therapy: Secondary | ICD-10-CM

## 2018-12-29 DIAGNOSIS — L608 Other nail disorders: Secondary | ICD-10-CM

## 2018-12-29 DIAGNOSIS — Z0000 Encounter for general adult medical examination without abnormal findings: Secondary | ICD-10-CM

## 2018-12-29 DIAGNOSIS — I129 Hypertensive chronic kidney disease with stage 1 through stage 4 chronic kidney disease, or unspecified chronic kidney disease: Secondary | ICD-10-CM

## 2018-12-29 MED ORDER — MULTI-VITAMIN/MINERALS PO TABS: 1.0000 | ORAL_TABLET | Freq: Every day | ORAL | 2 refills | Status: DC
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
## 2018-12-29 NOTE — Patient Instructions (Signed)
Paresthesia Paresthesia is a burning or prickling feeling. This feeling can happen in any part of the body. It often happens in the hands, arms, legs, or feet. Usually, it is not painful. In most cases, the feeling goes away in a short time and is not a sign of a serious problem. If you have paresthesia that lasts a long time, you may need to be seen by your doctor. Follow these instructions at home: Alcohol use   Do not drink alcohol if: ? Your doctor tells you not to drink. ? You are pregnant, may be pregnant, or are planning to become pregnant.  If you drink alcohol, limit how much you have: ? 0-1 drink a day for women. ? 0-2 drinks a day for men.  Be aware of how much alcohol is in your drink. In the [place] one drink equals one typical bottle of beer (12 oz), one-half glass of wine (5 oz), or one shot of hard liquor (1 oz). Nutrition  Eat a healthy diet. This includes: ? Eating foods that have a lot of fiber in them, such as fresh fruits and vegetables, whole grains, and beans. ? Limiting foods that have a lot of fat and processed sugars in them, such as fried or sweet foods. General instructions  Take over-the-counter and prescription medicines only as told by your doctor.  Do not use any products that have nicotine or tobacco in them, such as cigarettes and e-cigarettes. If you need help quitting, ask your doctor.  If you have diabetes, work with your doctor to make sure your blood sugar stays in a healthy range.  If your feet feel numb: ? Check for redness, warmth, and swelling every day. ? Wear padded socks and comfortable shoes. These help protect your feet.  Keep all follow-up visits as told by your doctor. This is important. Contact a doctor if:  You have paresthesia that gets worse or does not go away.  Your burning or prickling feeling gets worse when you walk.  You have pain or cramps.  You feel dizzy.  You have a rash. Get help right away if you:  Feel  weak.  Have trouble walking or moving.  Have problems speaking, understanding, or seeing.  Feel confused.  Cannot control when you pee (urinate) or poop (have a bowel movement).  Lose feeling (have numbness) after an injury.  Have new weakness in an arm or leg.  Pass out (faint). Summary  Paresthesia is a burning or prickling feeling. It often happens in the hands, arms, legs, or feet.  In most cases, the feeling goes away in a short time and is not a sign of a serious problem.  If you have paresthesia that lasts a long time, you may need to be seen by your doctor. This information is not intended to replace advice given to you by your health care provider. Make sure you discuss any questions you have with your health care provider. Document Released: 08/08/2008 Document Revised: 09/04/2017 Document Reviewed: 09/04/2017 Elsevier Interactive Patient Education  2019 Elsevier Inc.  

## 2018-12-29 NOTE — Patient Instructions (Signed)
Geoffrey Frank , Thank you for taking time to come for your Medicare Wellness Visit. I appreciate your ongoing commitment to your health goals. Please review the following plan we discussed and let me know if I can assist you in the future.   Screening recommendations/referrals: Colonoscopy: not required Recommended yearly ophthalmology/optometry visit for glaucoma screening and checkup Recommended yearly dental visit for hygiene and checkup  Vaccinations: Influenza vaccine: 06/2018 Pneumococcal vaccine: not interested in prevnar 13 Tdap vaccine: 08/2013 Shingles vaccine: declines    Advanced directives: Please bring a copy of your POA (Power of Attorney) and/or Living Will to your next appointment.    Conditions/risks identified: Overweight  Next appointment: 06/30/2019 at 1145  Preventive Care 83 Years and Older, Male Preventive care refers to lifestyle choices and visits with your health care provider that can promote health and wellness. What does preventive care include?  A yearly physical exam. This is also called an annual well check.  Dental exams once or twice a year.  Routine eye exams. Ask your health care provider how often you should have your eyes checked.  Personal lifestyle choices, including:  Daily care of your teeth and gums.  Regular physical activity.  Eating a healthy diet.  Avoiding tobacco and drug use.  Limiting alcohol use.  Practicing safe sex.  Taking low doses of aspirin every day.  Taking vitamin and mineral supplements as recommended by your health care provider. What happens during an annual well check? The services and screenings done by your health care provider during your annual well check will depend on your age, overall health, lifestyle risk factors, and family history of disease. Counseling  Your health care provider may ask you questions about your:  Alcohol use.  Tobacco use.  Drug use.  Emotional well-being.  Home and  relationship well-being.  Sexual activity.  Eating habits.  History of falls.  Memory and ability to understand (cognition).  Work and work Statistician. Screening  You may have the following tests or measurements:  Height, weight, and BMI.  Blood pressure.  Lipid and cholesterol levels. These may be checked every 5 years, or more frequently if you are over 31 years old.  Skin check.  Lung cancer screening. You may have this screening every year starting at age 61 if you have a 30-pack-year history of smoking and currently smoke or have quit within the past 15 years.  Fecal occult blood test (FOBT) of the stool. You may have this test every year starting at age 83.  Flexible sigmoidoscopy or colonoscopy. You may have a sigmoidoscopy every 5 years or a colonoscopy every 10 years starting at age 83.  Prostate cancer screening. Recommendations will vary depending on your family history and other risks.  Hepatitis C blood test.  Hepatitis B blood test.  Sexually transmitted disease (STD) testing.  Diabetes screening. This is done by checking your blood sugar (glucose) after you have not eaten for a while (fasting). You may have this done every 1-3 years.  Abdominal aortic aneurysm (AAA) screening. You may need this if you are a current or former smoker.  Osteoporosis. You may be screened starting at age 9 if you are at high risk. Talk with your health care provider about your test results, treatment options, and if necessary, the need for more tests. Vaccines  Your health care provider may recommend certain vaccines, such as:  Influenza vaccine. This is recommended every year.  Tetanus, diphtheria, and acellular pertussis (Tdap, Td) vaccine. You may  need a Td booster every 10 years.  Zoster vaccine. You may need this after age 60.  Pneumococcal 13-valent conjugate (PCV13) vaccine. One dose is recommended after age 31.  Pneumococcal polysaccharide (PPSV23) vaccine. One  dose is recommended after age 33. Talk to your health care provider about which screenings and vaccines you need and how often you need them. This information is not intended to replace advice given to you by your health care provider. Make sure you discuss any questions you have with your health care provider. Document Released: 09/22/2015 Document Revised: 05/15/2016 Document Reviewed: 06/27/2015 Elsevier Interactive Patient Education  2017 [place] Prevention in the Home Falls can cause injuries. They can happen to people of all ages. There are many things you can do to make your home safe and to help prevent falls. What can I do on the outside of my home?  Regularly fix the edges of walkways and driveways and fix any cracks.  Remove anything that might make you trip as you walk through a door, such as a raised step or threshold.  Trim any bushes or trees on the path to your home.  Use bright outdoor lighting.  Clear any walking paths of anything that might make someone trip, such as rocks or tools.  Regularly check to see if handrails are loose or broken. Make sure that both sides of any steps have handrails.  Any raised decks and porches should have guardrails on the edges.  Have any leaves, snow, or ice cleared regularly.  Use sand or salt on walking paths during winter.  Clean up any spills in your garage right away. This includes oil or grease spills. What can I do in the bathroom?  Use night lights.  Install grab bars by the toilet and in the tub and shower. Do not use towel bars as grab bars.  Use non-skid mats or decals in the tub or shower.  If you need to sit down in the shower, use a plastic, non-slip stool.  Keep the floor dry. Clean up any water that spills on the floor as soon as it happens.  Remove soap buildup in the tub or shower regularly.  Attach bath mats securely with double-sided non-slip rug tape.  Do not have throw rugs and other  things on the floor that can make you trip. What can I do in the bedroom?  Use night lights.  Make sure that you have a light by your bed that is easy to reach.  Do not use any sheets or blankets that are too big for your bed. They should not hang down onto the floor.  Have a firm chair that has side arms. You can use this for support while you get dressed.  Do not have throw rugs and other things on the floor that can make you trip. What can I do in the kitchen?  Clean up any spills right away.  Avoid walking on wet floors.  Keep items that you use a lot in easy-to-reach places.  If you need to reach something above you, use a strong step stool that has a grab bar.  Keep electrical cords out of the way.  Do not use floor polish or wax that makes floors slippery. If you must use wax, use non-skid floor wax.  Do not have throw rugs and other things on the floor that can make you trip. What can I do with my stairs?  Do not leave any items on  the stairs.  Make sure that there are handrails on both sides of the stairs and use them. Fix handrails that are broken or loose. Make sure that handrails are as long as the stairways.  Check any carpeting to make sure that it is firmly attached to the stairs. Fix any carpet that is loose or worn.  Avoid having throw rugs at the top or bottom of the stairs. If you do have throw rugs, attach them to the floor with carpet tape.  Make sure that you have a light switch at the top of the stairs and the bottom of the stairs. If you do not have them, ask someone to add them for you. What else can I do to help prevent falls?  Wear shoes that:  Do not have high heels.  Have rubber bottoms.  Are comfortable and fit you well.  Are closed at the toe. Do not wear sandals.  If you use a stepladder:  Make sure that it is fully opened. Do not climb a closed stepladder.  Make sure that both sides of the stepladder are locked into place.  Ask  someone to hold it for you, if possible.  Clearly mark and make sure that you can see:  Any grab bars or handrails.  First and last steps.  Where the edge of each step is.  Use tools that help you move around (mobility aids) if they are needed. These include:  Canes.  Walkers.  Scooters.  Crutches.  Turn on the lights when you go into a dark area. Replace any light bulbs as soon as they burn out.  Set up your furniture so you have a clear path. Avoid moving your furniture around.  If any of your floors are uneven, fix them.  If there are any pets around you, be aware of where they are.  Review your medicines with your doctor. Some medicines can make you feel dizzy. This can increase your chance of falling. Ask your doctor what other things that you can do to help prevent falls. This information is not intended to replace advice given to you by your health care provider. Make sure you discuss any questions you have with your health care provider. Document Released: 06/22/2009 Document Revised: 02/01/2016 Document Reviewed: 09/30/2014 Elsevier Interactive Patient Education  2017 [employer].

## 2018-12-30 ENCOUNTER — Ambulatory Visit (INDEPENDENT_AMBULATORY_CARE_PROVIDER_SITE_OTHER): Payer: Medicare Other

## 2018-12-30 ENCOUNTER — Ambulatory Visit: Payer: Medicare Other | Admitting: Internal Medicine

## 2018-12-30 ENCOUNTER — Ambulatory Visit: Payer: Medicare Other

## 2018-12-30 DIAGNOSIS — N182 Chronic kidney disease, stage 2 (mild): Secondary | ICD-10-CM | POA: Diagnosis not present

## 2018-12-30 DIAGNOSIS — J449 Chronic obstructive pulmonary disease, unspecified: Secondary | ICD-10-CM | POA: Diagnosis not present

## 2018-12-30 DIAGNOSIS — I129 Hypertensive chronic kidney disease with stage 1 through stage 4 chronic kidney disease, or unspecified chronic kidney disease: Secondary | ICD-10-CM | POA: Diagnosis not present

## 2018-12-30 LAB — BMP8+EGFR
BUN/Creatinine Ratio: 23 (ref 10–24)
BUN: 23 mg/dL (ref 10–36)
CO2: 23 mmol/L (ref 20–29)
Calcium: 9.5 mg/dL (ref 8.6–10.2)
Chloride: 105 mmol/L (ref 96–106)
Creatinine, Ser: 1 mg/dL (ref 0.76–1.27)
GFR calc Af Amer: 76 mL/min/{1.73_m2} (ref >59)
GFR calc non Af Amer: 66 mL/min/{1.73_m2} (ref >59)
Glucose: 100 mg/dL — ABNORMAL HIGH (ref 65–99)
Potassium: 4.9 mmol/L (ref 3.5–5.2)
Sodium: 144 mmol/L (ref 134–144)

## 2018-12-30 LAB — CBC
Hematocrit: 34.1 % — ABNORMAL LOW (ref 37.5–51.0)
Hemoglobin: 10.4 g/dL — ABNORMAL LOW (ref 13.0–17.7)
MCH: 23 pg — ABNORMAL LOW (ref 26.6–33.0)
MCHC: 30.5 g/dL — ABNORMAL LOW (ref 31.5–35.7)
MCV: 75 fL — ABNORMAL LOW (ref 79–97)
Platelets: 191 10*3/uL (ref 150–450)
RBC: 4.52 x10E6/uL (ref 4.14–5.80)
RDW: 14.3 % (ref 11.6–15.4)
WBC: 4.8 10*3/uL (ref 3.4–10.8)

## 2018-12-30 LAB — VITAMIN B12: Vitamin B-12: 378 pg/mL (ref 232–1245)

## 2018-12-30 NOTE — Chronic Care Management (AMB) (Signed)
Chronic Care Management    Clinical Social Work General Note  12/30/2018 Name: Geoffrey Frank MRN: [id number] DOB: 07/31/1926  Geoffrey Frank is a 85 y.o. year old male who is a primary care patient of Glendale Chard, MD. The CCM was consulted to assist the patient with care coordination of resource needs.  [name] was given information about Chronic Care Management services today including:  1. CCM service includes personalized support from designated clinical staff supervised by his physician, including individualized plan of care and coordination with other care providers 2. 24/7 contact phone numbers for assistance for urgent and routine care needs. 3. Service will only be billed when office clinical staff spend 20 minutes or more in a month to coordinate care. 4. Only one practitioner may furnish and bill the service in a calendar month. 5. The patient may stop CCM services at any time (effective at the end of the month) by phone call to the office staff. 6. The patient will be responsible for cost sharing (co-pay) of up to 20% of the service fee (after annual deductible is met).  Patient agreed to services and verbal consent obtained.   Review of patient status, including review of consultants reports, relevant laboratory and other test results, and collaboration with appropriate care team members and the patient's provider was performed as part of comprehensive patient evaluation and provision of chronic care management services.    SDOH (Social Determinants of Health) screening performed today. See Care Plan Entry related to challenges with: Depression    Goals Addressed            This Visit's Progress     Patient Stated   . "I want to speak with someone about my depression" (pt-stated)       Current Barriers:  . Social Isolation . Inability to perform IADL's independently  . Embarrassment in physical decline related to aging and medical conditions   Clinical Social  Work Clinical Goal(s):  [name] Over the next 30 days, client will follow up with a licensed professional as directed by SW  Interventions: . Patient interviewed and appropriate assessments performed . Collaborated with RN Case Manager re: patient stated goal  . Reviewed health plan benefits with the patient . Encouraged the patient to participate with SW on a call to the patients health plan behavioral health phone line - patient declined . Provided SW contact number for the patient to utilize if needed prior to next scheduled call . Planned call with the patient in the next week to continue discussing mental health resources  Patient Self Care Activities:  . Self administers medications as prescribed . Attends all scheduled provider appointments . Calls provider office for new concerns or questions  Initial goal documentation     . "I would like meal options for the future" (pt-stated)       Current Barriers:  . ADL IADL limitations . Lacks knowledge of community resource: "Meals on Wheels"  Clinical Social Work Clinical Goal(s):  [name] Over the next 10 days, patient will be placed on the waiting list to receive mobile meals.  Interventions: . Patient interviewed and appropriate assessments performed . Provided patient with information about mobile meal program for [employer] through [employer] . Discussed plans with patient for ongoing care management follow up and provided patient with direct contact information for care management team  . Placed referral to mobile meals via [id number] platform  Patient Self Care Activities:  . Performs ADL's  independently . Performs IADL's independently . Calls provider office for new concerns or questions  Initial goal documentation     . "I'm slowing down tremendously and may need some help in the future" (pt-stated)       Current Barriers:  . Level of care concerns . ADL IADL limitations . Social Isolation . Limited  education about long term care options such as independent living and assisted living  Clinical Social Work Clinical Goal(s):  [name] Over the next 30 days, patient will be educated on long term care options and the difference between independent living and assisted living communities.  Interventions: . Patient interviewed and appropriate assessments performed . Discussed plans with patient for ongoing care management follow up and provided patient with direct contact information for care management team  . Educated the patient on long term care options within [place] . Mailed the patient information on the difference between independent and assisted living communities  Patient Self Care Activities:  . Self administers medications as prescribed . Attends all scheduled provider appointments . Calls pharmacy for medication refills . Calls provider office for new concerns or questions  Initial goal documentation        Follow Up Plan: SW will follow up with patient by phone over the next week.       [name], BSW, CDP [facility] / [facility] Care Management Social Worker [phone]  Total time spent performing care coordination and/or care management activities with the patient by phone or face to face = 40 minutes.

## 2018-12-30 NOTE — Chronic Care Management (AMB) (Signed)
Chronic Care Management   Initial Visit Note  12/30/2018 Name: Geoffrey Frank MRN: [id number] DOB: 04/30/1926  Referred by: Glendale Chard, MD Reason for referral : Geoffrey Frank is a 85 y.o. year old male who is a primary care patient of Glendale Chard, MD. The CCM team was consulted for assistance with chronic disease management and care coordination needs.   Review of patient status, including review of consultants reports, relevant laboratory and other test results, and collaboration with appropriate care team members and the patient's provider was performed as part of comprehensive patient evaluation and provision of chronic care management services.    I initiated and established the plan of care for Geoffrey Frank during one on one collaboration with my clinical care management colleague [name] BSW who is also engaged with this patient to address social work needs.   Goals Addressed      Patient Stated   . "I want to speak with someone about my depression" (pt-stated)       Current Barriers:  . Social Isolation . Inability to perform IADL's independently  . Embarrassment in physical decline related to aging and medical conditions   Clinical Social Work Clinical Goal(s):  Geoffrey Frank Over the next 30 days, client will follow up with a licensed professional as directed by SW  Interventions: . Patient interviewed and appropriate assessments performed . Collaborated with RN Case Manager re: patient stated goal  . Reviewed health plan benefits with the patient . Encouraged the patient to participate with SW on a call to the patients health plan behavioral health phone line - patient declined . Provided SW contact number for the patient to utilize if needed prior to next scheduled call . Planned call with the patient in the next week to continue discussing mental health resources  Patient Self Care Activities:  . Self administers medications as prescribed .  Attends all scheduled provider appointments . Calls provider office for new concerns or questions  Initial goal documentation    . "I would like meal options for the future" (pt-stated)       Current Barriers:  . ADL IADL limitations . Lacks knowledge of community resource: "Meals on Wheels"  Clinical Social Work Clinical Goal(s):  Geoffrey Frank Over the next 10 days, patient will be placed on the waiting list to receive mobile meals.  Interventions: . Patient interviewed and appropriate assessments performed . Provided patient with information about mobile meal program for [employer] through [employer] . Discussed plans with patient for ongoing care management follow up and provided patient with direct contact information for care management team  . Placed referral to mobile meals via [id number] platform  Patient Self Care Activities:  . Performs ADL's independently . Performs IADL's independently . Calls provider office for new concerns or questions  Initial goal documentation    . "I'm slowing down tremendously and may need some help in the future" (pt-stated)       Current Barriers:  . Level of care concerns . ADL IADL limitations . Social Isolation . Limited education about long term care options such as independent living and assisted living  Clinical Social Work Clinical Goal(s):  Geoffrey Frank Over the next 30 days, patient will be educated on long term care options and the difference between independent living and assisted living communities.  Interventions: . Patient interviewed and appropriate assessments performed . Discussed plans with patient for ongoing care management follow up and provided patient  with direct contact information for care management team  . Educated the patient on long term care options within [place] . Mailed the patient information on the difference between independent and assisted living communities  Patient Self Care Activities:  . Self  administers medications as prescribed . Attends all scheduled provider appointments . Calls pharmacy for medication refills . Calls provider office for new concerns or questions  Initial goal documentation      Other   . Assist with Disease Management and Community Resources       Current Barriers:  Geoffrey Frank Knowledge Barriers related to resources and support available to address needs related to disease management and community resources.  Case Manager Clinical Goal(s):  Geoffrey Frank Over the next 30 days, patient will work with the CCM team to address needs related to depression, meal options, long term plan for housing and disease process and self health management of urinary/bladder issues.   Interventions:  . Collaborated with BSW and initiated plan of care to address needs related to community resources and patient specified disease management.   Patient Self Care Activities:  . Self administers medications as prescribed . Attends all scheduled provider appointments . Calls provider office for new concerns or questions  Initial goal documentation        Telephone follow up appointment with CCM team member scheduled for: week of 01/04/19  Geoffrey Frank, [facility] Care Management Coordinator [facility]  Direct Phone: [phone]

## 2018-12-30 NOTE — Patient Instructions (Signed)
Social Worker Visit Information  Goals we discussed today:  Goals Addressed            This Visit's Progress     Patient Stated   . "I want to speak with someone about my depression" (pt-stated)       Current Barriers:  . Social Isolation . Inability to perform IADL's independently  . Embarrassment in physical decline related to aging and medical conditions   Clinical Social Work Clinical Goal(s):  [name] Over the next 30 days, client will follow up with a licensed professional as directed by SW  Interventions: . Patient interviewed and appropriate assessments performed . Collaborated with RN Case Manager re: patient stated goal  . Reviewed health plan benefits with the patient . Encouraged the patient to participate with SW on a call to the patients health plan behavioral health phone line - patient declined . Provided SW contact number for the patient to utilize if needed prior to next scheduled call . Planned call with the patient in the next week to continue discussing mental health resources  Patient Self Care Activities:  . Self administers medications as prescribed . Attends all scheduled provider appointments . Calls provider office for new concerns or questions  Initial goal documentation     . "I would like meal options for the future" (pt-stated)       Current Barriers:  . ADL IADL limitations . Lacks knowledge of community resource: "Meals on Wheels"  Clinical Social Work Clinical Goal(s):  [name] Over the next 10 days, patient will be placed on the waiting list to receive mobile meals.  Interventions: . Patient interviewed and appropriate assessments performed . Provided patient with information about mobile meal program for [employer] through [employer] . Discussed plans with patient for ongoing care management follow up and provided patient with direct contact information for care management team  . Placed referral to mobile meals via  [id number] platform  Patient Self Care Activities:  . Performs ADL's independently . Performs IADL's independently . Calls provider office for new concerns or questions  Initial goal documentation     . "I'm slowing down tremendously and may need some help in the future" (pt-stated)       Current Barriers:  . Level of care concerns . ADL IADL limitations . Social Isolation . Limited education about long term care options such as independent living and assisted living  Clinical Social Work Clinical Goal(s):  [name] Over the next 30 days, patient will be educated on long term care options and the difference between independent living and assisted living communities.  Interventions: . Patient interviewed and appropriate assessments performed . Discussed plans with patient for ongoing care management follow up and provided patient with direct contact information for care management team  . Educated the patient on long term care options within [place] . Mailed the patient information on the difference between independent and assisted living communities  Patient Self Care Activities:  . Self administers medications as prescribed . Attends all scheduled provider appointments . Calls pharmacy for medication refills . Calls provider office for new concerns or questions  Initial goal documentation         Materials provided: Yes: Mailed the patient information on levels of care  Mr. Cush was given information about Chronic Care Management services today including:  1. CCM service includes personalized support from designated clinical staff supervised by his physician, including individualized plan of care and coordination with other care  providers 2. 24/7 contact phone numbers for assistance for urgent and routine care needs. 3. Service will only be billed when office clinical staff spend 20 minutes or more in a month to coordinate care. 4. Only one practitioner may furnish and bill  the service in a calendar month. 5. The patient may stop CCM services at any time (effective at the end of the month) by phone call to the office staff. 6. The patient will be responsible for cost sharing (co-pay) of up to 20% of the service fee (after annual deductible is met).  Patient agreed to services and verbal consent obtained.   The patient verbalized understanding of instructions provided today and declined a print copy of patient instruction materials.   Follow up plan: SW will follow up with patient by phone over the next week.   [name], BSW, CDP [facility] / [facility] Care Management Social Worker [phone]

## 2019-01-01 LAB — SPECIMEN STATUS REPORT

## 2019-01-01 LAB — IRON AND TIBC
Iron Saturation: 17 % (ref 15–55)
Iron: 48 ug/dL (ref 38–169)
Total Iron Binding Capacity: 280 ug/dL (ref 250–450)
UIBC: 232 ug/dL (ref 111–343)

## 2019-01-01 LAB — FERRITIN: Ferritin: 74 ng/mL (ref 30–400)
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
## 2019-01-06 ENCOUNTER — Telehealth: Payer: Self-pay

## 2019-01-07 ENCOUNTER — Ambulatory Visit: Payer: Self-pay

## 2019-01-07 ENCOUNTER — Telehealth: Payer: Self-pay

## 2019-01-07 DIAGNOSIS — I129 Hypertensive chronic kidney disease with stage 1 through stage 4 chronic kidney disease, or unspecified chronic kidney disease: Secondary | ICD-10-CM

## 2019-01-07 DIAGNOSIS — J449 Chronic obstructive pulmonary disease, unspecified: Secondary | ICD-10-CM

## 2019-01-07 DIAGNOSIS — N182 Chronic kidney disease, stage 2 (mild): Secondary | ICD-10-CM

## 2019-01-07 NOTE — Chronic Care Management (AMB) (Signed)
  Chronic Care Management    Clinical Social Work Follow Up Note  01/07/2019 Name: Geoffrey Frank MRN: [id number] DOB: [birth year]  Geoffrey Frank is a 85 y.o. year old male who is a primary care patient of Glendale Chard, MD. The CCM team was consulted for assistance with Level of Care Concerns.    Review of patient status, including review of consultants reports, other relevant assessments, and collaboration with appropriate care team members and the patient's provider was performed as part of comprehensive patient evaluation and provision of chronic care management services.     Goals Addressed            This Visit's Progress     Patient Stated   . COMPLETED: "I would like meal options for the future" (pt-stated)       Current Barriers:  . ADL IADL limitations . Lacks knowledge of community resource: "Meals on Wheels"  Clinical Social Work Clinical Goal(s):  [name] Over the next 10 days, patient will be placed on the waiting list to receive mobile meals.  Interventions: . Received notification the patients referral to mobile meals was approved and the patient has been placed on the waiting list . Telephonic CCM SW follow up to the patient to communicate this update  Patient Self Care Activities:  . Performs ADL's independently . Performs IADL's independently . Calls provider office for new concerns or questions  Please see past updates related to this goal by clicking on the "Past Updates" button in the selected goal      . "I'm slowing down tremendously and may need some help in the future" (pt-stated)       Current Barriers:  . Level of care concerns . ADL IADL limitations . Social Isolation . Limited education about long term care options such as independent living and assisted living  Clinical Social Work Clinical Goal(s):  [name] Over the next 30 days, patient will be educated on long term care options and the difference between independent living and assisted living  communities.  Interventions: . Telephonic by CCM SW to confirm receipt of mailed resources . Educated the patient on the difference between [place] and Assisted Living . Determined the patient is interested in independent living but is concerned he will be unable to afford . Informed the patient there are low income options available for independent living in [place]  . Mailed the patient information on [employer] for the patients review  Patient Self Care Activities:  . Self administers medications as prescribed . Attends all scheduled provider appointments . Calls pharmacy for medication refills . Calls provider office for new concerns or questions  Please see past updates related to this goal by clicking on the "Past Updates" button in the selected goal        Follow Up Plan: SW will follow up with patient by phone over the next two weeks   [name], BSW, CDP [facility] / [place] Worker [phone]  Total time spent performing care coordination and/or care management activities with the patient by phone or face to face =  40 minutes.

## 2019-01-07 NOTE — Patient Instructions (Signed)
Social Worker Visit Information  Goals we discussed today:  Goals Addressed            This Visit's Progress     Patient Stated   . COMPLETED: "I would like meal options for the future" (pt-stated)       Current Barriers:  . ADL IADL limitations . Lacks knowledge of community resource: "Meals on Wheels"  Clinical Social Work Clinical Goal(s):  [name] Over the next 10 days, patient will be placed on the waiting list to receive mobile meals.  Interventions: . Received notification the patients referral to mobile meals was approved and the patient has been placed on the waiting list . Telephonic CCM SW follow up to the patient to communicate this update  Patient Self Care Activities:  . Performs ADL's independently . Performs IADL's independently . Calls provider office for new concerns or questions  Please see past updates related to this goal by clicking on the "Past Updates" button in the selected goal      . "I'm slowing down tremendously and may need some help in the future" (pt-stated)       Current Barriers:  . Level of care concerns . ADL IADL limitations . Social Isolation . Limited education about long term care options such as independent living and assisted living  Clinical Social Work Clinical Goal(s):  [name] Over the next 30 days, patient will be educated on long term care options and the difference between independent living and assisted living communities.  Interventions: . Telephonic by CCM SW to confirm receipt of mailed resources . Educated the patient on the difference between [place] and Assisted Living . Determined the patient is interested in independent living but is concerned he will be unable to afford . Informed the patient there are low income options available for independent living in [place]  . Mailed the patient information on [facility] for the patients review  Patient Self Care Activities:  . Self administers medications as  prescribed . Attends all scheduled provider appointments . Calls pharmacy for medication refills . Calls provider office for new concerns or questions  Please see past updates related to this goal by clicking on the "Past Updates" button in the selected goal          Materials Provided: Yes: Provided resources via mail  Follow Up Plan: SW will follow up with patient by phone over the next two weeks   [name], [place], CDP [facility] / [place] Worker [phone]

## 2019-01-11 ENCOUNTER — Telehealth: Payer: Self-pay

## 2019-01-15 ENCOUNTER — Telehealth: Payer: Self-pay

## 2019-01-20 ENCOUNTER — Telehealth: Payer: Self-pay | Admitting: Internal Medicine

## 2019-01-21 ENCOUNTER — Telehealth (INDEPENDENT_AMBULATORY_CARE_PROVIDER_SITE_OTHER): Payer: Medicare Other | Admitting: Internal Medicine

## 2019-01-21 ENCOUNTER — Encounter: Payer: Self-pay | Admitting: Internal Medicine

## 2019-01-21 VITALS — Ht 72.0 in | Wt 174.0 lb

## 2019-01-21 DIAGNOSIS — I10 Essential (primary) hypertension: Secondary | ICD-10-CM

## 2019-01-21 DIAGNOSIS — I471 Supraventricular tachycardia: Secondary | ICD-10-CM

## 2019-01-21 DIAGNOSIS — Z7189 Other specified counseling: Secondary | ICD-10-CM

## 2019-01-21 DIAGNOSIS — R600 Localized edema: Secondary | ICD-10-CM | POA: Diagnosis not present
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
## 2019-01-21 NOTE — Patient Instructions (Signed)
Medication Instructions:  Your physician recommends that you continue on your current medications as directed. Please refer to the Current Medication list given to you today.  If you need a refill on your cardiac medications before your next appointment, please call your pharmacy.   Lab work: NONE If you have labs (blood work) drawn today and your tests are completely normal, you will receive your results only by: [name] MyChart Message (if you have MyChart) OR . A paper copy in the mail If you have any lab test that is abnormal or we need to change your treatment, we will call you to review the results.  Testing/Procedures: NONE  Follow-Up: At [facility], you and your health needs are our priority.  As part of our continuing mission to provide you with exceptional heart care, we have created designated Provider Care Teams.  These Care Teams include your primary Cardiologist (physician) and Advanced Practice Providers (APPs -  Physician Assistants and Nurse Practitioners) who all work together to provide you with the care you need, when you need it. You will need a follow up appointment in 12 months.  Please call our office 2 months in advance to schedule this appointment.  You may see Pixie Casino, MD or one of the following Advanced Practice Providers on your designated Care Team: [place], [place] . Fabian Sharp, PA-C  Any Other Special Instructions Will Be Listed Below (If Applicable).

## 2019-01-22 ENCOUNTER — Ambulatory Visit: Payer: Medicare Other | Admitting: Internal Medicine

## 2019-01-25 ENCOUNTER — Other Ambulatory Visit: Payer: Self-pay | Admitting: Internal Medicine

## 2019-01-28 ENCOUNTER — Telehealth: Payer: Self-pay

## 2019-01-29 ENCOUNTER — Other Ambulatory Visit: Payer: Self-pay | Admitting: Internal Medicine

## 2019-02-02 ENCOUNTER — Ambulatory Visit: Payer: Self-pay

## 2019-02-02 ENCOUNTER — Telehealth: Payer: Self-pay

## 2019-02-02 DIAGNOSIS — J449 Chronic obstructive pulmonary disease, unspecified: Secondary | ICD-10-CM

## 2019-02-02 DIAGNOSIS — N182 Chronic kidney disease, stage 2 (mild): Secondary | ICD-10-CM

## 2019-02-02 DIAGNOSIS — I129 Hypertensive chronic kidney disease with stage 1 through stage 4 chronic kidney disease, or unspecified chronic kidney disease: Secondary | ICD-10-CM

## 2019-02-02 NOTE — Chronic Care Management (AMB) (Addendum)
  Chronic Care Management   Outreach Note  02/02/2019 Name: Geoffrey Frank MRN: [id number] DOB: [birth year]  Referred by: Glendale Chard, MD Reason for referral : Chronic Care Management (INITIAL CM RN Telephone Follow Up )   An unsuccessful telephone outreach was attempted today. The patient was referred to the case management team by Glendale Chard MD for assistance with chronic care management and care coordination. I spoke with [name] by telephone today to assess for RN CM needs. [name] requested a call back due to stating now is not a good time.   Follow Up Plan: The CM team will reach out to the patient again over the next 5-7 days.    [name], RN,CCM Care Management Coordinator [place] [facility]  Direct Phone: [phone]

## 2019-02-08 ENCOUNTER — Telehealth: Payer: Self-pay

## 2019-02-09 ENCOUNTER — Telehealth: Payer: Self-pay

## 2019-02-22 ENCOUNTER — Ambulatory Visit: Payer: Self-pay

## 2019-02-22 DIAGNOSIS — N182 Chronic kidney disease, stage 2 (mild): Secondary | ICD-10-CM

## 2019-02-22 DIAGNOSIS — J449 Chronic obstructive pulmonary disease, unspecified: Secondary | ICD-10-CM

## 2019-02-22 NOTE — Patient Instructions (Signed)
Social Worker Visit Information  Goals we discussed today:  Goals Addressed            This Visit's Progress     Patient Stated   . "I'm slowing down tremendously and may need some help in the future" (pt-stated)       Current Barriers:  . Level of care concerns . ADL IADL limitations . Social Isolation . Limited education about long term care options such as independent living and assisted living  Clinical Social Work Clinical Goal(s):  [name] Over the next 30 days, patient will be educated on long term care options and the difference between independent living and assisted living communities. Not met; re-established goal 6/15  Interventions: . Inbound call from the patient who confirms receipt of previously mailed resources . Assessed for the patients interest in touring independent living communities . Determined the patient is recovering from a recent pneumonia infection and has been unable to follow up on resource. The patient reports he has completed a round of antibiotics and is scheduled for a follow up x-ray on June 19th to confirm infections has cleared . Scheduled follow up call to the patient to review materials in the next 10-14 days  Patient Self Care Activities:  . Self administers medications as prescribed . Attends all scheduled provider appointments . Calls pharmacy for medication refills . Calls provider office for new concerns or questions  Please see past updates related to this goal by clicking on the "Past Updates" button in the selected goal          Materials Provided: No: Patient declined  Follow Up Plan: SW will follow up with patient by phone over the next 10-14 days   [name], BSW, CDP Social Worker, Certified Dementia Practitioner [place] / [place] [facility] [phone]

## 2019-02-22 NOTE — Chronic Care Management (AMB) (Signed)
  Chronic Care Management   Social Work Follow Up Note  02/22/2019 Name: Geoffrey Frank MRN: [id number] DOB: 1925-12-07  Geoffrey Frank is a 85 y.o. year old male who is a primary care patient of Glendale Chard, MD. The CCM team was consulted for assistance with Community Resources and Level of Care Concerns.   Review of patient status, including review of consultants reports, other relevant assessments, and collaboration with appropriate care team members and the patient's provider was performed as part of comprehensive patient evaluation and provision of chronic care management services.     Goals Addressed            This Visit's Progress     Patient Stated   . "I'm slowing down tremendously and may need some help in the future" (pt-stated)       Current Barriers:  . Level of care concerns . ADL IADL limitations . Social Isolation . Limited education about long term care options such as independent living and assisted living  Clinical Social Work Clinical Goal(s):  [name] Over the next 30 days, patient will be educated on long term care options and the difference between independent living and assisted living communities. Not met; re-established goal 6/15  Interventions: . Inbound call from the patient who confirms receipt of previously mailed resources . Assessed for the patients interest in touring independent living communities . Determined the patient is recovering from a recent pneumonia infection and has been unable to follow up on resource. The patient reports he has completed a round of antibiotics and is scheduled for a follow up x-ray on June 19th to confirm infections has cleared . Scheduled follow up call to the patient to review materials in the next 10-14 days  Patient Self Care Activities:  . Self administers medications as prescribed . Attends all scheduled provider appointments . Calls pharmacy for medication refills . Calls provider office for new concerns or  questions  Please see past updates related to this goal by clicking on the "Past Updates" button in the selected goal          Follow Up Plan: SW will follow up with patient by phone over the next 10-14 days   [name], BSW, CDP Social Worker, Certified Dementia Practitioner [place] [facility] [place] [facility] [phone]  Total time spent performing care coordination and/or care management activities with the patient by phone or face to face = 15 minutes.

## 2019-02-24 ENCOUNTER — Ambulatory Visit (INDEPENDENT_AMBULATORY_CARE_PROVIDER_SITE_OTHER): Payer: Medicare Other

## 2019-02-24 DIAGNOSIS — I129 Hypertensive chronic kidney disease with stage 1 through stage 4 chronic kidney disease, or unspecified chronic kidney disease: Secondary | ICD-10-CM | POA: Diagnosis not present

## 2019-02-24 DIAGNOSIS — J449 Chronic obstructive pulmonary disease, unspecified: Secondary | ICD-10-CM

## 2019-02-24 DIAGNOSIS — N182 Chronic kidney disease, stage 2 (mild): Secondary | ICD-10-CM | POA: Diagnosis not present

## 2019-02-24 NOTE — Chronic Care Management (AMB) (Signed)
  Chronic Care Management   Outreach Note  02/24/2019 Name: Geoffrey Frank MRN: [id number] DOB: [birth year]  Referred by: Glendale Chard, MD Reason for referral : Chronic Care Management (CCM RNCM Telephone Follow Up )  Update received from embedded BSW [name] stating patient advised her he was recent dx with Pneumonia but is feeling better.  A second unsuccessful telephone outreach was attempted today. The patient was referred to the case management team for assistance with chronic care management and care coordination.   Follow Up Plan: A HIPPA compliant phone message was left for the patient providing contact information and requesting a return call. Next scheduled  CCM RN CM Telephone Outreach set for 03/03/19.      [name], RN, BSN, CCM  Care Management Coordinator [place] [facility]  Direct Phone: [phone]

## 2019-03-02 ENCOUNTER — Ambulatory Visit: Payer: Self-pay

## 2019-03-02 DIAGNOSIS — N182 Chronic kidney disease, stage 2 (mild): Secondary | ICD-10-CM

## 2019-03-02 DIAGNOSIS — J449 Chronic obstructive pulmonary disease, unspecified: Secondary | ICD-10-CM

## 2019-03-02 DIAGNOSIS — I129 Hypertensive chronic kidney disease with stage 1 through stage 4 chronic kidney disease, or unspecified chronic kidney disease: Secondary | ICD-10-CM

## 2019-03-02 NOTE — Chronic Care Management (AMB) (Signed)
Chronic Care Management    Social Work Follow Up Note  03/02/2019 Name: Geoffrey Frank MRN: [id number] DOB: [birth year]  Geoffrey Frank is a 85 y.o. year old male who is a primary care patient of Glendale Chard, MD. The CCM team was consulted for assistance with [employer].   Review of patient status, including review of consultants reports, other relevant assessments, and collaboration with appropriate care team members and the patient's provider was performed as part of comprehensive patient evaluation and provision of chronic care management services.     Outbound call to the patient to review progression of patient stated goals. HIPAA identifiers confirmed. During today's call, the patient reports a recent fall that occurred while walking his dog. The patient reports no injuries at this time. CCM SW has alerted both the patients primary care physician as well as CCM RN Case Manger via in basket message to communicate recent fall.  Goals Addressed            This Visit's Progress     Patient Stated   . COMPLETED: "I want to speak with someone about my depression" (pt-stated)       Current Barriers:  . Social Isolation . Inability to perform IADL's independently  . Embarrassment in physical decline related to aging and medical conditions   Clinical Social Work Clinical Goal(s):  [name] Over the next 30 days, client will follow up with a licensed professional as directed by SW Not met; patient no longer interested in achieving this goal  Interventions: . Outbound call to the patient to assess progression of patient stated goal . Assessed for patients willingness to participate in a therapeutic relationship with a licensed clinician . Determined the patient is no longer interested in achieving this goal stating "I'm feeling better this year than last year" . Encouraged the patient to alert his provider if he is agreeable to services at a later date  Patient Self Care  Activities:  . Self administers medications as prescribed . Attends all scheduled provider appointments . Calls provider office for new concerns or questions  Please see past updates related to this goal by clicking on the "Past Updates" button in the selected goal      . "I'm slowing down tremendously and may need some help in the future" (pt-stated)       Current Barriers:  . Level of care concerns . ADL IADL limitations . Social Isolation . Limited education about long term care options such as independent living and assisted living  Clinical Social Work Clinical Goal(s):  [name] Over the next 30 days, patient will be educated on long term care options and the difference between independent living and assisted living communities. Not met; re-established goal 6/15  Interventions: . Outbound call to the patient to assess progression of patient stated goal . Determined the patient is still interested in placement options and feels he may be more appropriate for assisted living versus independent living . Reviewed average costs with the patient for assisted living facilities - the patient feels most communities will be out of his price range . Assessed for reasoning the patient feels he may be more suited for [place]F versus [place] - the patient reports he is most interested in socialization so he does not feel so lonely. The patient reports he used to be very active with a local senior center until approximately 1.5 years ago due to embarrassment of urinary incontinence . Reviewed barriers to socialization due to COVID 19 .  Educated the patient on opportunities to participate in virtual socialization events offered by the senior resources of [place] . Contacted Alene Mires with Development worker, community of [employer] requesting outreach to engage patient . Informed the patient CCM SW would review placement opportunities within his budget   Patient Self Care Activities:  . Self  administers medications as prescribed . Attends all scheduled provider appointments . Calls pharmacy for medication refills . Calls provider office for new concerns or questions  Please see past updates related to this goal by clicking on the "Past Updates" button in the selected goal          Follow Up Plan: SW will follow up with patient by phone over the next 2-3 weeks   [name], BSW, CDP Social Worker, Certified Dementia Practitioner [place] / [place] [facility] [phone]  Total time spent performing care coordination and/or care management activities with the patient by phone or face to face = 26 minutes.

## 2019-03-02 NOTE — Patient Instructions (Signed)
Social Worker Visit Information  Goals we discussed today:  Goals Addressed            This Visit's Progress     Patient Stated   . COMPLETED: "I want to speak with someone about my depression" (pt-stated)       Current Barriers:  . Social Isolation . Inability to perform IADL's independently  . Embarrassment in physical decline related to aging and medical conditions   Clinical Social Work Clinical Goal(s):  [name] Over the next 30 days, client will follow up with a licensed professional as directed by SW Not met; patient no longer interested in achieving this goal  Interventions: . Outbound call to the patient to assess progression of patient stated goal . Assessed for patients willingness to participate in a therapeutic relationship with a licensed clinician . Determined the patient is no longer interested in achieving this goal stating "I'm feeling better this year than last year" . Encouraged the patient to alert his provider if he is agreeable to services at a later date  Patient Self Care Activities:  . Self administers medications as prescribed . Attends all scheduled provider appointments . Calls provider office for new concerns or questions  Please see past updates related to this goal by clicking on the "Past Updates" button in the selected goal      . "I'm slowing down tremendously and may need some help in the future" (pt-stated)       Current Barriers:  . Level of care concerns . ADL IADL limitations . Social Isolation . Limited education about long term care options such as independent living and assisted living  Clinical Social Work Clinical Goal(s):  [name] Over the next 30 days, patient will be educated on long term care options and the difference between independent living and assisted living communities. Not met; re-established goal 6/15  Interventions: . Outbound call to the patient to assess progression of patient stated goal . Determined the patient is still  interested in placement options and feels he may be more appropriate for assisted living versus independent living . Reviewed average costs with the patient for assisted living facilities - the patient feels most communities will be out of his price range . Assessed for reasoning the patient feels he may be more suited for [place]F versus [place] - the patient reports he is most interested in socialization so he does not feel so lonely. The patient reports he used to be very active with a local senior center until approximately 1.5 years ago due to embarrassment of urinary incontinence . Reviewed barriers to socialization due to [place] the patient on opportunities to participate in virtual socialization events offered by the senior resources of [place] . Contacted Alene Mires with Development worker, community of [employer] requesting outreach to engage patient . Informed the patient CCM SW would review placement opportunities within his budget   Patient Self Care Activities:  . Self administers medications as prescribed . Attends all scheduled provider appointments . Calls pharmacy for medication refills . Calls provider office for new concerns or questions  Please see past updates related to this goal by clicking on the "Past Updates" button in the selected goal          Materials Provided: Verbal education about placement options provided by phone  Follow Up Plan: SW will follow up with patient by phone over the next 2-3 weeks   [name], BSW, CDP Social Worker, Certified Dementia  Practitioner [facility] Care Management [phone]

## 2019-03-03 ENCOUNTER — Telehealth: Payer: Self-pay

## 2019-03-08 NOTE — Telephone Encounter (Signed)
Opened in error

## 2019-03-11 ENCOUNTER — Telehealth: Payer: Self-pay

## 2019-03-16 ENCOUNTER — Other Ambulatory Visit: Payer: Self-pay

## 2019-03-16 ENCOUNTER — Emergency Department (HOSPITAL_COMMUNITY)
Admission: EM | Admit: 2019-03-16 | Discharge: 2019-03-16 | Disposition: A | Discharge status: Home / Self Care | Payer: Medicare Other | Attending: Emergency Medicine | Admitting: Emergency Medicine

## 2019-03-16 ENCOUNTER — Encounter (HOSPITAL_COMMUNITY): Payer: Self-pay | Admitting: Emergency Medicine

## 2019-03-16 DIAGNOSIS — K625 Hemorrhage of anus and rectum: Secondary | ICD-10-CM

## 2019-03-16 DIAGNOSIS — C670 Malignant neoplasm of trigone of bladder: Secondary | ICD-10-CM | POA: Insufficient documentation

## 2019-03-16 DIAGNOSIS — J449 Chronic obstructive pulmonary disease, unspecified: Secondary | ICD-10-CM | POA: Diagnosis not present

## 2019-03-16 DIAGNOSIS — K5900 Constipation, unspecified: Secondary | ICD-10-CM

## 2019-03-16 DIAGNOSIS — I10 Essential (primary) hypertension: Secondary | ICD-10-CM | POA: Insufficient documentation

## 2019-03-16 DIAGNOSIS — Z79899 Other long term (current) drug therapy: Secondary | ICD-10-CM | POA: Diagnosis not present

## 2019-03-16 DIAGNOSIS — E119 Type 2 diabetes mellitus without complications: Secondary | ICD-10-CM | POA: Insufficient documentation

## 2019-03-16 DIAGNOSIS — Z87891 Personal history of nicotine dependence: Secondary | ICD-10-CM | POA: Insufficient documentation

## 2019-03-16 DIAGNOSIS — Z8546 Personal history of malignant neoplasm of prostate: Secondary | ICD-10-CM | POA: Diagnosis not present

## 2019-03-16 DIAGNOSIS — Z7982 Long term (current) use of aspirin: Secondary | ICD-10-CM | POA: Insufficient documentation

## 2019-03-16 LAB — COMPREHENSIVE METABOLIC PANEL
ALT: 29 U/L (ref 0–44)
AST: 28 U/L (ref 15–41)
Albumin: 4 g/dL (ref 3.5–5.0)
Alkaline Phosphatase: 52 U/L (ref 38–126)
Anion gap: 9 (ref 5–15)
BUN: 26 mg/dL — ABNORMAL HIGH (ref 8–23)
CO2: 25 mmol/L (ref 22–32)
Calcium: 9.2 mg/dL (ref 8.9–10.3)
Chloride: 107 mmol/L (ref 98–111)
Creatinine, Ser: 1 mg/dL (ref 0.61–1.24)
GFR calc Af Amer: >60 mL/min (ref >60)
GFR calc non Af Amer: >60 mL/min (ref >60)
Glucose, Bld: 124 mg/dL — ABNORMAL HIGH (ref 70–99)
Potassium: 4.7 mmol/L (ref 3.5–5.1)
Sodium: 141 mmol/L (ref 135–145)
Total Bilirubin: 0.9 mg/dL (ref 0.3–1.2)
Total Protein: 6.5 g/dL (ref 6.5–8.1)

## 2019-03-16 LAB — CBC
HCT: 37.8 % — ABNORMAL LOW (ref 39.0–52.0)
Hemoglobin: 11 g/dL — ABNORMAL LOW (ref 13.0–17.0)
MCH: 22.9 pg — ABNORMAL LOW (ref 26.0–34.0)
MCHC: 29.1 g/dL — ABNORMAL LOW (ref 30.0–36.0)
MCV: 78.8 fL — ABNORMAL LOW (ref 80.0–100.0)
Platelets: 180 10*3/uL (ref 150–400)
RBC: 4.8 MIL/uL (ref 4.22–5.81)
RDW: 15.8 % — ABNORMAL HIGH (ref 11.5–15.5)
WBC: 9.6 10*3/uL (ref 4.0–10.5)
nRBC: 0 % (ref 0.0–0.2)

## 2019-03-16 LAB — TYPE AND SCREEN
ABO/RH(D): B POS
Antibody Screen: NEGATIVE

## 2019-03-16 LAB — POC OCCULT BLOOD, ED: Fecal Occult Bld: POSITIVE — AB

## 2019-03-16 LAB — PROTIME-INR
INR: 1 (ref 0.8–1.2)
Prothrombin Time: 13.2 seconds (ref 11.4–15.2)

## 2019-03-16 LAB — ABO/RH: ABO/RH(D): B POS

## 2019-03-16 MED ORDER — LIDOCAINE HCL URETHRAL/MUCOSAL 2 % EX GEL
1.0000 "application " | Freq: Once | CUTANEOUS | Status: AC
  Administered 2019-03-16: 1 via TOPICAL
  Filled 2019-03-16: qty 5

## 2019-03-16 MED ORDER — LIDOCAINE 5 % EX OINT: 1.0000 "application " | TOPICAL_OINTMENT | CUTANEOUS | 0 refills | Status: DC | PRN

## 2019-03-16 NOTE — Discharge Instructions (Signed)
I recommend that you increase your water and fiber intake. If you are not able to eat foods high in fiber, you may use Benefiber or Metamucil over-the-counter. I also recommend you use MiraLAX 1-2 times a day and Colace 100 mg twice a day to help with bowel movements. These medications are over the counter. If you develop severe abdominal pain, fever (temperature of 100.4 or higher), persistent vomiting, distention of your abdomen, unable to have a bowel movement for 5 days or are not passing gas, please return to the hospital.   It is normal to have rectal bleeding after constipation.  It appears that your rectal bleeding is slowly improving and your blood counts today as well as your vital signs were normal.  I feel you are safe to go home.  We are discharging you with lidocaine that you may use to help with rectal tenderness.  If you develop significant bleeding again, begin passing clots or having black tarry stools, abdominal pain, begin vomiting blood or have a fever, develops shortness of breath or dizziness, please return to the emergency department.
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
## 2019-03-16 NOTE — ED Notes (Signed)
Bed: BM15 Expected date:  Expected time:  Means of arrival:  Comments: Rectal pain

## 2019-03-16 NOTE — ED Triage Notes (Signed)
Pt arriving via [facility] for rectal bleed. Pt was constipated for a few days, took a laxative and began having bright red blood in his stool after laxative took effect. Pt has hx of same.

## 2019-03-16 NOTE — ED Notes (Signed)
Contacted pts granddaughter and informed her that pt is up for discharge.

## 2019-03-16 NOTE — ED Notes (Signed)
Pt provided water.  

## 2019-03-18 ENCOUNTER — Ambulatory Visit: Payer: Self-pay

## 2019-03-18 DIAGNOSIS — J449 Chronic obstructive pulmonary disease, unspecified: Secondary | ICD-10-CM

## 2019-03-18 DIAGNOSIS — I129 Hypertensive chronic kidney disease with stage 1 through stage 4 chronic kidney disease, or unspecified chronic kidney disease: Secondary | ICD-10-CM

## 2019-03-18 DIAGNOSIS — N182 Chronic kidney disease, stage 2 (mild): Secondary | ICD-10-CM

## 2019-03-18 NOTE — Chronic Care Management (AMB) (Signed)
  Chronic Care Management   Follow Up Note   03/18/2019 Name: Geoffrey Frank MRN: [id number] DOB: 08/22/1926  Referred by: Glendale Chard, MD Reason for referral : Chronic Care Management (CCM RNCM Telephone Follow up )   Geoffrey Frank is a 85 y.o. year old male who is a primary care patient of Glendale Chard, MD. The CCM team was consulted for assistance with chronic disease management and care coordination needs.    Review of patient status, including review of consultants reports, relevant laboratory and other test results, and collaboration with appropriate care team members and the patient's provider was performed as part of comprehensive patient evaluation and provision of chronic care management services.    Outbound call placed to Geoffrey Frank to inform him of PCP recommendations for treatment of his constipation.   Goals Addressed      Patient Stated   . "I am having problems with constipation" (pt-stated)       Current Barriers:  [name] Knowledge Deficits related to Self health management for constipation   Nurse Case Manager Clinical Goal(s):  [name] Over the next 30 days, patient will verbalize understanding of plan for Self health management of constipation  . Over the next 60 days, patient will not experience any ED visits and or IP admissions secondary to constipation or fecal impaction  CCM RN CM Interventions:  03/18/19 call completed with patient  . Received message from Dr. Baird Cancer with recommendations for patient to start [place] for his constipation . Placed outbound call to patient informing him of Dr. Baird Cancer recommendation - discussed Geoffrey Frank will pick up samples from Dr. Baird Cancer office tomorrow am -instructed patient to take daily upon awakening and wait 1 hour before eating breakfast or taking other medications - pt verbalizes understanding and is appreciative of Dr. Lynder Parents recommendation  . Discussed plans with patient for ongoing care management follow up and  provided patient with direct contact information for care management team  Patient Self Care Activities:  . Self administers medications as prescribed . Attends all scheduled provider appointments . Calls pharmacy for medication refills . Performs ADL's independently . Performs IADL's independently . Calls provider office for new concerns or questions  Please see past updates related to this goal by clicking on the "Past Updates" button in the selected goal         The care management team will reach out to the patient again over the next 7-10 days.    [name], RN, BSN, CCM Care Management Coordinator [place] [facility]  Direct Phone: [phone]

## 2019-03-18 NOTE — Chronic Care Management (AMB) (Signed)
Chronic Care Management   Initial Visit Note  03/18/2019 Name: Geoffrey Frank MRN: [id number] DOB: Dec 14, 1925  Referred by: Glendale Chard, MD Reason for referral : Chronic Care Management (#3 CCM RNCM Telephone Follow up )   Geoffrey Frank is a 85 y.o. year old male who is a primary care patient of Glendale Chard, MD. The CCM team was consulted for assistance with chronic disease management and care coordination needs.   Review of patient status, including review of consultants reports, relevant laboratory and other test results, and collaboration with appropriate care team members and the patient's provider was performed as part of comprehensive patient evaluation and provision of chronic care management services.    I spoke with Geoffrey Frank by telephone today to f/u on his recent ED visit for constipation and rectal bleeding.   Objective:  Lab Results  Component Value Date   HGBA1C 5.9 (H) 06/25/2018   HGBA1C 5.9 (H) 09/29/2017   HGBA1C 5.9 (H) 10/22/2016   Lab Results  Component Value Date   CREATININE 1.00 03/16/2019   BP Readings from Last 3 Encounters:  03/16/19 (!) 160/86  12/29/18 (!) 142/80  12/29/18 (!) 142/80    Goals Addressed      Patient Stated   . "I am having problems with constipation" (pt-stated)       Current Barriers:  [name] Knowledge Deficits related to Self health management for constipation   Nurse Case Manager Clinical Goal(s):  [name] Over the next 30 days, patient will verbalize understanding of plan for Self health management of constipation  . Over the next 60 days, patient will not experience any ED visits and or IP admissions secondary to constipation or fecal impaction  CCM RN CM Interventions:  03/18/19 call completed with patient  . Discussed patients recent ED visit from 03/16/19 for constipation and rectal bleeding . Assessed for s/s suggestive of fecal impaction - patient denies symptoms . Evaluation of current treatment plan related to  treatment management for constipation and patient's adherence to plan as established by provider . Advised patient to drink plenty of water everyday to stay well hydrated and to add prune juice as a natural laxative  . Provided education to patient re: bowel training to help prevent constipation; instructed patient on best time to attempt to have a BM 20-40 minutes after a meal; instructed patient to spend about 10-15 minutes on the toilet before giving up but not strain . Reviewed medications with patient and discussed prescribed laxatives/stool softners including Colace and Miralax; patient admits he ran out of the Colace and is only using Miralax when he thinks about it but is not taking everyday; discussed patient will refill his Colace and use as directed, he will use the Miralax daily until he regains normal stools; Reviewed patient's post d/c Rx for Lidocaine 5% jelly; instructed patient on how to use this medication per discussion with embedded PharmD Lottie Dawson; patient instructed to apply topically on his rectum and not to internally inject the Lidocaine into his rectum - pt verbalizes understanding    . Collaborated with Dr. Baird Cancer regarding patient's recent ED visit for constipation and rectal bleeding; advised of patient's reported symptoms today and the education and instruction that was provided during today's call  . Discussed plans with patient for ongoing care management follow up and provided patient with direct contact information for care management team   Patient Self Care Activities:  . Self administers medications as prescribed . Attends all scheduled provider appointments .  Calls pharmacy for medication refills . Performs ADL's independently . Performs IADL's independently . Calls provider office for new concerns or questions  Initial goal documentation      Plan:   The care management team will reach out to the patient again over the next 5-7 days.   [name],  RN, BSN, CCM Care Management Coordinator [place] [facility]  Direct Phone: [phone]

## 2019-03-18 NOTE — Patient Instructions (Signed)
Visit Information  Goals Addressed      Patient Stated   . "I am having problems with constipation" (pt-stated)       Current Barriers:  [name] Knowledge Deficits related to Self health management for constipation   Nurse Case Manager Clinical Goal(s):  [name] Over the next 30 days, patient will verbalize understanding of plan for Self health management of constipation  . Over the next 60 days, patient will not experience any ED visits and or IP admissions secondary to constipation or fecal impaction  CCM RN CM Interventions:  03/18/19 call completed with patient  . Received message from Dr. Baird Cancer with recommendations for patient to start [place] for his constipation . Placed outbound call to patient informing him of Dr. Baird Cancer recommendation - discussed Mr. Krass will pick up samples from Dr. Baird Cancer office tomorrow am -instructed patient to take daily upon awakening and wait 1 hour before eating breakfast or taking other medications - pt verbalizes understanding and is appreciative of Dr. Lynder Parents recommendation  . Discussed plans with patient for ongoing care management follow up and provided patient with direct contact information for care management team  Patient Self Care Activities:  . Self administers medications as prescribed . Attends all scheduled provider appointments . Calls pharmacy for medication refills . Performs ADL's independently . Performs IADL's independently . Calls provider office for new concerns or questions  Please see past updates related to this goal by clicking on the "Past Updates" button in the selected goal        The patient verbalized understanding of instructions provided today and declined a print copy of patient instruction materials.   The care management team will reach out to the patient again over the next 7-10 days.   [name], RN, BSN, CCM Care Management Coordinator [place] [facility]  Direct Phone:  [phone]

## 2019-03-19 NOTE — Patient Instructions (Signed)
Social Worker Visit Information  Goals we discussed today:  Goals Addressed            This Visit's Progress     Patient Stated   . "I'm slowing down tremendously and may need some help in the future" (pt-stated)       Current Barriers:  . Level of care concerns . ADL IADL limitations . Social Isolation . Limited education about long term care options such as independent living and assisted living  Clinical Social Work Clinical Goal(s):  [name] Over the next 30 days, patient will be educated on long term care options and the difference between independent living and assisted living communities. Not met; re-established goal 6/15 . 03/18/19- Over the next 30 days patient will work with SW to identify in home caregiver options to assist with ADL's and iADL's.  Interventions: Completed 03/18/19 . Outbound call to the patient to assess progression of patient stated goal . Determined the patient prefers to have a caregiver in the home to assist with care needs . Educated the patient on caregiver options including community programs and private pay options . Advised the patient that community programs have a wait list and the only way to obtain a caregiver quickly would be to privately hire one . Informed by the patient he felt he could afford to hire a caregiver to assist in small increments . Mailed the patient a list of caregiver agencies who services [facility] for his review . Inbound call from the patient to reports he has done the math and hiring a caregiver multiple days in a month would add up quickly . Educated the patient that unfortunately his income is substantially over the eligibility limit to receive assistance from [facility] for in home care . Determined the patient is in fact a veteran but has never received VA benefits due to being happy with his state health plan . Advised the patient that pending service type he may qualify for VA benefits for aide and attendance . Provided  the patient with the contact number to [facility] . Encouraged the patient to contact this agency to inquire if he may/may not qualify for the program  . Scheduled outbound call to the patient in the next 3 weeks to review mailed resource and determine outcome of VA eligibility  Patient Self Care Activities:  . Self administers medications as prescribed . Attends all scheduled provider appointments . Calls pharmacy for medication refills . Calls provider office for new concerns or questions  Please see past updates related to this goal by clicking on the "Past Updates" button in the selected goal          Materials Provided: Yes: Mailed the patient a list of caregiver providers in [place]: SW will follow up with patient by phone over the next 3 weeks  [name], BSW, CDP Social Worker, Certified Dementia Practitioner [place] [facility] [place] [facility] [phone]

## 2019-03-19 NOTE — Chronic Care Management (AMB) (Signed)
Chronic Care Management   Social Work Follow Up Note  03/18/2019 Name: Geoffrey Frank MRN: [id number] DOB: October 03, 1925  Geoffrey Frank is a 85 y.o. year old male who is a primary care patient of Glendale Chard, MD. The CCM team was consulted for assistance with [employer].   Review of patient status, including review of consultants reports, other relevant assessments, and collaboration with appropriate care team members and the patient's provider was performed as part of comprehensive patient evaluation and provision of chronic care management services.    Outbound call to the patient to assess progression of below patient stated SW goal. During the call the patient discussed concern with continued constipation and stated "If I haven't had a movement by tomorrow I will go back to the hospital". CCM SW collaborated with CCM RN Case Electronics engineer Little to assist with patient clinical needs. See RN Case Manager encounter for further details.   Goals Addressed            This Visit's Progress     Patient Stated    "I'm slowing down tremendously and may need some help in the future" (pt-stated)       Current Barriers:   Level of care concerns  ADL IADL limitations  Social Isolation  Limited education about long term care options such as independent living and assisted living  Clinical Social Work Clinical Goal(s):   Over the next 30 days, patient will be educated on long term care options and the difference between independent living and assisted living communities. Not met; re-established goal 6/15  03/18/19- Over the next 30 days patient will work with SW to identify in home caregiver options to assist with ADL's and iADL's.  Interventions: Completed 03/18/19  Outbound call to the patient to assess progression of patient stated goal  Determined the patient prefers to have a caregiver in the home to assist with care needs  Educated the patient on caregiver options  including community programs and private pay options  Advised the patient that community programs have a wait list and the only way to obtain a caregiver quickly would be to privately hire one  Informed by the patient he felt he could afford to hire a caregiver to assist in small increments  Mailed the patient a list of caregiver agencies who services [facility] for his review  Inbound call from the patient to reports he has done the math and hiring a caregiver multiple days in a month would add up quickly  Educated the patient that unfortunately his income is substantially over the eligibility limit to receive assistance from [facility] for in home care  Determined the patient is in fact a veteran but has never received VA benefits due to being happy with his state health plan  Advised the patient that pending service type he may qualify for VA benefits for aide and attendance  Provided the patient with the contact number to [facility]  Encouraged the patient to contact this agency to inquire if he may/may not qualify for the program   Scheduled outbound call to the patient in the next 3 weeks to review mailed resource and determine outcome of VA eligibility  Patient Self Care Activities:   Self administers medications as prescribed  Attends all scheduled provider appointments  Calls pharmacy for medication refills  Calls provider office for new concerns or questions  Please see past updates related to this goal by clicking on the "Past Updates" button  in the selected goal          Follow Up Plan: SW will follow up with patient by phone over the next 3 weeks   [name], BSW, CDP Social Worker, Certified Dementia Practitioner [facility] [phone]  Total time spent performing care coordination and/or care management activities with the patient by phone or face to face = 50 minutes.

## 2019-03-22 ENCOUNTER — Encounter: Payer: Self-pay | Admitting: Nurse Practitioner

## 2019-03-22 ENCOUNTER — Other Ambulatory Visit: Payer: Self-pay

## 2019-03-22 ENCOUNTER — Ambulatory Visit
Admission: RE | Admit: 2019-03-22 | Discharge: 2019-03-22 | Disposition: A | Discharge status: Home / Self Care | Payer: Medicare Other | Source: Ambulatory Visit | Attending: Nurse Practitioner | Admitting: Nurse Practitioner

## 2019-03-22 ENCOUNTER — Ambulatory Visit: Payer: Medicare Other | Admitting: Nurse Practitioner

## 2019-03-22 VITALS — BP 122/76 | HR 94 | Temp 98.2°F | Ht 65.6 in | Wt 173.6 lb

## 2019-03-22 DIAGNOSIS — K5900 Constipation, unspecified: Secondary | ICD-10-CM

## 2019-03-22 DIAGNOSIS — Z09 Encounter for follow-up examination after completed treatment for conditions other than malignant neoplasm: Secondary | ICD-10-CM

## 2019-03-22 DIAGNOSIS — R21 Rash and other nonspecific skin eruption: Secondary | ICD-10-CM

## 2019-03-22 MED ORDER — NYSTATIN 100000 UNIT/GM EX CREA: 1.0000 "application " | TOPICAL_CREAM | Freq: Two times a day (BID) | CUTANEOUS | 2 refills | Status: DC
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
## 2019-03-25 ENCOUNTER — Telehealth: Payer: Self-pay

## 2019-03-25 NOTE — Telephone Encounter (Signed)
The pt called and left a message that he was still constipated and that the linzess he has been taking isn't doing anything and he wanted to know if he should go to the ER.  I left a detailed message on his cell phone that Laurance Flatten, NP wanted to know if he has been taking the linzess 2 times per day as instructed at his visit and to let him know that his stomach was soft to the touch and for him to call the office back with to let Laurance Flatten, NP know.

## 2019-03-25 NOTE — Telephone Encounter (Signed)
The pt called and left a message that he was still constipated and that the linzess he has been taking isn't doing anything and he wanted to know if he should go to the ER.  I left a detailed message on his cell phone that Laurance Flatten, NP wanted to know if he has been taking the linzess 2 times per day as instructed at his visit and to let him know that his stomach was soft to the touch and for him to call the office back with to let Laurance Flatten, NP know.  THE PT SAID THAT HE HAS BEEN TAKING THE LINZESS 2 TIMES PER DAY.

## 2019-03-25 NOTE — Telephone Encounter (Signed)
The pt was told that Laurance Flatten, NP said for the pt to take the Linzess 2 times per day and that he was supposed to also be taking the miralax.  The pt said he didn't now about the Miralax and that he will try that also.  The pt asked about his recent abdominal ultrasound results and he was given the results.

## 2019-03-26 ENCOUNTER — Other Ambulatory Visit: Payer: Self-pay

## 2019-03-26 ENCOUNTER — Ambulatory Visit: Payer: Self-pay

## 2019-03-26 ENCOUNTER — Telehealth: Payer: Self-pay

## 2019-03-26 DIAGNOSIS — I129 Hypertensive chronic kidney disease with stage 1 through stage 4 chronic kidney disease, or unspecified chronic kidney disease: Secondary | ICD-10-CM

## 2019-03-26 DIAGNOSIS — J449 Chronic obstructive pulmonary disease, unspecified: Secondary | ICD-10-CM

## 2019-03-26 DIAGNOSIS — N182 Chronic kidney disease, stage 2 (mild): Secondary | ICD-10-CM

## 2019-03-26 NOTE — Chronic Care Management (AMB) (Signed)
  Chronic Care Management   Follow Up Note   03/26/2019 Name: Geoffrey Frank MRN: [id number] DOB: 1925/09/18  Referred by: Glendale Chard, MD Reason for referral : Chronic Care Management (CCM RNCM Telephone Follow up )   Geoffrey Frank is a 85 y.o. year old male who is a primary care patient of Glendale Chard, MD. The CCM team was consulted for assistance with chronic disease management and care coordination needs.    Review of patient status, including review of consultants reports, relevant laboratory and other test results, and collaboration with appropriate care team members and the patient's provider was performed as part of comprehensive patient evaluation and provision of chronic care management services.    I spoke with Geoffrey Frank by telephone today to f/u on his constipation.    Goals Addressed      Patient Stated   . "I am having problems with constipation" (pt-stated)       Current Barriers:  [name] Knowledge Deficits related to Self health management for constipation   Nurse Case Manager Clinical Goal(s):  [name] Over the next 30 days, patient will verbalize understanding of plan for Self health management of constipation  . Over the next 60 days, patient will not experience any ED visits and or IP admissions secondary to constipation or fecal impaction  CCM RN CM Interventions:  03/26/19 call completed with patient  . Placed outbound call to patient; assessed for relief from constipation - patient has not passed a normal stool since our last conversation - pt reports last BM was on 03/16/19 - patient states he may revisit the ED if no results by tomorrow . Assessed for patient's adherence to the prescribed plan of care and MD recommendations for the constipation - patient states he is drinking Miralax, prune juice and taking Linzess twice daily as directed . Encouraged patient to increase his activity such as walking and to drink plenty of water - discussed having patient walk  in his [place] at his apartment complex and or to wait until dawn/dusk once the temperatures have cooled down to walk outdoors . Stressed the importance of balancing his activity with rest and not to get overheated while trying to increase his physical activity  . Discussed the importance of eating foods that are high in fiber and to limit milk, meat, processed foods and cheese . Printed patient ed mats related to constipation with aging, and what foods to eat; to be mailed to patient's home . Discussed plans with patient for ongoing care management follow up and provided patient with direct contact information for care management team  Patient Self Care Activities:  . Self administers medications as prescribed . Attends all scheduled provider appointments . Calls pharmacy for medication refills . Performs ADL's independently . Performs IADL's independently . Calls provider office for new concerns or questions  Please see past updates related to this goal by clicking on the "Past Updates" button in the selected goal         The care management team will reach out to the patient again over the next 7-14 days.    [name], RN, BSN, CCM Care Management Coordinator [facility]  Direct Phone: [phone]

## 2019-03-27 ENCOUNTER — Other Ambulatory Visit: Payer: Self-pay

## 2019-03-27 ENCOUNTER — Emergency Department (HOSPITAL_COMMUNITY)
Admission: EM | Admit: 2019-03-27 | Discharge: 2019-03-27 | Disposition: A | Discharge status: Home / Self Care | Payer: Medicare Other | Attending: Emergency Medicine | Admitting: Emergency Medicine

## 2019-03-27 ENCOUNTER — Encounter (HOSPITAL_COMMUNITY): Payer: Self-pay | Admitting: Emergency Medicine

## 2019-03-27 DIAGNOSIS — I10 Essential (primary) hypertension: Secondary | ICD-10-CM | POA: Diagnosis not present

## 2019-03-27 DIAGNOSIS — K5900 Constipation, unspecified: Secondary | ICD-10-CM | POA: Insufficient documentation

## 2019-03-27 DIAGNOSIS — Z79899 Other long term (current) drug therapy: Secondary | ICD-10-CM | POA: Diagnosis not present

## 2019-03-27 DIAGNOSIS — J449 Chronic obstructive pulmonary disease, unspecified: Secondary | ICD-10-CM | POA: Insufficient documentation

## 2019-03-27 DIAGNOSIS — C61 Malignant neoplasm of prostate: Secondary | ICD-10-CM | POA: Diagnosis not present

## 2019-03-27 DIAGNOSIS — E119 Type 2 diabetes mellitus without complications: Secondary | ICD-10-CM | POA: Insufficient documentation

## 2019-03-27 DIAGNOSIS — Z96643 Presence of artificial hip joint, bilateral: Secondary | ICD-10-CM | POA: Diagnosis not present

## 2019-03-27 DIAGNOSIS — Z87891 Personal history of nicotine dependence: Secondary | ICD-10-CM | POA: Insufficient documentation

## 2019-03-27 DIAGNOSIS — Z7982 Long term (current) use of aspirin: Secondary | ICD-10-CM | POA: Diagnosis not present
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
## 2019-03-27 NOTE — ED Notes (Addendum)
He continues to sit on the commode and is slowly passing some solid b.m. he is comfortable in appearance. He has full access to his call light.

## 2019-03-27 NOTE — ED Notes (Signed)
Soap suds enema given and has been dwelling ~10 min. Now.

## 2019-03-27 NOTE — ED Notes (Signed)
The first soap suds enema elicited no results, and pt. Feels no urge to have a b.m. Second soap suds enema given at this time utilizing a retention device.

## 2019-03-27 NOTE — ED Notes (Signed)
Good results were produced from 2nd enema. Pt. Tells me he feels "much better". He capably dresses himself. He will notify us when his granddaughter arrives to take him home.

## 2019-03-27 NOTE — ED Triage Notes (Signed)
Pt c/o constipation. Pt states he was seen for constipation on 03/16/2019, d/c with meds, pt states he has not had a bowel movement since 03/16/2019. Denies cramping, denies abdominal pain.

## 2019-03-27 NOTE — ED Notes (Signed)
He ambulates to our reception area without difficulty using his wheeled walker.

## 2019-03-29 ENCOUNTER — Telehealth: Payer: Self-pay

## 2019-03-29 NOTE — Telephone Encounter (Signed)
Called to check on pt after hospital visit. Pt states that he is better and is currently having stools regularly. Pt stated that he doesn't drink enough water. He is still taking linzess. Encouraged to call the office if he needs anything.

## 2019-04-02 ENCOUNTER — Telehealth: Payer: Self-pay

## 2019-04-02 NOTE — Telephone Encounter (Signed)
-----   Message from Glendale Chard, MD sent at 04/02/2019 12:37 PM EDT ----- Which dose does he have? I told someone to give him 145mg  dose. I think it was [place]. I am not sure if he ever got them. Please contact him to get higher dose today. If he has this dose, I will increase to 290mg .   RS ----- Message ----- From: Michelle Nasuti, CMA Sent: 04/02/2019  11:45 AM EDT To: Glendale Chard, MD  The pt said that he hasn't had a bowel movement since he went to the ER on  last Saturday and they gave him an enema and he wants to know if it is something else he can be prescribed because the linzess isn't helping.

## 2019-04-02 NOTE — Telephone Encounter (Signed)
The pt said that he has been taking the Linzess 72 mcg.  The pt was told to come and pick up the samples of Linzess 145 mcg to see if that will help with his constipation.

## 2019-04-02 NOTE — Telephone Encounter (Signed)
I spoke with the pt's granddaughter [name] and notified her that Dr. Baird Cancer didn't return to the office until 07/22 and that it typically takes 7 - 10 business days for the [facility] form to be completed.  Ms. Zigmund Daniel said that it's a fax number on the form for it to be faxed to once its completed.  [phone]

## 2019-04-04 ENCOUNTER — Encounter (HOSPITAL_COMMUNITY): Payer: Self-pay

## 2019-04-04 ENCOUNTER — Emergency Department (HOSPITAL_COMMUNITY)
Admission: EM | Admit: 2019-04-04 | Discharge: 2019-04-04 | Disposition: A | Discharge status: Home / Self Care | Payer: Medicare Other | Attending: Emergency Medicine | Admitting: Emergency Medicine

## 2019-04-04 ENCOUNTER — Other Ambulatory Visit: Payer: Self-pay

## 2019-04-04 DIAGNOSIS — Z87891 Personal history of nicotine dependence: Secondary | ICD-10-CM | POA: Insufficient documentation

## 2019-04-04 DIAGNOSIS — E119 Type 2 diabetes mellitus without complications: Secondary | ICD-10-CM | POA: Insufficient documentation

## 2019-04-04 DIAGNOSIS — Z8546 Personal history of malignant neoplasm of prostate: Secondary | ICD-10-CM | POA: Diagnosis not present

## 2019-04-04 DIAGNOSIS — J449 Chronic obstructive pulmonary disease, unspecified: Secondary | ICD-10-CM | POA: Diagnosis not present

## 2019-04-04 DIAGNOSIS — I10 Essential (primary) hypertension: Secondary | ICD-10-CM | POA: Diagnosis not present

## 2019-04-04 DIAGNOSIS — Z79899 Other long term (current) drug therapy: Secondary | ICD-10-CM | POA: Insufficient documentation

## 2019-04-04 DIAGNOSIS — Z7982 Long term (current) use of aspirin: Secondary | ICD-10-CM | POA: Insufficient documentation

## 2019-04-04 DIAGNOSIS — Z8551 Personal history of malignant neoplasm of bladder: Secondary | ICD-10-CM | POA: Diagnosis not present

## 2019-04-04 DIAGNOSIS — K5900 Constipation, unspecified: Secondary | ICD-10-CM | POA: Diagnosis not present

## 2019-04-04 MED ORDER — POLYETHYLENE GLYCOL 3350 17 G PO PACK
34.0000 g | PACK | Freq: Once | ORAL | Status: AC
  Administered 2019-04-04: 18:00:00 34 g via ORAL
  Filled 2019-04-04: qty 2

## 2019-04-04 MED ORDER — SODIUM CHLORIDE 0.9% FLUSH: 3.0000 mL | Freq: Once | INTRAVENOUS | Status: DC

## 2019-04-04 NOTE — Discharge Instructions (Signed)
Get a two 32 oz bottles of a sports drink like gatorade and mix 4 doses of miralax into each one. Consume one bottle over 2-3 hours. If you do not have a bowel movement then drink the second one.

## 2019-04-04 NOTE — ED Triage Notes (Signed)
Patient c/o constipation. Patient states he was seen in the ED last weekend for same and has not had a BM since. Patient states he saw his PCP this past week. Patient was prescribed Lizess,but no relief.

## 2019-04-07 ENCOUNTER — Telehealth: Payer: Self-pay

## 2019-04-07 ENCOUNTER — Ambulatory Visit: Payer: Self-pay

## 2019-04-07 ENCOUNTER — Other Ambulatory Visit: Payer: Self-pay | Admitting: Internal Medicine

## 2019-04-07 DIAGNOSIS — J449 Chronic obstructive pulmonary disease, unspecified: Secondary | ICD-10-CM

## 2019-04-07 DIAGNOSIS — K5641 Fecal impaction: Secondary | ICD-10-CM

## 2019-04-07 DIAGNOSIS — K5900 Constipation, unspecified: Secondary | ICD-10-CM

## 2019-04-07 DIAGNOSIS — I129 Hypertensive chronic kidney disease with stage 1 through stage 4 chronic kidney disease, or unspecified chronic kidney disease: Secondary | ICD-10-CM

## 2019-04-07 DIAGNOSIS — N182 Chronic kidney disease, stage 2 (mild): Secondary | ICD-10-CM

## 2019-04-07 NOTE — Telephone Encounter (Signed)
The pt's grand daughter  Dequise notified that her [employer] forms has been completed and faxed.

## 2019-04-08 ENCOUNTER — Ambulatory Visit: Payer: Self-pay

## 2019-04-08 DIAGNOSIS — N182 Chronic kidney disease, stage 2 (mild): Secondary | ICD-10-CM

## 2019-04-08 DIAGNOSIS — J449 Chronic obstructive pulmonary disease, unspecified: Secondary | ICD-10-CM

## 2019-04-08 DIAGNOSIS — I129 Hypertensive chronic kidney disease with stage 1 through stage 4 chronic kidney disease, or unspecified chronic kidney disease: Secondary | ICD-10-CM

## 2019-04-08 NOTE — Chronic Care Management (AMB) (Signed)
Chronic Care Management   Social Work Follow Up Note  04/07/2019 Name: Geoffrey Frank MRN: [id number] DOB: Jul 06, 1926  Geoffrey INDELICATO is a 85 y.o. year old male who is a primary care patient of Glendale Chard, MD. The CCM team was consulted for assistance with [employer] .   Review of patient status, including review of consultants reports, other relevant assessments, and collaboration with appropriate care team members and the patient's provider was performed as part of comprehensive patient evaluation and provision of chronic care management services.     Goals Addressed            This Visit's Progress     Patient Stated   . "I am having problems with constipation" (pt-stated)   Not on track    Current Barriers:  [name] Knowledge Deficits related to Self health management for constipation   Nurse Case Manager Clinical Goal(s):  [name] Over the next 30 days, patient will verbalize understanding of plan for Self health management of constipation  . Over the next 60 days, patient will not experience any ED visits and or IP admissions secondary to constipation or fecal impaction  CCM SW Interventions Completed 04/07/19 . Outbound call to the patient to assess progression of patient stated SW goals . Informed by the patient he has been to the hospital x2 since last call due to constipation . Reviewed discharge instructions with the patient  . Assessed for patients adherence to medication "I mix a bottle of Miralax with 64 ounces of water and drink it" . Inquired if the patient is to do this daily or only when he has not had a BM. Patient reported "I have already done it. It takes a while but it works. I have never heard of it" . Performed chart review regarding patient recent increase in medication Linzess . Assessed for patients adherence to increased dose of Linzess- patient stated "I'm not taking it, I am going to just take the tonic" . Advised the patient CCM SW would communicate  recent ED visits and current medication regimen to his care team  . Collaboration with Dr. Baird Cancer as well as CCM team via secure chat to communicate concern with medication adherence . Advised by Dr. Baird Cancer she would place home health orders to assist with medication management  Patient Self Care Activities:  . Self administers medications as prescribed . Attends all scheduled provider appointments . Calls pharmacy for medication refills . Performs ADL's independently . Performs IADL's independently . Calls provider office for new concerns or questions  Please see past updates related to this goal by clicking on the "Past Updates" button in the selected goal      . "I'm slowing down tremendously and may need some help in the future" (pt-stated)   Not on track    Current Barriers:  . Level of care concerns . ADL IADL limitations . Social Isolation . Limited education about long term care options such as independent living and assisted living  Clinical Social Work Clinical Goal(s):  [name] Over the next 30 days, patient will be educated on long term care options and the difference between independent living and assisted living communities. Not met; re-established goal 6/15 . 03/18/19- Over the next 30 days patient will work with SW to identify in home caregiver options to assist with ADL's and iADL's.  Interventions: Completed 04/07/19 . Outbound call to the patient to assess progression of patient stated goal . Determined the patient has yet to follow  up on plan due to recent ED visits . Encouraged the patient to follow up with [employer] this week to discuss benefits the patient is eligible for . Scheduled follow up call within the next 3 weeks  Patient Self Care Activities:  . Self administers medications as prescribed . Attends all scheduled provider appointments . Calls pharmacy for medication refills . Calls provider office for new concerns or questions  Please see past  updates related to this goal by clicking on the "Past Updates" button in the selected goal          Follow Up Plan: SW will follow up with patient by phone over the next two weeks.   [name], BSW, CDP Social Worker, Certified Dementia Practitioner [place] [facility] [phone]  Total time spent performing care coordination and/or care management activities with the patient by phone or face to face = 15 minutes.

## 2019-04-08 NOTE — Chronic Care Management (AMB) (Signed)
Chronic Care Management   Follow Up Note   04/08/2019 Name: Geoffrey Frank MRN: [id number] DOB: October 26, 1925  Referred by: Geoffrey Chard, MD Reason for referral : Chronic Care Management (CCM RNCM Telephone Follow up )   Geoffrey Frank is a 85 y.o. year old male who is a primary care patient of Geoffrey Chard, MD. The CCM team was consulted for assistance with chronic disease management and care coordination needs.    Review of patient status, including review of consultants reports, relevant laboratory and other test results, and collaboration with appropriate care team members and the patient's provider was performed as part of comprehensive patient evaluation and provision of chronic care management services.    I spoke with Geoffrey Frank by telephone today to f/u on his impaired bowel elimination and recent ED visit. Patient seemed hurried during the call and kept the call brief.   Goals Addressed      Patient Stated   . "I am having problems with constipation" (pt-stated)       Current Barriers:  [name] Knowledge Deficits related to Self health management for constipation   Nurse Case Manager Clinical Goal(s):  [name] Over the next 30 days, patient will verbalize understanding of plan for Self health management of constipation  . Over the next 60 days, patient will not experience any ED visits and or IP admissions secondary to constipation or fecal impaction - Not Met . 04/08/19: Over the next 14 days patient will have [facility] services in place for medication management and PT eval  CCM RN CM Interventions:  04/08/19 call completed with patient  . Placed outbound call to patient; assessed for relief from constipation - patient states this condition has improved for him since his last ED visit  . Assessed for patient's adherence to the plan of care and MD recommendations for the constipation - patient states "I am taking the meds I was given from the hospital and what Geoffrey Frank prescribed me"   . Discussed the printed materials sent to patient regarding constipation and aging - he confirms receiving this information and states, "I am doing most of these things" . Informed patient Geoffrey Frank ordered [facility] for nursing and PT - advised the nurse will review his mediations with him to ensure he has a good understanding about how to take his meds and patient verbalizes understanding . Explained the [facility] will contact him to schedule an intake visit - patient verbalizes understanding and seems hurried to end the call  . Discussed plans with patient for ongoing care management follow up and provided patient with direct contact information for care management team  04/08/19 Placed outbound call to [facility] [phone]  . Spoke with Geoffrey Frank who states the [facility] referral has not been received as of yet . Plan to follow up again on Monday to confirm they have received the referral  CCM SW Interventions Completed 04/07/19 . Outbound call to the patient to assess progression of patient stated SW goals . Informed by the patient he has been to the hospital x2 since last call due to constipation . Reviewed discharge instructions with the patient  . Assessed for patients adherence to medication "I mix a bottle of Miralax with 64 ounces of water and drink it" . Inquired if the patient is to do this daily or only when he has not had a BM. Patient reported "I have already done it. It takes a while but it works. I have never heard of it" .  Performed chart review regarding patient recent increase in medication Linzess . Assessed for patients adherence to increased dose of Linzess- patient stated "I'm not taking it, I am going to just take the tonic" . Advised the patient CCM SW would communicate recent ED visits and current medication regimen to his care team  . Collaboration with Geoffrey Frank as well as CCM team via secure chat to communicate concern with medication adherence . Advised by Geoffrey Frank she would  place home health orders to assist with medication management  Patient Self Care Activities:  . Self administers medications as prescribed . Attends all scheduled provider appointments . Calls pharmacy for medication refills . Performs ADL's independently . Performs IADL's independently . Calls provider office for new concerns or questions  Please see past updates related to this goal by clicking on the "Past Updates" button in the selected goal         Telephone follow up appointment with care management team member scheduled for: 04/29/19 Follow up with provider re: receipt of [place] referral    Geoffrey Merino, RN, BSN, CCM Care Management Coordinator [facility] [facility]  Direct Phone: [phone]
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
## 2019-04-08 NOTE — Patient Instructions (Signed)
Social Worker Visit Information  Goals we discussed today:  Goals Addressed            This Visit's Progress     Patient Stated   . "I am having problems with constipation" (pt-stated)   Not on track    Current Barriers:  [name] Knowledge Deficits related to Self health management for constipation   Nurse Case Manager Clinical Goal(s):  [name] Over the next 30 days, patient will verbalize understanding of plan for Self health management of constipation  . Over the next 60 days, patient will not experience any ED visits and or IP admissions secondary to constipation or fecal impaction  CCM SW Interventions Completed 04/07/19 . Outbound call to the patient to assess progression of patient stated SW goals . Informed by the patient he has been to the hospital x2 since last call due to constipation . Reviewed discharge instructions with the patient  . Assessed for patients adherence to medication "I mix a bottle of Miralax with 64 ounces of water and drink it" . Inquired if the patient is to do this daily or only when he has not had a BM. Patient reported "I have already done it. It takes a while but it works. I have never heard of it" . Performed chart review regarding patient recent increase in medication Linzess . Assessed for patients adherence to increased dose of Linzess- patient stated "I'm not taking it, I am going to just take the tonic" . Advised the patient CCM SW would communicate recent ED visits and current medication regimen to his care team  . Collaboration with Dr. Baird Cancer as well as CCM team via secure chat to communicate concern with medication adherence . Advised by Dr. Baird Cancer she would place home health orders to assist with medication management  Patient Self Care Activities:  . Self administers medications as prescribed . Attends all scheduled provider appointments . Calls pharmacy for medication refills . Performs ADL's independently . Performs IADL's  independently . Calls provider office for new concerns or questions  Please see past updates related to this goal by clicking on the "Past Updates" button in the selected goal       . "I'm slowing down tremendously and may need some help in the future" (pt-stated)   Not on track    Current Barriers:  . Level of care concerns . ADL IADL limitations . Social Isolation . Limited education about long term care options such as independent living and assisted living  Clinical Social Work Clinical Goal(s):  [name] Over the next 30 days, patient will be educated on long term care options and the difference between independent living and assisted living communities. Not met; re-established goal 6/15 . 03/18/19- Over the next 30 days patient will work with SW to identify in home caregiver options to assist with ADL's and iADL's.  Interventions: Completed 04/07/19 . Outbound call to the patient to assess progression of patient stated goal . Determined the patient has yet to follow up on plan due to recent ED visits . Encouraged the patient to follow up with [facility] this week to discuss benefits the patient is eligible for . Scheduled follow up call within the next 3 weeks  Patient Self Care Activities:  . Self administers medications as prescribed . Attends all scheduled provider appointments . Calls pharmacy for medication refills . Calls provider office for new concerns or questions  Please see past updates related to this goal by clicking on the "Past  Updates" button in the selected goal          Follow Up Plan: SW will follow up with patient by phone over the next two weeks.  [name], BSW, CDP Social Worker, Certified Dementia Practitioner [place] [facility] [place] [facility] [phone]

## 2019-04-08 NOTE — Patient Instructions (Signed)
Visit Information  Goals Addressed      Patient Stated   . "I am having problems with constipation" (pt-stated)       Current Barriers:  [name] Knowledge Deficits related to Self health management for constipation   Nurse Case Manager Clinical Goal(s):  [name] Over the next 30 days, patient will verbalize understanding of plan for Self health management of constipation  . Over the next 60 days, patient will not experience any ED visits and or IP admissions secondary to constipation or fecal impaction - Not Met . 04/08/19: Over the next 14 days patient will have [facility] services in place for medication management and PT eval  CCM RN CM Interventions:  04/08/19 call completed with patient  . Placed outbound call to patient; assessed for relief from constipation - patient states this condition has improved for him since his last ED visit  . Assessed for patient's adherence to the plan of care and MD recommendations for the constipation - patient states "I am taking the meds I was given from the hospital and what Dr. Baird Cancer prescribed me" patient declined to review meds at this time . Discussed the printed materials sent to patient regarding constipation and aging - he confirms receiving this information and states, "I am doing most of these things" . Informed patient Dr. Baird Cancer ordered [facility] for nursing and PT - advised the nurse will review his mediations with him to ensure he has a good understanding about how to take his meds and patient verbalizes understanding . Explained the [facility] will contact him to schedule an intake visit - patient verbalizes understanding and seems hurried to end the call  . Discussed plans with patient for ongoing care management follow up and provided patient with direct contact information for care management team  04/08/19 Placed outbound call to [facility] [phone]  . Spoke with Judeen Hammans who states the [facility] referral has not been received as of yet . Plan to follow up again on  Monday to confirm they have received the referral  CCM SW Interventions Completed 04/07/19 . Outbound call to the patient to assess progression of patient stated SW goals . Informed by the patient he has been to the hospital x2 since last call due to constipation . Reviewed discharge instructions with the patient  . Assessed for patients adherence to medication "I mix a bottle of Miralax with 64 ounces of water and drink it" . Inquired if the patient is to do this daily or only when he has not had a BM. Patient reported "I have already done it. It takes a while but it works. I have never heard of it" . Performed chart review regarding patient recent increase in medication Linzess . Assessed for patients adherence to increased dose of Linzess- patient stated "I'm not taking it, I am going to just take the tonic" . Advised the patient CCM SW would communicate recent ED visits and current medication regimen to his care team  . Collaboration with Dr. Baird Cancer as well as CCM team via secure chat to communicate concern with medication adherence . Advised by Dr. Baird Cancer she would place home health orders to assist with medication management  Patient Self Care Activities:  . Self administers medications as prescribed . Attends all scheduled provider appointments . Calls pharmacy for medication refills . Performs ADL's independently . Performs IADL's independently . Calls provider office for new concerns or questions  Please see past updates related to this goal by clicking on the "Past  Updates" button in the selected goal        The patient verbalized understanding of instructions provided today and declined a print copy of patient instruction materials.   Telephone follow up appointment with care management team member scheduled for: 04/29/19  [name], RN, BSN, CCM Care Management Coordinator [place] [facility]  Direct Phone: [phone]

## 2019-04-09 ENCOUNTER — Telehealth: Payer: Self-pay

## 2019-04-12 ENCOUNTER — Telehealth: Payer: Self-pay

## 2019-04-12 DIAGNOSIS — J449 Chronic obstructive pulmonary disease, unspecified: Secondary | ICD-10-CM

## 2019-04-12 DIAGNOSIS — K5900 Constipation, unspecified: Secondary | ICD-10-CM | POA: Diagnosis not present

## 2019-04-12 DIAGNOSIS — F339 Major depressive disorder, recurrent, unspecified: Secondary | ICD-10-CM

## 2019-04-12 DIAGNOSIS — Z87891 Personal history of nicotine dependence: Secondary | ICD-10-CM

## 2019-04-12 DIAGNOSIS — R2689 Other abnormalities of gait and mobility: Secondary | ICD-10-CM

## 2019-04-12 DIAGNOSIS — I872 Venous insufficiency (chronic) (peripheral): Secondary | ICD-10-CM | POA: Diagnosis not present

## 2019-04-12 DIAGNOSIS — E1151 Type 2 diabetes mellitus with diabetic peripheral angiopathy without gangrene: Secondary | ICD-10-CM | POA: Diagnosis not present

## 2019-04-20 ENCOUNTER — Ambulatory Visit: Payer: Self-pay

## 2019-04-20 DIAGNOSIS — J449 Chronic obstructive pulmonary disease, unspecified: Secondary | ICD-10-CM

## 2019-04-20 DIAGNOSIS — I129 Hypertensive chronic kidney disease with stage 1 through stage 4 chronic kidney disease, or unspecified chronic kidney disease: Secondary | ICD-10-CM

## 2019-04-20 DIAGNOSIS — N182 Chronic kidney disease, stage 2 (mild): Secondary | ICD-10-CM

## 2019-04-20 NOTE — Chronic Care Management (AMB) (Signed)
  Chronic Care Management   Social Work Follow Up Note  04/20/2019 Name: Geoffrey Frank MRN: [id number] DOB: Mar 14, 1926  Geoffrey Frank is a 85 y.o. year old male who is a primary care patient of Glendale Chard, MD. The CCM team was consulted for assistance with Level of Care Concerns.   Review of patient status, including review of consultants reports, other relevant assessments, and collaboration with appropriate care team members and the patient's provider was performed as part of comprehensive patient evaluation and provision of chronic care management services.     Goals Addressed            This Visit's Progress     Patient Stated   . "I'm slowing down tremendously and may need some help in the future" (pt-stated)   Not on track    Current Barriers:  . Level of care concerns . ADL IADL limitations . Social Isolation . Limited education about long term care options such as independent living and assisted living  Clinical Social Work Clinical Goal(s):  [name] Over the next 30 days, patient will be educated on long term care options and the difference between independent living and assisted living communities. Not met; re-established goal 6/15 . 03/18/19- Over the next 30 days patient will work with SW to identify in home caregiver options to assist with ADL's and iADL's.  CCM SW Interventions: Completed 04/20/19 . Outbound call to the patient to assess progression of patient stated goal . Determined the patient has yet to follow up with a VA Benefit Specialist . Reviewed VA benefits including aide and attendance and assistance with the cost of placement . Provided the patient with the contact number to [facility] [phone]) . Encouraged the patient to contact this program to request assistance with benefit navigation . Scheduled outbound call to the patient over the next two weeks to assess progression of patient goal  Patient Self Care Activities:  .  Self administers medications as prescribed . Attends all scheduled provider appointments . Calls pharmacy for medication refills . Calls provider office for new concerns or questions  Please see past updates related to this goal by clicking on the "Past Updates" button in the selected goal          Follow Up Plan: SW will follow up with patient by phone over the next two weeks.  [name], BSW, CDP Social Worker, Certified Dementia Practitioner [place] [facility] [place] [facility] [phone]  Total time spent performing care coordination and/or care management activities with the patient by phone or face to face = 16 minutes.

## 2019-04-29 ENCOUNTER — Ambulatory Visit (INDEPENDENT_AMBULATORY_CARE_PROVIDER_SITE_OTHER): Payer: Medicare Other

## 2019-04-29 ENCOUNTER — Telehealth: Payer: Self-pay

## 2019-04-29 DIAGNOSIS — J449 Chronic obstructive pulmonary disease, unspecified: Secondary | ICD-10-CM | POA: Diagnosis not present

## 2019-04-29 DIAGNOSIS — I129 Hypertensive chronic kidney disease with stage 1 through stage 4 chronic kidney disease, or unspecified chronic kidney disease: Secondary | ICD-10-CM

## 2019-04-29 DIAGNOSIS — N182 Chronic kidney disease, stage 2 (mild): Secondary | ICD-10-CM

## 2019-04-29 NOTE — Chronic Care Management (AMB) (Signed)
  Chronic Care Management   Outreach Note  04/29/2019 Name: Geoffrey Frank MRN: [id number] DOB: [birth year]  Referred by: Glendale Chard, MD Reason for referral : Chronic Care Management (CCM RNCM Telephone Follow up )   An unsuccessful telephone outreach was attempted today. The patient was referred to the case management team by Glendale Chard MD for assistance with chronic care management and care coordination.   Follow Up Plan: Telephone follow up appointment with care management team member scheduled for: 05/27/19  [name], RN, BSN, CCM Care Management Coordinator [place] [facility]  Direct Phone: [phone]

## 2019-05-04 ENCOUNTER — Ambulatory Visit: Payer: Self-pay

## 2019-05-04 DIAGNOSIS — N182 Chronic kidney disease, stage 2 (mild): Secondary | ICD-10-CM

## 2019-05-04 DIAGNOSIS — I129 Hypertensive chronic kidney disease with stage 1 through stage 4 chronic kidney disease, or unspecified chronic kidney disease: Secondary | ICD-10-CM

## 2019-05-04 NOTE — Chronic Care Management (AMB) (Signed)
Chronic Care Management   Social Work Follow Up Note  05/04/2019 Name: Geoffrey Frank MRN: [id number] DOB: 06/26/1926  Geoffrey Frank is a 85 y.o. year old male who is a primary care patient of Glendale Chard, MD. The CCM team was consulted for assistance with [employer] .   Review of patient status, including review of consultants reports, other relevant assessments, and collaboration with appropriate care team members and the patient's provider was performed as part of comprehensive patient evaluation and provision of chronic care management services.     Outpatient Encounter Medications as of 05/04/2019  Medication Sig  . acetaminophen (TYLENOL) 500 MG tablet Take 1,000 mg by mouth daily.   [name] amLODipine-olmesartan (AZOR) 10-40 MG tablet TAKE 1 TABLET BY MOUTH EVERY DAY (Patient taking differently: Take 0.5 tablets by mouth daily. )  . aspirin 81 MG tablet Take 81 mg by mouth daily.  [name] docusate sodium (COLACE) 100 MG capsule Take 100 mg by mouth daily as needed for mild constipation.   . lidocaine (XYLOCAINE) 5 % ointment Apply 1 application topically as needed. (Patient taking differently: Apply 1 application topically daily as needed for mild pain. )  . linaclotide (LINZESS) 145 MCG CAPS capsule Take 145 mcg by mouth daily before breakfast.  . metoprolol tartrate (LOPRESSOR) 25 MG tablet TAKE 1 TABLET BY MOUTH TWICE A DAY (Patient taking differently: Take 25 mg by mouth 2 (two) times daily. )  . nystatin cream (MYCOSTATIN) Apply 1 application topically 2 (two) times daily. Apply to affected area twice a day  . polyethylene glycol powder (MIRALAX) powder Take 17 g by mouth daily as needed for moderate constipation or severe constipation. (Patient taking differently: Take 17 g by mouth daily as needed for mild constipation. )   No facility-administered encounter medications on file as of 05/04/2019.      Goals Addressed            This Visit's Progress     Patient Stated    . COMPLETED: "I'm slowing down tremendously and may need some help in the future" (pt-stated) Goal not met due to patients inability to progress       Current Barriers:  . Level of care concerns . ADL IADL limitations . Social Isolation . Limited education about long term care options such as independent living and assisted living  Clinical Social Work Clinical Goal(s):  [name] Over the next 30 days, patient will be educated on long term care options and the difference between independent living and assisted living communities. Not met; re-established goal 6/15 . 03/18/19- Over the next 30 days patient will work with SW to identify in home caregiver options to assist with ADL's and iADL's.Not Met  CCM SW Interventions: Completed 05/04/19 . Outbound call to the patient to assess progression of patient stated goal . Determined the patient has yet to follow up with a VA benefits specialist regarding aide and attendance in the home . Assessed for barriers to completing this call, the patient stated "well you know I have been in the hospital twice this month. I just don't have the time. But I have their number and I will call" . Encouraged the patient to initiate this process as soon as he was ready . Confirmed the patient has the appropriate contact information . Advised the patient to contact CCM SW if assistance is needed with future resource goals  Patient Self Care Activities:  . Self administers medications as prescribed . Attends all scheduled provider  appointments . Calls pharmacy for medication refills . Calls provider office for new concerns or questions  Please see past updates related to this goal by clicking on the "Past Updates" button in the selected goal          Follow Up Plan: No follow up planned at this time. The patient will remain active with CCM RN Case Manager.   [name], BSW, CDP Social Worker, Certified Dementia Practitioner [place] [facility] [place] [facility]  [phone]  Total time spent performing care coordination and/or care management activities with the patient by phone or face to face = 6 minutes.

## 2019-05-04 NOTE — Patient Instructions (Signed)
Social Worker Visit Information  Goals we discussed today:  Goals Addressed            This Visit's Progress     Patient Stated   . COMPLETED: "I'm slowing down tremendously and may need some help in the future" (pt-stated)       Current Barriers:  . Level of care concerns . ADL IADL limitations . Social Isolation . Limited education about long term care options such as independent living and assisted living  Clinical Social Work Clinical Goal(s):  [name] Over the next 30 days, patient will be educated on long term care options and the difference between independent living and assisted living communities. Not met; re-established goal 6/15 . 03/18/19- Over the next 30 days patient will work with SW to identify in home caregiver options to assist with ADL's and iADL's.Not Met  CCM SW Interventions: Completed 05/04/19 . Outbound call to the patient to assess progression of patient stated goal . Determined the patient has yet to follow up with a VA benefits specialist regarding aide and attendance in the home . Assessed for barriers to completing this call, the patient stated "well you know I have been in the hospital twice this month. I just don't have the time. But I have their number and I will call" . Encouraged the patient to initiate this process as soon as he was ready . Confirmed the patient has the appropriate contact information . Advised the patient to contact CCM SW if assistance is needed with future resource goals  Patient Self Care Activities:  . Self administers medications as prescribed . Attends all scheduled provider appointments . Calls pharmacy for medication refills . Calls provider office for new concerns or questions  Please see past updates related to this goal by clicking on the "Past Updates" button in the selected goal          Follow Up Plan: No further CCM SW follow up planned at this time. Please call me for future resource needs.   [name],  BSW, CDP Social Worker, Certified Dementia Practitioner [place] [facility] [place] [facility] [phone]

## 2019-05-06 ENCOUNTER — Telehealth: Payer: Self-pay

## 2019-05-27 ENCOUNTER — Ambulatory Visit: Payer: Self-pay

## 2019-05-27 ENCOUNTER — Telehealth: Payer: Self-pay

## 2019-05-27 DIAGNOSIS — I129 Hypertensive chronic kidney disease with stage 1 through stage 4 chronic kidney disease, or unspecified chronic kidney disease: Secondary | ICD-10-CM

## 2019-05-27 DIAGNOSIS — J449 Chronic obstructive pulmonary disease, unspecified: Secondary | ICD-10-CM

## 2019-05-27 DIAGNOSIS — N182 Chronic kidney disease, stage 2 (mild): Secondary | ICD-10-CM

## 2019-05-27 NOTE — Chronic Care Management (AMB) (Signed)
  Chronic Care Management   Outreach Note  05/27/2019 Name: Geoffrey Frank MRN: [id number] DOB: Jan 06, 1926  Referred by: Glendale Chard, MD Reason for referral : Chronic Care Management (CCM RNCM Telephone Outreach )   An unsuccessful telephone outreach was attempted today. The patient was referred to the case management team by Glendale Chard MD for assistance with chronic care management and care coordination.   Follow Up Plan: Telephone follow up appointment with care management team member scheduled for: 06/28/19   [name], RN, BSN, CCM Care Management Coordinator [place] [facility]  Direct Phone: [phone]

## 2019-05-31 ENCOUNTER — Ambulatory Visit (INDEPENDENT_AMBULATORY_CARE_PROVIDER_SITE_OTHER): Payer: Medicare Other

## 2019-05-31 DIAGNOSIS — I129 Hypertensive chronic kidney disease with stage 1 through stage 4 chronic kidney disease, or unspecified chronic kidney disease: Secondary | ICD-10-CM

## 2019-05-31 DIAGNOSIS — J449 Chronic obstructive pulmonary disease, unspecified: Secondary | ICD-10-CM | POA: Diagnosis not present

## 2019-05-31 DIAGNOSIS — N182 Chronic kidney disease, stage 2 (mild): Secondary | ICD-10-CM

## 2019-05-31 DIAGNOSIS — K5900 Constipation, unspecified: Secondary | ICD-10-CM

## 2019-06-01 NOTE — Chronic Care Management (AMB) (Signed)
Chronic Care Management   Follow Up Note   05/31/2019 Name: Geoffrey Frank MRN: [id number] DOB: 05/19/1926  Referred by: Geoffrey Chard, MD Reason for referral : Chronic Care Management (CCM RNCM Telephone Collaboration )   Geoffrey Frank is a 85 y.o. year old male who is a primary care patient of Geoffrey Chard, MD. The CCM team was consulted for assistance with chronic disease management and care coordination needs.    Review of patient status, including review of consultants reports, relevant laboratory and other test results, and collaboration with appropriate care team members and the patient's provider was performed as part of comprehensive patient evaluation and provision of chronic care management services.    SDOH (Social Determinants of Health) screening performed today: Physical Activity. See Care Plan for related entries.   Advanced Directives Status: N See Care Plan and [employer] application for related entries.  I received an inbound call from Geoffrey Frank concerning his ongoing need for caregiver/custodial care assistance. We also discussed that is constipation has resolved with daily use of Miralax.   Outpatient Encounter Medications as of 05/31/2019  Medication Sig   acetaminophen (TYLENOL) 500 MG tablet Take 1,000 mg by mouth daily.    amLODipine-olmesartan (AZOR) 10-40 MG tablet TAKE 1 TABLET BY MOUTH EVERY DAY (Patient taking differently: Take 0.5 tablets by mouth daily. )   aspirin 81 MG tablet Take 81 mg by mouth daily.   docusate sodium (COLACE) 100 MG capsule Take 100 mg by mouth daily as needed for mild constipation.    lidocaine (XYLOCAINE) 5 % ointment Apply 1 application topically as needed. (Patient taking differently: Apply 1 application topically daily as needed for mild pain. )   linaclotide (LINZESS) 145 MCG CAPS capsule Take 145 mcg by mouth daily before breakfast.   metoprolol tartrate (LOPRESSOR) 25 MG tablet TAKE 1 TABLET BY MOUTH TWICE A DAY  (Patient taking differently: Take 25 mg by mouth 2 (two) times daily. )   nystatin cream (MYCOSTATIN) Apply 1 application topically 2 (two) times daily. Apply to affected area twice a day   polyethylene glycol powder (MIRALAX) powder Take 17 g by mouth daily as needed for moderate constipation or severe constipation. (Patient taking differently: Take 17 g by mouth daily as needed for mild constipation. )   No facility-administered encounter medications on file as of 05/31/2019.      Goals Addressed      Patient Stated    COMPLETED: "I am having problems with constipation" (pt-stated)       Current Barriers:   Knowledge Deficits related to Self health management for constipation   Nurse Case Manager Clinical Goal(s):   Over the next 30 days, patient will verbalize understanding of plan for Self health management of constipation Goal Met  Over the next 60 days, patient will not experience any ED visits and or IP admissions secondary to constipation or fecal impaction - Not Met  04/08/19: Over the next 14 days patient will have [place] [facility] services in place for medication management and PT eval Goal Met  CCM RN CM Interventions:  05/31/19 call completed with patient   Received inbound call from patient; assessed for relief from constipation - patient states this condition has improved for him since his last ED visit; Geoffrey Frank reports he is now taking Miralax every night at bedtime consistently w/o missed doses and this has resolved his constipation - he is experiencing regular bowel movements  Confirmed Geoffrey Frank has received the printed materials mailed to  his home regarding constipation and aging - he states, "I am doing most of these things"; patient denies having any further questions or concerns related to impaired bowel disturbance  Discussed plans with patient for ongoing care management follow up and provided patient with direct contact information for care management team  Patient  Self Care Activities:   Self administers medications as prescribed  Attends all scheduled provider appointments  Calls pharmacy for medication refills  Performs ADL's independently  Performs IADL's independently  Calls provider office for new concerns or questions  Please see past updates related to this goal by clicking on the "Past Updates" button in the selected goal        "I need help at home but don't think I can qualify" (pt-stated)       Current Barriers:   Knowledge Deficits related to eligibility for VA benefits to assist with caregiver/custodial care  Lacks caregiver support   Self Care Deficit  Nurse Case Manager Clinical Goal(s):   Over the next 30 days, patient will work with the [employer] office to address needs related to [place] benefits including caregiver and or custodial care assistance  CCM RN CM Interventions:  05/31/19 call completed with patient    Received an inbound call from Geoffrey Frank stating he still needs help in his home but is not sure he will qualify for help from the [place]  Evaluation of current treatment plan related to verification of VA benefits and patient's adherence to plan as established by provider.  Advised patient to contact the [place] office; discussed the [place] personnel in this office can help determine what benefits Geoffrey Frank may be eligible for and will help provide him with the details/application needed in order to apply for benefits; discussed this personnel can also help him complete any VA applications over the phone if needed; provided patient with the contact person to speak with, Geoffrey Frank [phone] - Geoffrey Frank recorded this information and plans on calling this agency tomorrow   Collaborated with embedded BSW Geoffrey Frank regarding the information provided to Geoffrey Frank and his intentions to reach out to this office to get assistance and verification of any/all VA benefits  that he may quality for including caregiver/custodial care  Discussed plans with patient for ongoing care management follow up and provided patient with direct contact information for care management team  Patient Self Care Activities:   Self administers medications as prescribed  Attends all scheduled provider appointments  Calls pharmacy for medication refills  Performs ADL's independently  Performs IADL's independently  Calls provider office for new concerns or questions  Initial goal documentation       Other    COMPLETED: Assist with Disease Management and Community Resources       Current Barriers:   Knowledge Barriers related to resources and support available to address needs related to disease management and community resources.  Case Manager Clinical Goal(s):   Over the next 30 days, patient will work with the CCM team to address needs related to depression, meal options, long term plan for housing and disease process and self health management of urinary/bladder issues. Goal Met  Interventions:   Collaborated with BSW and initiated plan of care to address needs related to community resources and patient specified disease management  See other patient centered goals  Patient Self Care Activities:   Self administers medications as prescribed  Attends all scheduled provider appointments  Calls provider office for  new concerns or questions  Please see past updates related to this goal by clicking on the "Past Updates" button in the selected goal          Telephone follow up appointment with care management team member scheduled for: 06/28/19   [name], RN, BSN, CCM Care Management Coordinator [place] [facility]  Direct Phone: [phone]

## 2019-06-01 NOTE — Patient Instructions (Signed)
Visit Information  Goals Addressed      Patient Stated   . COMPLETED: "I am having problems with constipation" (pt-stated)       Current Barriers:  [name] Knowledge Deficits related to Self health management for constipation   Nurse Case Manager Clinical Goal(s):  [name] Over the next 30 days, patient will verbalize understanding of plan for Self health management of constipation Goal Met . Over the next 60 days, patient will not experience any ED visits and or IP admissions secondary to constipation or fecal impaction - Not Met . 04/08/19: Over the next 14 days patient will have [facility] services in place for medication management and PT eval Goal Met  CCM RN CM Interventions:  05/31/19 call completed with patient  . Received inbound call from patient; assessed for relief from constipation - patient states this condition has improved for him since his last ED visit; Mr. Nidiffer reports he is now taking Miralax every night at bedtime consistently w/o missed doses and this has resolved his constipation - he is experiencing regular bowel movements . Confirmed Mr. Chandley has received the printed materials mailed to his home regarding constipation and aging - he states, "I am doing most of these things"; patient denies having any further questions or concerns related to impaired bowel disturbance . Discussed plans with patient for ongoing care management follow up and provided patient with direct contact information for care management team  Patient Self Care Activities:  . Self administers medications as prescribed . Attends all scheduled provider appointments . Calls pharmacy for medication refills . Performs ADL's independently . Performs IADL's independently . Calls provider office for new concerns or questions  Please see past updates related to this goal by clicking on the "Past Updates" button in the selected goal     . "I need help at home but don't think I can qualify" (pt-stated)       Current  Barriers:  [name] Knowledge Deficits related to eligibility for VA benefits to assist with caregiver/custodial care . Lacks caregiver support.  . Self Care Deficit  Nurse Case Manager Clinical Goal(s):  [name] Over the next 30 days, patient will work with the [employer] [place] office to address needs related to [place] benefits including caregiver and or custodial care assistance  CCM RN CM Interventions:  05/31/19 call completed with patient   . Received an inbound call from Mr. Platte stating he still needs help in his home but is not sure he will qualify for help from the [place] . Evaluation of current treatment plan related to verification of VA benefits and patient's adherence to plan as established by provider. . Advised patient to contact the [place] office; discussed the [place] personnel in this office can help determine what benefits Mr. Gentzler may be eligible for and will help provide him with the details/application needed in order to apply for benefits; discussed this personnel can also help him complete any VA applications over the phone if needed; provided patient with the contact person to speak with, Ricki Miller [phone] - Mr. Bonsall recorded this information and plans on calling this agency tomorrow  . Collaborated with embedded BSW [name] regarding the information provided to Mr. Kapaun and his intentions to reach out to this office to get assistance and verification of any/all VA benefits that he may quality for including caregiver/custodial care . Discussed plans with patient for ongoing care management follow up and provided patient with direct contact information  for care management team  Patient Self Care Activities:  . Self administers medications as prescribed . Attends all scheduled provider appointments . Calls pharmacy for medication refills . Performs ADL's independently . Performs IADL's independently . Calls provider office for new  concerns or questions  Initial goal documentation       Other   . COMPLETED: Assist with Disease Management and Community Resources       Current Barriers:  [name] Knowledge Barriers related to resources and support available to address needs related to disease management and community resources.  Case Manager Clinical Goal(s):  [name] Over the next 30 days, patient will work with the CCM team to address needs related to depression, meal options, long term plan for housing and disease process and self health management of urinary/bladder issues. Goal Met  Interventions:  . Collaborated with BSW and initiated plan of care to address needs related to community resources and patient specified disease management . See other patient centered goals  Patient Self Care Activities:  . Self administers medications as prescribed . Attends all scheduled provider appointments . Calls provider office for new concerns or questions  Please see past updates related to this goal by clicking on the "Past Updates" button in the selected goal        The patient verbalized understanding of instructions provided today and declined a print copy of patient instruction materials.   Telephone follow up appointment with care management team member scheduled for: 06/28/19  [name], RN, BSN, CCM Care Management Coordinator [facility]  Direct Phone: [phone]

## 2019-06-03 ENCOUNTER — Ambulatory Visit (INDEPENDENT_AMBULATORY_CARE_PROVIDER_SITE_OTHER): Payer: Medicare Other | Admitting: Surgery

## 2019-06-03 ENCOUNTER — Other Ambulatory Visit: Payer: Self-pay

## 2019-06-03 ENCOUNTER — Encounter: Payer: Self-pay | Admitting: Surgery

## 2019-06-03 VITALS — Ht 72.0 in | Wt 174.0 lb

## 2019-06-03 DIAGNOSIS — M65341 Trigger finger, right ring finger: Secondary | ICD-10-CM

## 2019-06-03 DIAGNOSIS — M25521 Pain in right elbow: Secondary | ICD-10-CM | POA: Diagnosis not present

## 2019-06-03 DIAGNOSIS — R600 Localized edema: Secondary | ICD-10-CM | POA: Diagnosis not present
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
## 2019-06-04 ENCOUNTER — Telehealth: Payer: Self-pay

## 2019-06-04 NOTE — Telephone Encounter (Signed)
Called pt to check on him and set up an appt. No answer left vm  pls call pt to schedule appt for LE swelling. thx IF his sx get worse, advise him to go to ER

## 2019-06-09 ENCOUNTER — Encounter: Payer: Self-pay | Admitting: Internal Medicine

## 2019-06-09 ENCOUNTER — Other Ambulatory Visit: Payer: Self-pay | Admitting: Internal Medicine

## 2019-06-09 ENCOUNTER — Other Ambulatory Visit: Payer: Self-pay

## 2019-06-09 ENCOUNTER — Ambulatory Visit (INDEPENDENT_AMBULATORY_CARE_PROVIDER_SITE_OTHER): Payer: Medicare Other | Admitting: Internal Medicine

## 2019-06-09 VITALS — BP 128/72 | Temp 97.4°F | Wt 187.0 lb

## 2019-06-09 DIAGNOSIS — R600 Localized edema: Secondary | ICD-10-CM

## 2019-06-09 DIAGNOSIS — N182 Chronic kidney disease, stage 2 (mild): Secondary | ICD-10-CM

## 2019-06-09 DIAGNOSIS — Z23 Encounter for immunization: Secondary | ICD-10-CM

## 2019-06-09 DIAGNOSIS — R0602 Shortness of breath: Secondary | ICD-10-CM

## 2019-06-09 DIAGNOSIS — K5900 Constipation, unspecified: Secondary | ICD-10-CM

## 2019-06-09 DIAGNOSIS — I129 Hypertensive chronic kidney disease with stage 1 through stage 4 chronic kidney disease, or unspecified chronic kidney disease: Secondary | ICD-10-CM | POA: Diagnosis not present
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
## 2019-06-09 NOTE — Patient Instructions (Signed)
Peripheral Edema  Peripheral edema is swelling that is caused by a buildup of fluid. Peripheral edema most often affects the lower legs, ankles, and feet. It can also develop in the arms, hands, and face. The area of the body that has peripheral edema will look swollen. It may also feel heavy or warm. Your clothes may start to feel tight. Pressing on the area may make a temporary dent in your skin. You may not be able to move your swollen arm or leg as much as usual. There are many causes of peripheral edema. It can happen because of a complication of other conditions such as congestive heart failure, kidney disease, or a problem with your blood circulation. It also can be a side effect of certain medicines or because of an infection. It often happens to women during pregnancy. Sometimes, the cause is not known. Follow these instructions at home: Managing pain, stiffness, and swelling   Raise (elevate) your legs while you are sitting or lying down.  Move around often to prevent stiffness and to lessen swelling.  Do not sit or stand for long periods of time.  Wear support stockings as told by your health care provider. Medicines  Take over-the-counter and prescription medicines only as told by your health care provider.  Your health care provider may prescribe medicine to help your body get rid of excess water (diuretic). General instructions  Pay attention to any changes in your symptoms.  Follow instructions from your health care provider about limiting salt (sodium) in your diet. Sometimes, eating less salt may reduce swelling.  Moisturize skin daily to help prevent skin from cracking and draining.  Keep all follow-up visits as told by your health care provider. This is important. Contact a health care provider if you have:  A fever.  Edema that starts suddenly or is getting worse, especially if you are pregnant or have a medical condition.  Swelling in only one leg.  Increased  swelling, redness, or pain in one or both of your legs.  Drainage or sores at the area where you have edema. Get help right away if you:  Develop shortness of breath, especially when you are lying down.  Have pain in your chest or abdomen.  Feel weak.  Feel faint. Summary  Peripheral edema is swelling that is caused by a buildup of fluid. Peripheral edema most often affects the lower legs, ankles, and feet.  Move around often to prevent stiffness and to lessen swelling. Do not sit or stand for long periods of time.  Pay attention to any changes in your symptoms.  Contact a health care provider if you have edema that starts suddenly or is getting worse, especially if you are pregnant or have a medical condition.  Get help right away if you develop shortness of breath, especially when lying down. This information is not intended to replace advice given to you by your health care provider. Make sure you discuss any questions you have with your health care provider. Document Released: 10/03/2004 Document Revised: 05/20/2018 Document Reviewed: 05/20/2018 Elsevier Patient Education  2020 Elsevier Inc.  

## 2019-06-10 ENCOUNTER — Telehealth (HOSPITAL_COMMUNITY): Payer: Self-pay | Admitting: *Deleted

## 2019-06-10 ENCOUNTER — Telehealth (HOSPITAL_COMMUNITY): Payer: Self-pay

## 2019-06-10 LAB — BMP8+EGFR
BUN/Creatinine Ratio: 26 — ABNORMAL HIGH (ref 10–24)
BUN: 25 mg/dL (ref 10–36)
CO2: 21 mmol/L (ref 20–29)
Calcium: 9.3 mg/dL (ref 8.6–10.2)
Chloride: 106 mmol/L (ref 96–106)
Creatinine, Ser: 0.95 mg/dL (ref 0.76–1.27)
GFR calc Af Amer: 81 mL/min/{1.73_m2} (ref >59)
GFR calc non Af Amer: 70 mL/min/{1.73_m2} (ref >59)
Glucose: 101 mg/dL — ABNORMAL HIGH (ref 65–99)
Potassium: 4.8 mmol/L (ref 3.5–5.2)
Sodium: 140 mmol/L (ref 134–144)

## 2019-06-10 LAB — BRAIN NATRIURETIC PEPTIDE: BNP: 187.3 pg/mL — ABNORMAL HIGH (ref 0.0–100.0)

## 2019-06-10 NOTE — Telephone Encounter (Signed)
The above patient or their representative was contacted and gave the following answers to these questions:         Do you have any of the following symptoms?  no  Fever                    Cough                   Shortness of breath  Do  you have any of the following other symptoms? no   muscle pain         vomiting,        diarrhea        rash         weakness        red eye        abdominal pain         bruising          bruising or bleeding              joint pain           severe headache    Have you been in contact with someone who was or has been sick in the past 2 weeks? no  Yes                 Unsure                         Unable to assess   Does the person that you were in contact with have any of the following symptoms?   Cough         shortness of breath           muscle pain         vomiting,            diarrhea            rash            weakness           fever            red eye           abdominal pain           bruising  or  bleeding                joint pain                severe headache               Have you  or someone you have been in contact with traveled internationally in th last month?         If yes, which countries?   Have you  or someone you have been in contact with traveled outside [place] in th last month?         If yes, which state and city?   COMMENTS OR ACTION PLAN FOR THIS PATIENT:          

## 2019-06-10 NOTE — Telephone Encounter (Signed)

## 2019-06-11 ENCOUNTER — Other Ambulatory Visit: Payer: Self-pay | Admitting: Internal Medicine

## 2019-06-11 ENCOUNTER — Ambulatory Visit (HOSPITAL_COMMUNITY)
Admission: RE | Admit: 2019-06-11 | Discharge: 2019-06-11 | Disposition: A | Discharge status: Home / Self Care | Payer: Medicare Other | Source: Ambulatory Visit | Attending: Family | Admitting: Family

## 2019-06-11 ENCOUNTER — Other Ambulatory Visit: Payer: Self-pay

## 2019-06-11 DIAGNOSIS — R600 Localized edema: Secondary | ICD-10-CM | POA: Diagnosis not present

## 2019-06-11 MED ORDER — POTASSIUM CHLORIDE ER 8 MEQ PO TBCR: EXTENDED_RELEASE_TABLET | ORAL | 1 refills | Status: DC

## 2019-06-11 MED ORDER — FUROSEMIDE 20 MG PO TABS: ORAL_TABLET | ORAL | 1 refills | Status: DC

## 2019-06-28 ENCOUNTER — Ambulatory Visit (INDEPENDENT_AMBULATORY_CARE_PROVIDER_SITE_OTHER): Payer: Medicare Other

## 2019-06-28 ENCOUNTER — Other Ambulatory Visit: Payer: Self-pay

## 2019-06-28 ENCOUNTER — Telehealth: Payer: Self-pay

## 2019-06-28 DIAGNOSIS — N182 Chronic kidney disease, stage 2 (mild): Secondary | ICD-10-CM

## 2019-06-28 DIAGNOSIS — I129 Hypertensive chronic kidney disease with stage 1 through stage 4 chronic kidney disease, or unspecified chronic kidney disease: Secondary | ICD-10-CM | POA: Diagnosis not present

## 2019-06-28 DIAGNOSIS — J449 Chronic obstructive pulmonary disease, unspecified: Secondary | ICD-10-CM | POA: Diagnosis not present

## 2019-06-29 NOTE — Chronic Care Management (AMB) (Signed)
Chronic Care Management   Follow Up Note   06/28/2019 Name: Geoffrey Frank MRN: [id number] DOB: 07/04/1926  Referred by: Glendale Chard, MD Reason for referral : Chronic Care Management (CCM RNCM Telephone Follow up )   Geoffrey Frank is a 85 y.o. year old male who is a primary care patient of Glendale Chard, MD. The CCM team was consulted for assistance with chronic disease management and care coordination needs.    Review of patient status, including review of consultants reports, relevant laboratory and other test results, and collaboration with appropriate care team members and the patient's provider was performed as part of comprehensive patient evaluation and provision of chronic care management services.    SDOH (Social Determinants of Health) screening performed today: None. See Care Plan for related entries.   Advanced Directives Status: N See Care Plan and Vynca application for related entries.  I spoke with Geoffrey Frank by telephone today to follow up on his lower extremity edema.   Outpatient Encounter Medications as of 06/28/2019  Medication Sig  . furosemide (LASIX) 20 MG tablet One tab po qd prn  . acetaminophen (TYLENOL) 500 MG tablet Take 1,000 mg by mouth daily.   [name] amLODipine-olmesartan (AZOR) 10-40 MG tablet TAKE 1 TABLET BY MOUTH EVERY DAY (Patient taking differently: Take 0.5 tablets by mouth daily. )  . aspirin 81 MG tablet Take 81 mg by mouth daily.  [name] docusate sodium (COLACE) 100 MG capsule Take 100 mg by mouth daily as needed for mild constipation.   . lidocaine (XYLOCAINE) 5 % ointment Apply 1 application topically as needed. (Patient taking differently: Apply 1 application topically daily as needed for mild pain. )  . linaclotide (LINZESS) 145 MCG CAPS capsule Take 145 mcg by mouth daily before breakfast.  . metoprolol tartrate (LOPRESSOR) 25 MG tablet TAKE 1 TABLET BY MOUTH TWICE A DAY (Patient taking differently: Take 25 mg by mouth 2 (two) times daily. )   . nystatin cream (MYCOSTATIN) Apply 1 application topically 2 (two) times daily. Apply to affected area twice a day  . polyethylene glycol powder (MIRALAX) powder Take 17 g by mouth daily as needed for moderate constipation or severe constipation. (Patient taking differently: Take 17 g by mouth daily as needed for mild constipation. )  . potassium chloride (KLOR-CON) 8 MEQ tablet Take one tab po qd prn (ONLY take when you take the furosemide)   No facility-administered encounter medications on file as of 06/28/2019.      Goals Addressed      Patient Stated   . "Dr. Baird Cancer started me on a fluid pill" (pt-stated)       Current Barriers:  [name] Knowledge Deficits related to diagnosis and treatment of lower extremity edema  Nurse Case Manager Clinical Goal(s):  [name] Over the next 30 days, patient will verbalize understanding of plan for diagnosis and treatment of lower extremity edema  CCM RN CM Interventions:  06/28/19 call completed with patient   Evaluation of current treatment plan related to lower extremity Edema and patient's adherence to plan as established by provider. Provided education to patient re: importance of keeping the CCM team and or PCP provider well informed of any new or worsening edema; discussed the importance of patient adhering to wearing his TED hose as directed, patient encouraged to keep his lower extremities elevated while resting; patient reports this condition has improved since starting Furosemide and wearing TED hose Reviewed medications with patient and discussed indication, dosage and frequency of newly  added fluid pill, Furosemide and reports adherence  Discussed plans with patient for ongoing care management follow up and provided patient with direct contact information for care management team  Patient Self Care Activities:  . Self administers medications as prescribed . Attends all scheduled provider appointments . Calls pharmacy for medication refills .  Attends church or other social activities . Performs ADL's independently . Performs IADL's independently . Calls provider office for new concerns or questions   Initial goal documentation     . "I think I need PT again" (pt-stated)       Current Barriers:  [name] Knowledge Deficits related to treatment management of impaired physical mobility and poor gait /balance  Nurse Case Manager Clinical Goal(s):  [name] Over the next 30 days, patient will verbalize understanding of plan for in home PT/OT  CCM RN CM Interventions:  06/28/19 call completed with patient   . Evaluation of current treatment plan related to impaired physical mobility and poor gait/balance and patient's adherence to plan as established by provider. . Advised patient to discuss his mobility/gait concerns with Dr. Baird Cancer at next [place] scheduled for 06/30/19 . Collaborated with Dr. Baird Cancer regarding patients request for ongoing in home PT/OT to further assist with strengthening and balance - advised I am unable to determine which [facility] provided him services in July . Discussed plans with patient for ongoing care management follow up and provided patient with direct contact information for care management team . Placed outbound call to [facility], spoke with Benjamine Mola who advised this agency did not provide services to [name] in July . Placed outbound call to [facility], spoke with Webb Silversmith who advised this agency did not provide services to [name] in July   Patient Self Care Activities:  . Self administers medications as prescribed . Attends all scheduled provider appointments . Calls pharmacy for medication refills . Attends church or other social activities . Performs ADL's independently . Performs IADL's independently . Calls provider office for new concerns or questions  Initial goal documentation        Telephone follow up appointment with care management team member scheduled for: 07/20/19  [name], RN, BSN, CCM Care Management Coordinator [facility]  Direct Phone: [phone]

## 2019-06-29 NOTE — Patient Instructions (Signed)
Visit Information  Goals Addressed      Patient Stated   . "Dr. Baird Cancer started me on a fluid pill" (pt-stated)       Current Barriers:  [name] Knowledge Deficits related to diagnosis and treatment of lower extremity edema  Nurse Case Manager Clinical Goal(s):  [name] Over the next 30 days, patient will verbalize understanding of plan for diagnosis and treatment of lower extremity edema  CCM RN CM Interventions:  06/28/19 call completed with patient   Evaluation of current treatment plan related to lower extremity Edema and patient's adherence to plan as established by provider. Provided education to patient re: importance of keeping the CCM team and or PCP provider well informed of any new or worsening edema; discussed the importance of patient adhering to wearing his TED hose as directed, patient encouraged to keep his lower extremities elevated while resting; patient reports this condition has improved since starting Furosemide and wearing TED hose Reviewed medications with patient and discussed indication, dosage and frequency of newly added fluid pill, Furosemide and reports adherence  Discussed plans with patient for ongoing care management follow up and provided patient with direct contact information for care management team  Patient Self Care Activities:  . Self administers medications as prescribed . Attends all scheduled provider appointments . Calls pharmacy for medication refills . Attends church or other social activities . Performs ADL's independently . Performs IADL's independently . Calls provider office for new concerns or questions   Initial goal documentation     . "I think I need PT again" (pt-stated)       Current Barriers:  [name] Knowledge Deficits related to treatment management of impaired physical mobility and poor gait /balance  Nurse Case Manager Clinical Goal(s):  [name] Over the next 30 days, patient will verbalize understanding of plan for in home PT/OT  CCM RN CM  Interventions:  06/28/19 call completed with patient   . Evaluation of current treatment plan related to impaired physical mobility and poor gait/balance and patient's adherence to plan as established by provider. . Advised patient to discuss his mobility/gait concerns with Dr. Baird Cancer at next [place] scheduled for 06/30/19 . Collaborated with Dr. Baird Cancer regarding patients request for ongoing in home PT/OT to further assist with strengthening and balance - advised I am unable to determine which [facility] provided him services in July . Discussed plans with patient for ongoing care management follow up and provided patient with direct contact information for care management team . Placed outbound call to [facility], spoke with Benjamine Mola who advised this agency did not provide services to Mr. Marcel in July . Placed outbound call to [facility], spoke with Webb Silversmith who advised this agency did not provide services to Mr. Riner in July   Patient Self Care Activities:  . Self administers medications as prescribed . Attends all scheduled provider appointments . Calls pharmacy for medication refills . Attends church or other social activities . Performs ADL's independently . Performs IADL's independently . Calls provider office for new concerns or questions  Initial goal documentation        The patient verbalized understanding of instructions provided today and declined a print copy of patient instruction materials.   Telephone follow up appointment with care management team member scheduled for: 06/28/19  [name], RN, BSN, CCM Care Management Coordinator [facility]  Direct Phone: [phone]

## 2019-06-30 ENCOUNTER — Other Ambulatory Visit: Payer: Self-pay

## 2019-06-30 ENCOUNTER — Ambulatory Visit (INDEPENDENT_AMBULATORY_CARE_PROVIDER_SITE_OTHER): Payer: Medicare Other | Admitting: Internal Medicine

## 2019-06-30 ENCOUNTER — Encounter: Payer: Self-pay | Admitting: Internal Medicine

## 2019-06-30 VITALS — BP 132/76 | HR 78 | Temp 98.7°F | Ht 72.0 in | Wt 181.0 lb

## 2019-06-30 DIAGNOSIS — I129 Hypertensive chronic kidney disease with stage 1 through stage 4 chronic kidney disease, or unspecified chronic kidney disease: Secondary | ICD-10-CM | POA: Diagnosis not present

## 2019-06-30 DIAGNOSIS — R600 Localized edema: Secondary | ICD-10-CM

## 2019-06-30 DIAGNOSIS — N182 Chronic kidney disease, stage 2 (mild): Secondary | ICD-10-CM

## 2019-06-30 DIAGNOSIS — R269 Unspecified abnormalities of gait and mobility: Secondary | ICD-10-CM

## 2019-06-30 DIAGNOSIS — R2689 Other abnormalities of gait and mobility: Secondary | ICD-10-CM

## 2019-07-01 LAB — BMP8+EGFR
BUN/Creatinine Ratio: 21 (ref 10–24)
BUN: 22 mg/dL (ref 10–36)
CO2: 23 mmol/L (ref 20–29)
Calcium: 9.4 mg/dL (ref 8.6–10.2)
Chloride: 103 mmol/L (ref 96–106)
Creatinine, Ser: 1.03 mg/dL (ref 0.76–1.27)
GFR calc Af Amer: 73 mL/min/{1.73_m2} (ref >59)
GFR calc non Af Amer: 63 mL/min/{1.73_m2} (ref >59)
Glucose: 106 mg/dL — ABNORMAL HIGH (ref 65–99)
Potassium: 4.8 mmol/L (ref 3.5–5.2)
Sodium: 138 mmol/L (ref 134–144)

## 2019-07-05 ENCOUNTER — Other Ambulatory Visit: Payer: Self-pay | Admitting: Internal Medicine
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
## 2019-07-08 ENCOUNTER — Ambulatory Visit: Payer: Medicare Other | Admitting: Surgery

## 2019-07-10 DIAGNOSIS — E1122 Type 2 diabetes mellitus with diabetic chronic kidney disease: Secondary | ICD-10-CM

## 2019-07-10 DIAGNOSIS — R2689 Other abnormalities of gait and mobility: Secondary | ICD-10-CM | POA: Diagnosis not present

## 2019-07-10 DIAGNOSIS — I70209 Unspecified atherosclerosis of native arteries of extremities, unspecified extremity: Secondary | ICD-10-CM

## 2019-07-10 DIAGNOSIS — Z8546 Personal history of malignant neoplasm of prostate: Secondary | ICD-10-CM

## 2019-07-10 DIAGNOSIS — R269 Unspecified abnormalities of gait and mobility: Secondary | ICD-10-CM | POA: Diagnosis not present

## 2019-07-10 DIAGNOSIS — I499 Cardiac arrhythmia, unspecified: Secondary | ICD-10-CM

## 2019-07-10 DIAGNOSIS — N182 Chronic kidney disease, stage 2 (mild): Secondary | ICD-10-CM

## 2019-07-10 DIAGNOSIS — M21371 Foot drop, right foot: Secondary | ICD-10-CM

## 2019-07-10 DIAGNOSIS — J449 Chronic obstructive pulmonary disease, unspecified: Secondary | ICD-10-CM | POA: Diagnosis not present

## 2019-07-10 DIAGNOSIS — I129 Hypertensive chronic kidney disease with stage 1 through stage 4 chronic kidney disease, or unspecified chronic kidney disease: Secondary | ICD-10-CM

## 2019-07-10 DIAGNOSIS — M1990 Unspecified osteoarthritis, unspecified site: Secondary | ICD-10-CM

## 2019-07-15 ENCOUNTER — Ambulatory Visit: Payer: Medicare Other | Admitting: Surgery

## 2019-07-20 ENCOUNTER — Ambulatory Visit (INDEPENDENT_AMBULATORY_CARE_PROVIDER_SITE_OTHER): Payer: Medicare Other

## 2019-07-20 ENCOUNTER — Telehealth: Payer: Self-pay

## 2019-07-20 ENCOUNTER — Other Ambulatory Visit: Payer: Self-pay | Admitting: Nurse Practitioner

## 2019-07-20 ENCOUNTER — Other Ambulatory Visit: Payer: Self-pay

## 2019-07-20 DIAGNOSIS — I129 Hypertensive chronic kidney disease with stage 1 through stage 4 chronic kidney disease, or unspecified chronic kidney disease: Secondary | ICD-10-CM

## 2019-07-20 DIAGNOSIS — N182 Chronic kidney disease, stage 2 (mild): Secondary | ICD-10-CM | POA: Diagnosis not present

## 2019-07-20 DIAGNOSIS — R269 Unspecified abnormalities of gait and mobility: Secondary | ICD-10-CM

## 2019-07-21 NOTE — Chronic Care Management (AMB) (Signed)
Chronic Care Management   Follow Up Note   07/20/2019 Name: Geoffrey Frank MRN: [id number] DOB: 1926/04/12  Referred by: Glendale Chard, MD Reason for referral : Chronic Care Management (CCM RNCM Telephone Follow up)   Geoffrey Frank is a 85 y.o. year old male who is a primary care patient of Glendale Chard, MD. The CCM team was consulted for assistance with chronic disease management and care coordination needs.    Review of patient status, including review of consultants reports, relevant laboratory and other test results, and collaboration with appropriate care team members and the patient's provider was performed as part of comprehensive patient evaluation and provision of chronic care management services.    SDOH (Social Determinants of Health) screening performed today: Depression  . See Care Plan for related entries.   Outbound call placed to Geoffrey Frank to f/u on the start of service for his in home PT.   Outpatient Encounter Medications as of 07/20/2019  Medication Sig  . acetaminophen (TYLENOL) 500 MG tablet Take 1,000 mg by mouth daily.   [name] amLODipine-olmesartan (AZOR) 10-40 MG tablet TAKE 1 TABLET BY MOUTH EVERY DAY (Patient taking differently: Take 0.5 tablets by mouth daily. )  . aspirin 81 MG tablet Take 81 mg by mouth daily.  [name] docusate sodium (COLACE) 100 MG capsule Take 100 mg by mouth daily as needed for mild constipation.   . furosemide (LASIX) 20 MG tablet TAKE 1 TABLET BY MOUTH EVERY DAY AS NEEDED  . lidocaine (XYLOCAINE) 5 % ointment Apply 1 application topically as needed. (Patient not taking: Reported on 06/30/2019)  . linaclotide (LINZESS) 145 MCG CAPS capsule Take 145 mcg by mouth daily before breakfast.  . metoprolol tartrate (LOPRESSOR) 25 MG tablet TAKE 1 TABLET BY MOUTH TWICE A DAY (Patient taking differently: Take 25 mg by mouth 2 (two) times daily. )  . nystatin cream (MYCOSTATIN) Apply 1 application topically 2 (two) times daily. Apply to affected area  twice a day (Patient not taking: Reported on 06/30/2019)  . polyethylene glycol powder (MIRALAX) powder Take 17 g by mouth daily as needed for moderate constipation or severe constipation. (Patient taking differently: Take 17 g by mouth daily as needed for mild constipation. )  . potassium chloride (KLOR-CON) 8 MEQ tablet TAKE 1 TABLET BY MOUTH EVERY DAY AS NEEDED (ONLY TAKE WHILE ON FUROSEMIDE)   No facility-administered encounter medications on file as of 07/20/2019.      Goals Addressed      Patient Stated   . "I get lonely sometimes" (pt-stated)       Current Barriers:  [name] Knowledge Deficits related to resources for Companion Care . Lacks caregiver support.   Nurse Case Manager Clinical Goal(s):  [name] Over the next 30 days, patient will work with the CCM team to address needs related to resources for Companion support  CCM RN CM Interventions:  07/20/19 call completed with patient   . Discussed patient often feels lonely and depressed due to being home alone  . Discussed patient does have a support group he can call on if he needs to  . Provided education to patient re: companion resources . Collaborated with embedded [place] regarding resources that may support Geoffrey Frank with companion care . Discussed plans with patient for ongoing care management follow up and provided patient with direct contact information for care management team . Encouraged patient to keep the CCM team and PCP informed of any new or worsening s/s of depression  Patient Self Care Activities:  . Self administers medications as prescribed . Attends all scheduled provider appointments . Calls pharmacy for medication refills . Performs ADL's independently . Performs IADL's independently . Calls provider office for new concerns or questions  Initial goal documentation     . "I think I need PT again" (pt-stated)       Current Barriers:  [name] Knowledge Deficits related to treatment management of  impaired physical mobility and poor gait /balance  Nurse Case Manager Clinical Goal(s):  [name] Over the next 30 days, patient will verbalize understanding of plan for in home PT/OT  CCM RN CM Interventions:  07/20/19 completed with patient   . Evaluation of current treatment plan related to impaired physical mobility and poor gait/balance and patient's adherence to plan as established by provider. . Advised patient to follow the HEP as recommended and directed by in home PT; discussed patient will resume his PT this week with [facility] . Discussed plans with patient for ongoing care management follow up and provided patient with direct contact information for care management team . Patient encouraged to take his time with ambulation and wear good supportive shoes; discussed importance of changing positions slowly and to avoid bending over when possible . Instructed patient to call the CCM team and or PCP to report any/all falls if they occur . Confirmed patient has the name/contact number for [facility] if needed  Patient Self Care Activities:  . Self administers medications as prescribed . Attends all scheduled provider appointments . Calls pharmacy for medication refills . Attends church or other social activities . Performs ADL's independently . Performs IADL's independently . Calls provider office for new concerns or questions  Please see past updates related to this goal by clicking on the "Past Updates" button in the selected goal         Telephone follow up appointment with care management team member scheduled for: 08/23/19   [name], RN, BSN, CCM Care Management Coordinator [facility]  Direct Phone: [phone]

## 2019-07-21 NOTE — Patient Instructions (Signed)
Visit Information  Goals Addressed      Patient Stated   . "I get lonely sometimes" (pt-stated)       Current Barriers:  [name] Knowledge Deficits related to resources for Companion Care . Lacks caregiver support.   Nurse Case Manager Clinical Goal(s):  [name] Over the next 30 days, patient will work with the CCM team to address needs related to resources for Companion support  CCM RN CM Interventions:  07/20/19 call completed with patient   . Discussed patient often feels lonely and depressed due to being home alone  . Discussed patient does have a support group he can call on if he needs to  . Provided education to patient re: companion resources . Collaborated with embedded [place] regarding resources that may support Mr. Eve with companion care . Discussed plans with patient for ongoing care management follow up and provided patient with direct contact information for care management team . Encouraged patient to keep the CCM team and PCP informed of any new or worsening s/s of depression   Patient Self Care Activities:  . Self administers medications as prescribed . Attends all scheduled provider appointments . Calls pharmacy for medication refills . Performs ADL's independently . Performs IADL's independently . Calls provider office for new concerns or questions  Initial goal documentation     . "I think I need PT again" (pt-stated)       Current Barriers:  [name] Knowledge Deficits related to treatment management of impaired physical mobility and poor gait /balance  Nurse Case Manager Clinical Goal(s):  [name] Over the next 30 days, patient will verbalize understanding of plan for in home PT/OT  CCM RN CM Interventions:  07/20/19 completed with patient   . Evaluation of current treatment plan related to impaired physical mobility and poor gait/balance and patient's adherence to plan as established by provider. . Advised patient to follow the HEP as recommended and directed  by in home PT; discussed patient will resume his PT this week with [facility] . Discussed plans with patient for ongoing care management follow up and provided patient with direct contact information for care management team . Patient encouraged to take his time with ambulation and wear good supportive shoes; discussed importance of changing positions slowly and to avoid bending over when possible . Instructed patient to call the CCM team and or PCP to report any/all falls if they occur . Confirmed patient has the name/contact number for [facility] if needed  Patient Self Care Activities:  . Self administers medications as prescribed . Attends all scheduled provider appointments . Calls pharmacy for medication refills . Attends church or other social activities . Performs ADL's independently . Performs IADL's independently . Calls provider office for new concerns or questions  Please see past updates related to this goal by clicking on the "Past Updates" button in the selected goal        The patient verbalized understanding of instructions provided today and declined a print copy of patient instruction materials.   Telephone follow up appointment with care management team member scheduled for: 08/23/19  [name], RN, BSN, CCM Care Management Coordinator [place] [facility]  Direct Phone: [phone]

## 2019-08-02 ENCOUNTER — Telehealth: Payer: Self-pay

## 2019-08-02 ENCOUNTER — Ambulatory Visit: Payer: Self-pay

## 2019-08-02 DIAGNOSIS — N182 Chronic kidney disease, stage 2 (mild): Secondary | ICD-10-CM

## 2019-08-02 DIAGNOSIS — J449 Chronic obstructive pulmonary disease, unspecified: Secondary | ICD-10-CM

## 2019-08-02 NOTE — Chronic Care Management (AMB) (Signed)
  Chronic Care Management   Outreach Note  08/02/2019 Name: Geoffrey Frank MRN: [id number] DOB: Apr 25, 1926  Referred by: Glendale Chard, MD Reason for referral : Care Coordination   SW placed an unsuccessful outbound call to the patient in response to recent communication received from RN Case Manager requesting SW involvement for caregiver resources. SW left a HIPAA compliant voice message requesting a return call.  Follow Up Plan: SW will follow up with the patient over the next three weeks.  [name], BSW, CDP Social Worker, Certified Dementia Practitioner [place] [facility] [place] [facility] [phone]

## 2019-08-23 ENCOUNTER — Telehealth: Payer: Self-pay

## 2019-08-23 ENCOUNTER — Other Ambulatory Visit: Payer: Self-pay

## 2019-08-23 ENCOUNTER — Ambulatory Visit (INDEPENDENT_AMBULATORY_CARE_PROVIDER_SITE_OTHER): Payer: Medicare Other

## 2019-08-23 DIAGNOSIS — I129 Hypertensive chronic kidney disease with stage 1 through stage 4 chronic kidney disease, or unspecified chronic kidney disease: Secondary | ICD-10-CM | POA: Diagnosis not present

## 2019-08-23 DIAGNOSIS — N182 Chronic kidney disease, stage 2 (mild): Secondary | ICD-10-CM

## 2019-08-23 DIAGNOSIS — J449 Chronic obstructive pulmonary disease, unspecified: Secondary | ICD-10-CM | POA: Diagnosis not present

## 2019-08-24 ENCOUNTER — Ambulatory Visit: Payer: Self-pay

## 2019-08-24 ENCOUNTER — Telehealth: Payer: Self-pay

## 2019-08-24 DIAGNOSIS — N182 Chronic kidney disease, stage 2 (mild): Secondary | ICD-10-CM

## 2019-08-24 NOTE — Chronic Care Management (AMB) (Signed)
Chronic Care Management   Follow Up Note   08/23/2019 Name: CURLEE BOGAN MRN: [id number] DOB: 1925-12-16  Referred by: Glendale Chard, MD Reason for referral : Chronic Care Management (CCM RNCM Telephone Follow up )   BRANDN MCGATH is a 85 y.o. year old male who is a primary care patient of Glendale Chard, MD. The CCM team was consulted for assistance with chronic disease management and care coordination needs.    Review of patient status, including review of consultants reports, relevant laboratory and other test results, and collaboration with appropriate care team members and the patient's provider was performed as part of comprehensive patient evaluation and provision of chronic care management services.    SDOH (Social Determinants of Health) screening performed today: Physical Activity. See Care Plan for related entries.   Placed outbound CCM RN CM follow up call to patient for an update on his in home PT.   Outpatient Encounter Medications as of 08/23/2019  Medication Sig  . acetaminophen (TYLENOL) 500 MG tablet Take 1,000 mg by mouth daily.   [name] amLODipine-olmesartan (AZOR) 10-40 MG tablet TAKE 1 TABLET BY MOUTH EVERY DAY (Patient taking differently: Take 0.5 tablets by mouth daily. )  . aspirin 81 MG tablet Take 81 mg by mouth daily.  [name] docusate sodium (COLACE) 100 MG capsule Take 100 mg by mouth daily as needed for mild constipation.   . furosemide (LASIX) 20 MG tablet TAKE 1 TABLET BY MOUTH EVERY DAY AS NEEDED  . lidocaine (XYLOCAINE) 5 % ointment Apply 1 application topically as needed. (Patient not taking: Reported on 06/30/2019)  . linaclotide (LINZESS) 145 MCG CAPS capsule Take 145 mcg by mouth daily before breakfast.  . metoprolol tartrate (LOPRESSOR) 25 MG tablet TAKE 1 TABLET BY MOUTH TWICE A DAY (Patient taking differently: Take 25 mg by mouth 2 (two) times daily. )  . nystatin cream (MYCOSTATIN) Apply 1 application topically 2 (two) times daily. Apply to affected  area twice a day (Patient not taking: Reported on 06/30/2019)  . polyethylene glycol powder (MIRALAX) powder Take 17 g by mouth daily as needed for moderate constipation or severe constipation. (Patient taking differently: Take 17 g by mouth daily as needed for mild constipation. )  . potassium chloride (KLOR-CON) 8 MEQ tablet TAKE 1 TABLET BY MOUTH EVERY DAY AS NEEDED (ONLY TAKE WHILE ON FUROSEMIDE)   No facility-administered encounter medications on file as of 08/23/2019.     Goals Addressed      Patient Stated   . COMPLETED: "Dr. Baird Cancer started me on a fluid pill" (pt-stated)       Current Barriers:  [name] Knowledge Deficits related to diagnosis and treatment of lower extremity edema . Chronic Disease Management support and education needs related to CKD, COPD, HTN  Nurse Case Manager Clinical Goal(s):  [name] Over the next 30 days, patient will verbalize understanding of plan for diagnosis and treatment of lower extremity edema  CCM RN CM Interventions:  08/23/19 call completed with patient   Evaluation of current treatment plan related to lower extremity Edema and patient's adherence to plan as established by provider. Advised patient to keep Dr. Baird Cancer well informed of new or worsening edema unresolved with use of diuretics taken as prescribed; advised patient to report any symptoms of shortness of breath and or edema noted to face, hands and or abdomen, cough and or chest pain and or shortness of breath when lying down Reviewed medications with patient and discussed indication, dosage and frequency of newly  added fluid pill, Furosemide and patient reports adherence  Discussed plans with patient for ongoing care management follow up and provided patient with direct contact information for care management team  Patient Self Care Activities:  . Self administers medications as prescribed . Attends all scheduled provider appointments . Calls pharmacy for medication refills . Attends church  or other social activities . Performs ADL's independently . Performs IADL's independently . Calls provider office for new concerns or questions   Please see past updates related to this goal by clicking on the "Past Updates" button in the selected goal      . "I need help at home but don't think I can qualify" (pt-stated)   Not on track    Current Barriers:  [name] Knowledge Deficits related to eligibility for VA benefits to assist with caregiver/custodial care . Lacks caregiver support.  . Chronic Disease Management support and education needs related to CKD, HTN, COPD, Impaired Physical Mobility and Poor Gait . Self Care Deficit  Nurse Case Manager Clinical Goal(s):  [name] Over the next 30 days, patient will work with the [employer] office to address needs related to [place] benefits including caregiver and or custodial care assistance  Goal Not Met  . New - 08/23/19 - Over the next 90 days, patient will maintain his ability to perform Self Care as evidence by patient will be able to continue completing his ADL's/IADL's, self-administering his medications and independently performing all other aspects of personal care without difficulty  CCM RN CM Interventions:  08/23/19 call completed with patient   . Evaluation of current treatment plan related to verification of VA benefits and patient's adherence to plan as established by provider. . Reiterated to patient the option to contact the [employer] office; discussed the [place] personnel in this office can help determine what benefits Mr. Fortin may be eligible for and will help provide him with the details/application needed in order to apply for benefits; discussed this personnel can also help him complete any VA applications over the phone if needed; provided patient with the contact person to speak with, Ricki Miller [phone] - Mr. Mcbean recorded this information and plans on calling this agency tomorrow   . Discussed patient will consider in the future; his granddaughter is also available to assist him if needed  . Discussed plans with patient for ongoing care management follow up and provided patient with direct contact information for care management team  Patient Self Care Activities:  . Self administers medications as prescribed . Attends all scheduled provider appointments . Calls pharmacy for medication refills . Performs ADL's independently . Performs IADL's independently . Calls provider office for new concerns or questions  Please see past updates related to this goal by clicking on the "Past Updates" button in the selected goal      . "I think I need PT again" (pt-stated)   On track    Current Barriers:  [name] Knowledge Deficits related to treatment management of impaired physical mobility and poor gait/balance . Chronic Disease Management support and education needs related to CKD, HTN, COPD, Impaired Physical Mobility and Poor gait  Nurse Case Manager Clinical Goal(s):  [name] Over the next 30 days, patient will verbalize understanding of plan for in home PT/OT  Goal Met   CCM RN CM Interventions:  08/23/19 completed with patient   . Evaluation of current treatment plan related to impaired physical mobility and poor gait/balance and patient's adherence to plan as established  by provider. . Advised patient to follow the HEP as recommended and directed by in home PT; discussed patient is receiving weekly in home PT through [facility]; patient feels he is getting stronger and his balance has improved since resuming PT . Discussed plans with patient for ongoing care management follow up and provided patient with direct contact information for care management team . Assessed for recent falls, patient reports he had a fall in his home about 2 weeks ago and landed on his "tail bone", he reports having some soreness but states this is improving, he did not report this fall to Dr.  Baird Cancer; patient is using his walker as needed but continues to hold onto furniture as needed within his apartment; encouraged patient to wear good supportive shoes even when inside his home (he was only wearing socks in his kitchen when he had the fall); encouraged patient to use his walker when outside his home and to avoid walking on uneven ground; encouraged patient to keep floors clutter free and his rooms well lit . Encouraged patient to notify the CCM team and or Dr. Baird Cancer of any/all falls   Patient Self Care Activities:  . Self administers medications as prescribed . Attends all scheduled provider appointments . Calls pharmacy for medication refills . Attends church or other social activities . Performs ADL's independently . Performs IADL's independently . Calls provider office for new concerns or questions  Please see past updates related to this goal by clicking on the "Past Updates" button in the selected goal         Telephone follow up appointment with care management team member scheduled for: 10/08/19  [name], RN, BSN, CCM Care Management Coordinator [place] [facility]  Direct Phone: [phone]

## 2019-08-24 NOTE — Patient Instructions (Signed)
Visit Information  Goals Addressed      Patient Stated   . COMPLETED: "Dr. Baird Cancer started me on a fluid pill" (pt-stated)       Current Barriers:  [name] Knowledge Deficits related to diagnosis and treatment of lower extremity edema . Chronic Disease Management support and education needs related to CKD, COPD, HTN  Nurse Case Manager Clinical Goal(s):  [name] Over the next 30 days, patient will verbalize understanding of plan for diagnosis and treatment of lower extremity edema  CCM RN CM Interventions:  08/23/19 call completed with patient   Evaluation of current treatment plan related to lower extremity Edema and patient's adherence to plan as established by provider. Advised patient to keep Dr. Baird Cancer well informed of new or worsening edema unresolved with use of diuretics taken as prescribed; advised patient to report any symptoms of shortness of breath and or edema noted to face, hands and or abdomen, cough and or chest pain and or shortness of breath when lying down Reviewed medications with patient and discussed indication, dosage and frequency of newly added fluid pill, Furosemide and patient reports adherence  Discussed plans with patient for ongoing care management follow up and provided patient with direct contact information for care management team  Patient Self Care Activities:  . Self administers medications as prescribed . Attends all scheduled provider appointments . Calls pharmacy for medication refills . Attends church or other social activities . Performs ADL's independently . Performs IADL's independently . Calls provider office for new concerns or questions   Please see past updates related to this goal by clicking on the "Past Updates" button in the selected goal      . "I need help at home but don't think I can qualify" (pt-stated)   Not on track    Current Barriers:  [name] Knowledge Deficits related to eligibility for VA benefits to assist with caregiver/custodial  care . Lacks caregiver support.  . Chronic Disease Management support and education needs related to CKD, HTN, COPD, Impaired Physical Mobility and Poor Gait . Self Care Deficit  Nurse Case Manager Clinical Goal(s):  [name] Over the next 30 days, patient will work with the [employer] office to address needs related to [place] benefits including caregiver and or custodial care assistance  Goal Not Met  . New - 08/23/19 - Over the next 90 days, patient will maintain his ability to perform Self Care as evidence by patient will be able to continue completing his ADL's/IADL's, self-administering his medications and independently performing all other aspects of personal care without difficulty  CCM RN CM Interventions:  08/23/19 call completed with patient   . Evaluation of current treatment plan related to verification of VA benefits and patient's adherence to plan as established by provider. . Reiterated to patient the option to contact the [employer] office; discussed the [place] personnel in this office can help determine what benefits Mr. Miyamoto may be eligible for and will help provide him with the details/application needed in order to apply for benefits; discussed this personnel can also help him complete any VA applications over the phone if needed; provided patient with the contact person to speak with, Ricki Miller [phone] - Mr. Dagostino recorded this information and plans on calling this agency tomorrow  . Discussed patient will consider in the future; his granddaughter is also available to assist him if needed  . Discussed plans with patient for ongoing care management follow up and provided patient with direct  contact information for care management team  Patient Self Care Activities:  . Self administers medications as prescribed . Attends all scheduled provider appointments . Calls pharmacy for medication refills . Performs ADL's independently . Performs  IADL's independently . Calls provider office for new concerns or questions  Please see past updates related to this goal by clicking on the "Past Updates" button in the selected goal      . "I think I need PT again" (pt-stated)   On track    Current Barriers:  [name] Knowledge Deficits related to treatment management of impaired physical mobility and poor gait/balance . Chronic Disease Management support and education needs related to CKD, HTN, COPD, Impaired Physical Mobility and Poor gait  Nurse Case Manager Clinical Goal(s):  [name] Over the next 30 days, patient will verbalize understanding of plan for in home PT/OT  Goal Met   CCM RN CM Interventions:  08/23/19 completed with patient   . Evaluation of current treatment plan related to impaired physical mobility and poor gait/balance and patient's adherence to plan as established by provider. . Advised patient to follow the HEP as recommended and directed by in home PT; discussed patient is receiving weekly in home PT through [facility]; patient feels he is getting stronger and his balance has improved since resuming PT . Discussed plans with patient for ongoing care management follow up and provided patient with direct contact information for care management team . Assessed for recent falls, patient reports he had a fall in his home about 2 weeks ago and landed on his "tail bone", he reports having some soreness but states this is improving, he did not report this fall to Dr. Baird Cancer; patient is using his walker as needed but continues to hold onto furniture as needed within his apartment; encouraged patient to wear good supportive shoes even when inside his home (he was only wearing socks in his kitchen when he had the fall); encouraged patient to use his walker when outside his home and to avoid walking on uneven ground; encouraged patient to keep floors clutter free and his rooms well lit . Encouraged patient to notify the CCM team and or  Dr. Baird Cancer of any/all falls   Patient Self Care Activities:  . Self administers medications as prescribed . Attends all scheduled provider appointments . Calls pharmacy for medication refills . Attends church or other social activities . Performs ADL's independently . Performs IADL's independently . Calls provider office for new concerns or questions  Please see past updates related to this goal by clicking on the "Past Updates" button in the selected goal         The patient verbalized understanding of instructions provided today and declined a print copy of patient instruction materials.   Telephone follow up appointment with care management team member scheduled for: 10/08/19  [name], RN, BSN, CCM Care Management Coordinator [place] [facility]  Direct Phone: [phone]

## 2019-08-24 NOTE — Chronic Care Management (AMB) (Signed)
  Chronic Care Management   Outreach Note  08/24/2019 Name: Geoffrey Frank MRN: [id number] DOB: 05-27-1926  Referred by: Glendale Chard, MD Reason for referral : Care Coordination   SW placed a second unsuccessful outbound call to the patient to assist with companion resources. SW left a HIPAA compliant voice message requesting a return call.  Follow Up Plan: The care management team will reach out to the patient again over the next 21 days.   [name], BSW, CDP Social Worker, Certified Dementia Practitioner [place] [facility] [place] [facility] [phone]

## 2019-09-01 ENCOUNTER — Telehealth: Payer: Self-pay

## 2019-09-01 NOTE — Telephone Encounter (Signed)
Returned call to pt. Pt wanted to know if we had the covid vaccine or if we knew where he could go.

## 2019-09-08 ENCOUNTER — Telehealth: Payer: Self-pay

## 2019-09-08 ENCOUNTER — Ambulatory Visit: Payer: Self-pay

## 2019-09-08 DIAGNOSIS — N182 Chronic kidney disease, stage 2 (mild): Secondary | ICD-10-CM

## 2019-09-08 DIAGNOSIS — J449 Chronic obstructive pulmonary disease, unspecified: Secondary | ICD-10-CM

## 2019-09-08 NOTE — Chronic Care Management (AMB) (Signed)
  Chronic Care Management   Outreach Note  09/08/2019 Name: BRAVEN HASHAGEN MRN: [id number] DOB: 06-22-1926  Referred by: Glendale Chard, MD Reason for referral : Care Coordination   SW placed a third and final outbound call to the patient in response to communication received from RN Case Manager requesting assistance in educating the patient on caregiver options. SW left a HIPAA compliant voice message requesting a return call.  Follow Up Plan: Collaboration with RN Case Manager regarding three unsuccessful call attempts. No SW follow up planned at this time.  [name], BSW, CDP Social Worker, Certified Dementia Practitioner [place] [facility] [place] [facility] [phone]

## 2019-09-13 ENCOUNTER — Other Ambulatory Visit: Payer: Self-pay | Admitting: Internal Medicine

## 2019-10-07 ENCOUNTER — Encounter: Payer: Self-pay | Admitting: Internal Medicine

## 2019-10-08 ENCOUNTER — Telehealth: Payer: Self-pay

## 2019-10-08 ENCOUNTER — Ambulatory Visit (INDEPENDENT_AMBULATORY_CARE_PROVIDER_SITE_OTHER): Payer: Medicare PPO

## 2019-10-08 ENCOUNTER — Other Ambulatory Visit: Payer: Self-pay

## 2019-10-08 DIAGNOSIS — J449 Chronic obstructive pulmonary disease, unspecified: Secondary | ICD-10-CM

## 2019-10-08 DIAGNOSIS — N182 Chronic kidney disease, stage 2 (mild): Secondary | ICD-10-CM | POA: Diagnosis not present

## 2019-10-08 DIAGNOSIS — I129 Hypertensive chronic kidney disease with stage 1 through stage 4 chronic kidney disease, or unspecified chronic kidney disease: Secondary | ICD-10-CM

## 2019-10-12 NOTE — Patient Instructions (Signed)
Visit Information  Goals Addressed      Patient Stated   . COMPLETED: "I get lonely sometimes" (pt-stated)       Current Barriers:  [name] Knowledge Deficits related to resources for Companion Care . Lacks caregiver support.   Nurse Case Manager Clinical Goal(s):  [name] Over the next 30 days, patient will work with the CCM team to address needs related to resources for Companion support  Goal Met  CCM RN CM Interventions:  Duplicate Goal, see other goal/interventions related to caregiver resources  Patient Self Care Activities:  . Self administers medications as prescribed . Attends all scheduled provider appointments . Calls pharmacy for medication refills . Performs ADL's independently . Performs IADL's independently . Calls provider office for new concerns or questions  Initial goal documentation    . "I need help at home but don't think I can qualify" (pt-stated)       Current Barriers:  [name] Knowledge Deficits related to eligibility for VA benefits to assist with caregiver/custodial care . Lacks caregiver support.  . Chronic Disease Management support and education needs related to CKD, HTN, COPD, Impaired Physical Mobility and Poor Gait . Self Care Deficit  Nurse Case Manager Clinical Goal(s):  [name] Over the next 30 days, patient will work with the [place] office to address needs related to [place] benefits including caregiver and or custodial care assistance  Goal Not Met  . New - 08/23/19 - Over the next 90 days, patient will maintain his ability to perform Self Care as evidence by patient will be able to continue completing his ADL's/IADL's, self-administering his medications and independently performing all other aspects of personal care without difficulty  CCM RN CM Interventions:  10/08/19 call completed with patient   . Evaluation of current treatment plan related to verification of VA benefits and patient's adherence to plan as established by  provider . Determined patient has not contacted the [place] office; determined patient's granddaughter continues to be supportive and visits him often . Determined patient does not plan to follow up with the [place] Office at this time; discussed patient is content with his current living situation although he admits he still gets lonely living alone . Encouraged patient to reconsider contacting the [place] office; to help determine what benefits Mr. Wyndham may be eligible for and will help provide him with the details/application needed in order to apply for benefits . Discussed plans with patient for ongoing care management follow up and provided patient with direct contact information for care management team  Patient Self Care Activities:  . Self administers medications as prescribed . Attends all scheduled provider appointments . Calls pharmacy for medication refills . Performs ADL's independently . Performs IADL's independently . Calls provider office for new concerns or questions  Please see past updates related to this goal by clicking on the "Past Updates" button in the selected goal      . "I need to sign up for the COVID vaccine" (pt-stated)       Current Barriers:  [name] Knowledge Deficits related to registering and receiving 1st COVID 19 vaccine . 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 Determined patient would like to receive COVID 19 vaccination but is unsure  how to register for a vaccine . Advised patient of his options for scheduling a COVID 19 appointment by calling the [facility], discussed online options; discussed process and designated locations for vaccine admin;  offered to assist patient adding him to the [place] wait list, patient declines at this time  . Determined patient does not wish to stand in long lines in order to receive his vaccine due to being fearful he will become overly tired if having to wait for long periods of time . Provided patient with the information provided by [facility] related to plans to offer the COVID vaccine in the near future . Discussed patient plans to ask his pharmacist at [facility] local [facility] about this at next visit and will also ask his granddaughter to assist with getting him signed up . Discussed plans with patient for ongoing care management follow up and provided patient with direct contact information for care management team  Patient Self Care Activities:  . Self administers medications as prescribed . Attends all scheduled provider appointments . Calls pharmacy for medication refills . Performs ADL's independently . Performs IADL's independently . Calls provider office for new concerns or questions  Initial goal documentation     . COMPLETED: "I think I need PT again" (pt-stated)       Current Barriers:  [name] Knowledge Deficits related to treatment management of impaired physical mobility and poor gait/balance . Chronic Disease Management support and education needs related to CKD, HTN, COPD, Impaired Physical Mobility and Poor gait  Nurse Case Manager Clinical Goal(s):  [name] Over the next 30 days, patient will verbalize understanding of plan for in home PT/OT  Goal Met   CCM RN CM Interventions:  10/08/19 completed with patient   . Evaluation of current treatment plan related to impaired physical mobility and poor gait/balance and patient's adherence to plan as established by provider. . Discussed plans with patient for ongoing care management follow up and provided patient with direct contact information for care management team . Determined patient has completed all PT services; patient  states he "try's to follow his HEP"  . Assessed for falls, patient denies; Assessed for decline in balance and gait, patient denies having a noticed decline with his balance since completing PT . Reinforced importance of wearing good supportive shoes with ambulation and to avoid walking on uneven ground, discussed keeping floors clutter free and hallways well lit to help avoid falls  Patient Self Care Activities:  . Self administers medications as prescribed . Attends all scheduled provider appointments . Calls pharmacy for medication refills . Attends church or other social activities . Performs ADL's independently . Performs IADL's independently . Calls provider office for new concerns or questions  Please see past updates related to this goal by clicking on the "Past Updates" button in the selected goal      . "to keep my BP within normal range" (pt-stated)       Current Barriers:  [name] Knowledge Deficits related to disease process and Self Health Management of HTN . 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 Evaluation of current treatment plan related to HTN and patient's adherence to plan as established by provider. . Advised patient to restrict Sodium as directed  by PCP and to take his prescribed medications exactly as directed without missed doses; discussed target BP <130/80 . Discussed plans with patient for ongoing care management follow up and provided patient with direct contact information for care management team . Advised patient, providing education and rationale, to monitor blood pressure daily and record, calling the CCM RN CM and or PCP for findings outside established parameters.  . Provided patient with printed educational materials related to  Why Should I Restrict Sodium?; Life's Simple 7 ;Choosing Healthy Meals as you get Older  Patient Self Care Activities:  . Self administers medications as prescribed . Attends all scheduled provider appointments . Calls pharmacy for medication refills . Performs ADL's independently . Performs IADL's independently . Calls provider office for new concerns or questions  Initial goal documentation    . "to keep my COPD under good control" (pt-stated)       Current Barriers:  [name] Knowledge Deficits related to disease process and Self health management of COPD . 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 Evaluation of current treatment plan related to COPD and patient's adherence to plan as established by provider. . Provided education to patient re: importance of avoiding breathing in cold air when outdoors to help avoid COPD exacerbation; educated patient on importance of notifying PCP promptly of any signs/symptoms suggestive of COPD exacerbation   . Reviewed medications with patient and discussed patient is not currently prescribed to take any medications for his COPD . Discussed plans with patient for ongoing care management follow up and provided patient with direct contact information for care management team . Provided patient with printed educational materials related to COPD Zone Safety Tool  Patient Self Care Activities:  . Self administers medications as prescribed . Attends all scheduled provider appointments . Calls pharmacy for medication refills . Performs ADL's independently . Performs IADL's independently . Calls provider office for new concerns or questions  Initial goal documentation       The patient verbalized  understanding of instructions provided today and declined a print copy of patient instruction materials.   Telephone follow up appointment with care management team member scheduled for: 11/08/19  [name], RN, BSN, CCM Care Management Coordinator [facility]  Direct Phone: [phone]

## 2019-10-12 NOTE — Chronic Care Management (AMB) (Signed)
Chronic Care Management   Follow Up Note   10/08/2019 Name: Geoffrey Frank MRN: [id number] DOB: 1926/02/01  Referred by: Glendale Chard, MD Reason for referral : Chronic Care Management (CCM RNCM Telephone Follow up )   Geoffrey Frank is a 86 y.o. year old male who is a primary care patient of Glendale Chard, MD. The CCM team was consulted for assistance with chronic disease management and care coordination needs.    Review of patient status, including review of consultants reports, relevant laboratory and other test results, and collaboration with appropriate care team members and the patient's provider was performed as part of comprehensive patient evaluation and provision of chronic care management services.    SDOH (Social Determinants of Health) screening performed today: None. See Care Plan for related entries.   Placed outbound call to patient for a CCM RN CM follow up.   Outpatient Encounter Medications as of 10/08/2019  Medication Sig  . acetaminophen (TYLENOL) 500 MG tablet Take 1,000 mg by mouth daily.   [name] amLODipine-olmesartan (AZOR) 10-40 MG tablet Take 0.5 tablets by mouth daily.  [name] aspirin 81 MG tablet Take 81 mg by mouth daily.  [name] docusate sodium (COLACE) 100 MG capsule Take 100 mg by mouth daily as needed for mild constipation.   . furosemide (LASIX) 20 MG tablet TAKE 1 TABLET BY MOUTH EVERY DAY AS NEEDED  . lidocaine (XYLOCAINE) 5 % ointment Apply 1 application topically as needed. (Patient not taking: Reported on 06/30/2019)  . linaclotide (LINZESS) 145 MCG CAPS capsule Take 145 mcg by mouth daily before breakfast.  . metoprolol tartrate (LOPRESSOR) 25 MG tablet TAKE 1 TABLET BY MOUTH TWICE A DAY (Patient taking differently: Take 25 mg by mouth 2 (two) times daily. )  . nystatin cream (MYCOSTATIN) Apply 1 application topically 2 (two) times daily. Apply to affected area twice a day (Patient not taking: Reported on 06/30/2019)  . polyethylene glycol powder (MIRALAX)  powder Take 17 g by mouth daily as needed for moderate constipation or severe constipation. (Patient taking differently: Take 17 g by mouth daily as needed for mild constipation. )  . potassium chloride (KLOR-CON) 8 MEQ tablet TAKE 1 TABLET BY MOUTH EVERY DAY AS NEEDED (ONLY TAKE WHILE ON FUROSEMIDE)   No facility-administered encounter medications on file as of 10/08/2019.     Objective:  Lab Results  Component Value Date   HGBA1C 5.9 (H) 06/25/2018   HGBA1C 5.9 (H) 09/29/2017   HGBA1C 5.9 (H) 10/22/2016   Lab Results  Component Value Date   CREATININE 1.03 06/30/2019   BP Readings from Last 3 Encounters:  06/30/19 132/76  06/09/19 128/72  04/04/19 (!) 153/88    Goals Addressed      Patient Stated   . COMPLETED: "I get lonely sometimes" (pt-stated)       Current Barriers:  [name] Knowledge Deficits related to resources for Companion Care . Lacks caregiver support.   Nurse Case Manager Clinical Goal(s):  [name] Over the next 30 days, patient will work with the CCM team to address needs related to resources for Companion support  Goal Met  CCM RN CM Interventions:  Duplicate Goal, see other goal/interventions related to caregiver resources  Patient Self Care Activities:  . Self administers medications as prescribed . Attends all scheduled provider appointments . Calls pharmacy for medication refills . Performs ADL's independently . Performs IADL's independently . Calls provider office for new concerns or questions  Initial goal documentation    . "I need  help at home but don't think I can qualify" (pt-stated)       Current Barriers:  [name] Knowledge Deficits related to eligibility for VA benefits to assist with caregiver/custodial care . Lacks caregiver support.  . Chronic Disease Management support and education needs related to CKD, HTN, COPD, Impaired Physical Mobility and Poor Gait . Self Care Deficit  Nurse Case Manager Clinical Goal(s):  [name] Over the next 30 days, patient  will work with the [place] office to address needs related to [place] benefits including caregiver and or custodial care assistance  Goal Not Met  . New - 08/23/19 - Over the next 90 days, patient will maintain his ability to perform Self Care as evidence by patient will be able to continue completing his ADL's/IADL's, self-administering his medications and independently performing all other aspects of personal care without difficulty  CCM RN CM Interventions:  10/08/19 call completed with patient   . Evaluation of current treatment plan related to verification of VA benefits and patient's adherence to plan as established by provider . Determined patient has not contacted the [place] office; determined patient's granddaughter continues to be supportive and visits him often . Determined patient does not plan to follow up with the [place] Office at this time; discussed patient is content with his current living situation although he admits he still gets lonely living alone . Encouraged patient to reconsider contacting the [place] office; to help determine what benefits Mr. Laabs may be eligible for and will help provide him with the details/application needed in order to apply for benefits . Discussed plans with patient for ongoing care management follow up and provided patient with direct contact information for care management team  Patient Self Care Activities:  . Self administers medications as prescribed . Attends all scheduled provider appointments . Calls pharmacy for medication refills . Performs ADL's independently . Performs IADL's independently . Calls provider office for new concerns or questions  Please see past updates related to this goal by clicking on the "Past Updates" button in the selected goal     . "I need to sign up for the COVID vaccine" (pt-stated)       Current Barriers:  [name] Knowledge Deficits  related to registering and receiving 1st COVID 19 vaccine . 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 Determined patient would like to receive COVID 19 vaccination but is unsure how to register for a vaccine . Advised patient of his options for scheduling a COVID 19 appointment by calling the [facility], discussed online options; discussed process and designated locations for vaccine admin; offered to assist patient adding him to the [facility] wait list, patient declines at this time  . Determined patient does not wish to stand in long lines in order to receive his vaccine due to being fearful he will become overly tired if having to wait for long periods of time . Provided patient with the information provided by [facility] related to plans to offer the COVID vaccine in the near future . Discussed patient plans to ask his pharmacist at [facility] local [facility] about this at next visit and will also ask his granddaughter to assist with getting him signed up .  Discussed plans with patient for ongoing care management follow up and provided patient with direct contact information for care management team  Patient Self Care Activities:  . Self administers medications as prescribed . Attends all scheduled provider appointments . Calls pharmacy for medication refills . Performs ADL's independently . Performs IADL's independently . Calls provider office for new concerns or questions  Initial goal documentation    . COMPLETED: "I think I need PT again" (pt-stated)       Current Barriers:  [name] Knowledge Deficits related to treatment management of impaired physical mobility and poor gait/balance . Chronic Disease Management support and  education needs related to CKD, HTN, COPD, Impaired Physical Mobility and Poor gait  Nurse Case Manager Clinical Goal(s):  [name] Over the next 30 days, patient will verbalize understanding of plan for in home PT/OT  Goal Met   CCM RN CM Interventions:  10/08/19 completed with patient   . Evaluation of current treatment plan related to impaired physical mobility and poor gait/balance and patient's adherence to plan as established by provider. . Discussed plans with patient for ongoing care management follow up and provided patient with direct contact information for care management team . Determined patient has completed all PT services; patient states he "try's to follow his HEP"  . Assessed for falls, patient denies; Assessed for decline in balance and gait, patient denies having a noticed decline with his balance since completing PT . Reinforced importance of wearing good supportive shoes with ambulation and to avoid walking on uneven ground, discussed keeping floors clutter free and hallways well lit to help avoid falls  Patient Self Care Activities:  . Self administers medications as prescribed . Attends all scheduled provider appointments . Calls pharmacy for medication refills . Attends church or other social activities . Performs ADL's independently . Performs IADL's independently . Calls provider office for new concerns or questions  Please see past updates related to this goal by clicking on the "Past Updates" button in the selected goal     . "to keep my BP within normal range" (pt-stated)       Current Barriers:  [name] Knowledge Deficits related to disease process and Self Health Management of HTN . 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 Evaluation of  current treatment plan related to HTN and patient's adherence to plan as established by provider. . Advised patient to restrict Sodium as directed by PCP and to take his prescribed medications exactly as directed without missed doses; discussed target BP <130/80 . Discussed plans with patient for ongoing care management follow up and provided patient with direct contact information for care management team . Advised patient, providing education and rationale, to monitor blood pressure daily and record, calling the CCM RN CM and or PCP for findings outside established parameters.  . Provided patient with printed educational materials related to Why Should I Restrict Sodium?; Life's Simple 7 ;Choosing Healthy Meals as you get Older  Patient Self Care Activities:  . Self administers medications as prescribed . Attends all scheduled provider appointments . Calls pharmacy for medication refills . Performs ADL's independently . Performs IADL's independently . Calls provider office for new concerns or questions  Initial goal documentation    . "to keep my  COPD under good control" (pt-stated)       Current Barriers:  [name] Knowledge Deficits related to disease process and Self health management of COPD . 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 Evaluation of current treatment plan related to COPD and patient's adherence to plan as established by provider. . Provided education to patient re: importance of avoiding breathing in cold air when outdoors to help avoid COPD exacerbation; educated patient on importance of notifying PCP promptly of any signs/symptoms suggestive of COPD exacerbation   . Reviewed medications with patient and discussed patient is not  currently prescribed to take any medications for his COPD . Discussed plans with patient for ongoing care management follow up and provided patient with direct contact information for care management team . Provided patient with printed educational materials related to COPD Zone Safety Tool  Patient Self Care Activities:  . Self administers medications as prescribed . Attends all scheduled provider appointments . Calls pharmacy for medication refills . Performs ADL's independently . Performs IADL's independently . Calls provider office for new concerns or questions  Initial goal documentation       Plan:   Telephone follow up appointment with care management team member scheduled for: 11/08/19  [name], RN, BSN, CCM Care Management Coordinator [facility]  Direct Phone: [phone]

## 2019-11-01 ENCOUNTER — Ambulatory Visit: Payer: Medicare Other | Admitting: Internal Medicine

## 2019-11-08 ENCOUNTER — Ambulatory Visit: Payer: Self-pay

## 2019-11-08 ENCOUNTER — Telehealth: Payer: Self-pay

## 2019-11-08 ENCOUNTER — Other Ambulatory Visit: Payer: Self-pay

## 2019-11-08 DIAGNOSIS — N182 Chronic kidney disease, stage 2 (mild): Secondary | ICD-10-CM

## 2019-11-08 DIAGNOSIS — I129 Hypertensive chronic kidney disease with stage 1 through stage 4 chronic kidney disease, or unspecified chronic kidney disease: Secondary | ICD-10-CM

## 2019-11-08 DIAGNOSIS — J449 Chronic obstructive pulmonary disease, unspecified: Secondary | ICD-10-CM

## 2019-11-08 NOTE — Chronic Care Management (AMB) (Addendum)
  Chronic Care Management   Outreach Note  11/08/2019 Name: Geoffrey Frank MRN: [id number] DOB: 1925-11-25  Referred by: Glendale Chard, MD Reason for referral : Chronic Care Management (FU Call - COPD, CKDII, HTN)   An unsuccessful telephone outreach was attempted today. The patient was referred to the case management team for assistance with care management and care coordination.   Follow Up Plan: A HIPPA compliant phone message was left for the patient providing contact information and requesting a return call.  Telephone follow up appointment with care management team member scheduled for: 12/03/19  [name], RN, BSN, CCM Care Management Coordinator [place] [facility]  Direct Phone: [phone]

## 2019-11-16 ENCOUNTER — Ambulatory Visit (INDEPENDENT_AMBULATORY_CARE_PROVIDER_SITE_OTHER): Payer: Medicare PPO | Admitting: Internal Medicine

## 2019-11-16 ENCOUNTER — Encounter: Payer: Self-pay | Admitting: Internal Medicine

## 2019-11-16 ENCOUNTER — Other Ambulatory Visit: Payer: Self-pay

## 2019-11-16 VITALS — BP 136/78 | HR 70 | Temp 97.8°F | Ht 72.0 in | Wt 178.2 lb

## 2019-11-16 DIAGNOSIS — I129 Hypertensive chronic kidney disease with stage 1 through stage 4 chronic kidney disease, or unspecified chronic kidney disease: Secondary | ICD-10-CM | POA: Diagnosis not present

## 2019-11-16 DIAGNOSIS — C679 Malignant neoplasm of bladder, unspecified: Secondary | ICD-10-CM

## 2019-11-16 DIAGNOSIS — I471 Supraventricular tachycardia, unspecified: Secondary | ICD-10-CM

## 2019-11-16 DIAGNOSIS — N182 Chronic kidney disease, stage 2 (mild): Secondary | ICD-10-CM

## 2019-11-16 DIAGNOSIS — J449 Chronic obstructive pulmonary disease, unspecified: Secondary | ICD-10-CM | POA: Diagnosis not present

## 2019-11-16 DIAGNOSIS — M65341 Trigger finger, right ring finger: Secondary | ICD-10-CM | POA: Diagnosis not present

## 2019-11-16 MED ORDER — METOPROLOL TARTRATE 25 MG PO TABS: 25.0000 mg | ORAL_TABLET | Freq: Two times a day (BID) | ORAL | 2 refills | Status: DC

## 2019-11-16 MED ORDER — AMLODIPINE-OLMESARTAN 10-40 MG PO TABS: 0.5000 | ORAL_TABLET | Freq: Every day | ORAL | 1 refills | Status: DC

## 2019-11-16 MED ORDER — FUROSEMIDE 20 MG PO TABS: 20.0000 mg | ORAL_TABLET | Freq: Every day | ORAL | 1 refills | Status: DC | PRN
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
## 2019-11-19 ENCOUNTER — Ambulatory Visit (INDEPENDENT_AMBULATORY_CARE_PROVIDER_SITE_OTHER): Payer: Medicare PPO

## 2019-11-19 DIAGNOSIS — J449 Chronic obstructive pulmonary disease, unspecified: Secondary | ICD-10-CM

## 2019-11-19 DIAGNOSIS — I129 Hypertensive chronic kidney disease with stage 1 through stage 4 chronic kidney disease, or unspecified chronic kidney disease: Secondary | ICD-10-CM | POA: Diagnosis not present

## 2019-11-19 DIAGNOSIS — R2689 Other abnormalities of gait and mobility: Secondary | ICD-10-CM

## 2019-11-19 DIAGNOSIS — I10 Essential (primary) hypertension: Secondary | ICD-10-CM | POA: Diagnosis not present

## 2019-11-19 DIAGNOSIS — N182 Chronic kidney disease, stage 2 (mild): Secondary | ICD-10-CM | POA: Diagnosis not present

## 2019-11-19 NOTE — Chronic Care Management (AMB) (Signed)
Chronic Care Management    Social Work Follow Up Note  11/19/2019 Name: Geoffrey Frank MRN: [id number] DOB: Dec 30, 1925  Geoffrey Frank is a 86 y.o. year old male who is a primary care patient of Glendale Chard, MD. The CCM team was consulted for assistance with care coordination.   Review of patient status, including review of consultants reports, other relevant assessments, and collaboration with appropriate care team members and the patient's provider was performed as part of comprehensive patient evaluation and provision of chronic care management services.    SDOH (Social Determinants of Health) assessments performed: No    Outpatient Encounter Medications as of 11/19/2019  Medication Sig  . acetaminophen (TYLENOL) 500 MG tablet Take 1,000 mg by mouth daily.   [name] amLODipine-olmesartan (AZOR) 10-40 MG tablet Take 0.5 tablets by mouth daily.  [name] aspirin 81 MG tablet Take 81 mg by mouth daily.  [name] docusate sodium (COLACE) 100 MG capsule Take 100 mg by mouth daily as needed for mild constipation.   . furosemide (LASIX) 20 MG tablet Take 1 tablet (20 mg total) by mouth daily as needed.  . lidocaine (XYLOCAINE) 5 % ointment Apply 1 application topically as needed. (Patient not taking: Reported on 06/30/2019)  . linaclotide (LINZESS) 145 MCG CAPS capsule Take 145 mcg by mouth daily before breakfast.  . metoprolol tartrate (LOPRESSOR) 25 MG tablet Take 1 tablet (25 mg total) by mouth 2 (two) times daily.  [name] nystatin cream (MYCOSTATIN) Apply 1 application topically 2 (two) times daily. Apply to affected area twice a day (Patient not taking: Reported on 06/30/2019)  . polyethylene glycol powder (MIRALAX) powder Take 17 g by mouth daily as needed for moderate constipation or severe constipation. (Patient not taking: Reported on 11/16/2019)  . potassium chloride (KLOR-CON) 8 MEQ tablet TAKE 1 TABLET BY MOUTH EVERY DAY AS NEEDED (ONLY TAKE WHILE ON FUROSEMIDE)   No facility-administered encounter  medications on file as of 11/19/2019.     Goals Addressed            This Visit's Progress   . Collaborate with RN Case Manager to assist with patient care coordination needs by assisting with long-term care planning       CARE PLAN ENTRY (see longtitudinal plan of care for additional care plan information)  Current Barriers:  . Financial constraints related to cost of long term care . Limited social support . Level of care concerns . ADL IADL limitations due to chronic COPD  Social Work Clinical Goal(s):  [name] Over the next 120 days the patient will work with SW to identify a long-term plan for patient living arrangement  CCM SW Interventions: Completed 11/19/19 . Received communication from the patients primary physician indicating the patient is interested in moving to a retirement community . Successful outbound call placed to the patient to assess level of care need o The patient reports needing assistance with medication management  o The patient is a high fall risk . Educated the patient on the difference in ALF and IL  o Discussed plan to pursue ALF at this time . Determined the patient monthly budget to be around 1200 per month . Advised the patient SW would research available communities in the area within price point and touch base with him over the next week  Patient Self Care Activities:  . Patient verbalizes understanding of plan to work with care management team to transition to an ALF . Attends all scheduled provider appointments . Calls pharmacy for medication  refills . Calls provider office for new concerns or questions  Initial goal documentation         Follow Up Plan: SW will follow up with patient by phone over the next week.   [name], BSW, CDP Social Worker, Certified Dementia Practitioner [place] [facility] [place] [facility] [phone]  Total time spent performing care coordination and/or care management activities with the patient by phone or  face to face = 15 minutes.

## 2019-11-19 NOTE — Patient Instructions (Signed)
Social Worker Visit Information  Goals we discussed today:  Goals Addressed            This Visit's Progress   . Collaborate with RN Case Manager to assist with patient care coordination needs by assisting with long-term care planning       CARE PLAN ENTRY (see longtitudinal plan of care for additional care plan information)  Current Barriers:  . Financial constraints related to cost of long term care . Limited social support . Level of care concerns . ADL IADL limitations due to chronic COPD  Social Work Clinical Goal(s):  [name] Over the next 120 days the patient will work with SW to identify a long-term plan for patient living arrangement  CCM SW Interventions: Completed 11/19/19 . Received communication from the patients primary physician indicating the patient is interested in moving to a retirement community . Successful outbound call placed to the patient to assess level of care need o The patient reports needing assistance with medication management  o The patient is a high fall risk . Educated the patient on the difference in ALF and IL  o Discussed plan to pursue ALF at this time . Determined the patient monthly budget to be around 1200 per month . Advised the patient SW would research available communities in the area within price point and touch base with him over the next week  Patient Self Care Activities:  . Patient verbalizes understanding of plan to work with care management team to transition to an ALF . Attends all scheduled provider appointments . Calls pharmacy for medication refills . Calls provider office for new concerns or questions  Initial goal documentation         Follow Up Plan: SW will follow up with patient by phone over the next week  [name], BSW, CDP Social Worker, Certified Dementia Practitioner [place] [facility] [place] [facility] [phone]

## 2019-11-23 ENCOUNTER — Ambulatory Visit: Payer: Self-pay

## 2019-11-23 DIAGNOSIS — J449 Chronic obstructive pulmonary disease, unspecified: Secondary | ICD-10-CM

## 2019-11-23 DIAGNOSIS — R2689 Other abnormalities of gait and mobility: Secondary | ICD-10-CM

## 2019-11-23 NOTE — Chronic Care Management (AMB) (Signed)
  Chronic Care Management   Outreach Note  11/23/2019 Name: Geoffrey Frank MRN: [id number] DOB: [birth year]  Referred by: Glendale Chard, MD Reason for referral : Care Coordination   SW placed an unsuccessful outbound call to the patient to discuss long-term care placement options including applying for special assistance Medicaid. SW left a HIPAA compliant voice message requesting a return call.  Follow Up Plan: The care management team will reach out to the patient again over the next 10 days.   [name], BSW, CDP Social Worker, Certified Dementia Practitioner [place] [facility] [place] [facility] [phone]

## 2019-11-24 ENCOUNTER — Ambulatory Visit: Payer: Self-pay

## 2019-11-24 DIAGNOSIS — J449 Chronic obstructive pulmonary disease, unspecified: Secondary | ICD-10-CM

## 2019-11-24 DIAGNOSIS — R2689 Other abnormalities of gait and mobility: Secondary | ICD-10-CM

## 2019-11-24 DIAGNOSIS — I129 Hypertensive chronic kidney disease with stage 1 through stage 4 chronic kidney disease, or unspecified chronic kidney disease: Secondary | ICD-10-CM

## 2019-11-24 NOTE — Chronic Care Management (AMB) (Signed)
Chronic Care Management    Social Work Follow Up Note  11/24/2019 Name: Geoffrey Frank MRN: [id number] DOB: August 21, 1926  Geoffrey Frank is a 86 y.o. year old male who is a primary care patient of Glendale Chard, MD. The CCM team was consulted for assistance with care coordination.   Review of patient status, including review of consultants reports, other relevant assessments, and collaboration with appropriate care team members and the patient's provider was performed as part of comprehensive patient evaluation and provision of chronic care management services.    SDOH (Social Determinants of Health) assessments performed: No    Outpatient Encounter Medications as of 11/24/2019  Medication Sig  . acetaminophen (TYLENOL) 500 MG tablet Take 1,000 mg by mouth daily.   [name] amLODipine-olmesartan (AZOR) 10-40 MG tablet Take 0.5 tablets by mouth daily.  [name] aspirin 81 MG tablet Take 81 mg by mouth daily.  [name] docusate sodium (COLACE) 100 MG capsule Take 100 mg by mouth daily as needed for mild constipation.   . furosemide (LASIX) 20 MG tablet Take 1 tablet (20 mg total) by mouth daily as needed.  . lidocaine (XYLOCAINE) 5 % ointment Apply 1 application topically as needed. (Patient not taking: Reported on 06/30/2019)  . linaclotide (LINZESS) 145 MCG CAPS capsule Take 145 mcg by mouth daily before breakfast.  . metoprolol tartrate (LOPRESSOR) 25 MG tablet Take 1 tablet (25 mg total) by mouth 2 (two) times daily.  [name] nystatin cream (MYCOSTATIN) Apply 1 application topically 2 (two) times daily. Apply to affected area twice a day (Patient not taking: Reported on 06/30/2019)  . polyethylene glycol powder (MIRALAX) powder Take 17 g by mouth daily as needed for moderate constipation or severe constipation. (Patient not taking: Reported on 11/16/2019)  . potassium chloride (KLOR-CON) 8 MEQ tablet TAKE 1 TABLET BY MOUTH EVERY DAY AS NEEDED (ONLY TAKE WHILE ON FUROSEMIDE)   No facility-administered encounter  medications on file as of 11/24/2019.     Goals Addressed            This Visit's Progress   . Collaborate with RN Case Manager to assist with patient care coordination needs by assisting with long-term care planning       CARE PLAN ENTRY (see longtitudinal plan of care for additional care plan information)  Current Barriers:  . Financial constraints related to cost of long term care . Limited social support . Level of care concerns . ADL IADL limitations due to chronic COPD  Social Work Clinical Goal(s):  [name] Over the next 120 days the patient will work with SW to identify a long-term plan for patient living arrangement  CCM SW Interventions: Completed 11/24/19 . Successful outbound call placed to the patient to review barriers to locating ALF for patients desired monthly cost . Discussed the opportunity to apply for LTC Medicaid to assist with placement o The patient is agreeable to SW mailing application for completion o The patient reports his grand-daughter is "available to help at the drop of a hat" when he calls but "she has her own life too" therefore the patient would like placement to assist with long term needs . Mailed Medicaid application to the patients home address  Patient Self Care Activities:  . Patient verbalizes understanding of plan to work with care management team to transition to an ALF . Attends all scheduled provider appointments . Calls pharmacy for medication refills . Calls provider office for new concerns or questions  Please see past updates related to this  goal by clicking on the "Past Updates" button in the selected goal          Follow Up Plan: SW will follow up with patient by phone over the next three weeks.   [name], BSW, CDP Social Worker, Certified Dementia Practitioner [place] [facility] [place] [facility] [phone]  Total time spent performing care coordination and/or care management activities with the patient by phone or face to  face = 8 minutes.

## 2019-11-24 NOTE — Patient Instructions (Signed)
Social Worker Visit Information  Goals we discussed today:  Goals Addressed            This Visit's Progress   . Collaborate with RN Case Manager to assist with patient care coordination needs by assisting with long-term care planning       CARE PLAN ENTRY (see longtitudinal plan of care for additional care plan information)  Current Barriers:  . Financial constraints related to cost of long term care . Limited social support . Level of care concerns . ADL IADL limitations due to chronic COPD  Social Work Clinical Goal(s):  [name] Over the next 120 days the patient will work with SW to identify a long-term plan for patient living arrangement  CCM SW Interventions: Completed 11/24/19 . Successful outbound call placed to the patient to review barriers to locating ALF for patients desired monthly cost . Discussed the opportunity to apply for LTC Medicaid to assist with placement o The patient is agreeable to SW mailing application for completion o The patient reports his grand-daughter is "available to help at the drop of a hat" when he calls but "she has her own life too" therefore the patient would like placement to assist with long term needs . Mailed Medicaid application to the patients home address  Patient Self Care Activities:  . Patient verbalizes understanding of plan to work with care management team to transition to an ALF . Attends all scheduled provider appointments . Calls pharmacy for medication refills . Calls provider office for new concerns or questions  Please see past updates related to this goal by clicking on the "Past Updates" button in the selected goal          Materials Provided: Yes: mailed Medicaid application to the paitents home  Follow Up Plan: SW will follow up with patient by phone over the next three weeks.   [name], BSW, CDP Social Worker, Certified Dementia Practitioner [place] [facility] [place] [facility] [phone]

## 2019-12-01 DIAGNOSIS — C678 Malignant neoplasm of overlapping sites of bladder: Secondary | ICD-10-CM | POA: Diagnosis not present

## 2019-12-01 DIAGNOSIS — R310 Gross hematuria: Secondary | ICD-10-CM | POA: Diagnosis not present

## 2019-12-03 ENCOUNTER — Telehealth: Payer: Self-pay

## 2019-12-08 ENCOUNTER — Telehealth: Payer: Self-pay

## 2019-12-08 ENCOUNTER — Ambulatory Visit: Payer: Self-pay

## 2019-12-08 DIAGNOSIS — I10 Essential (primary) hypertension: Secondary | ICD-10-CM

## 2019-12-08 DIAGNOSIS — J449 Chronic obstructive pulmonary disease, unspecified: Secondary | ICD-10-CM

## 2019-12-08 DIAGNOSIS — I129 Hypertensive chronic kidney disease with stage 1 through stage 4 chronic kidney disease, or unspecified chronic kidney disease: Secondary | ICD-10-CM

## 2019-12-08 DIAGNOSIS — N182 Chronic kidney disease, stage 2 (mild): Secondary | ICD-10-CM | POA: Diagnosis not present

## 2019-12-08 NOTE — Chronic Care Management (AMB) (Signed)
Chronic Care Management    Social Work Follow Up Note  12/08/2019 Name: Geoffrey Frank MRN: [id number] DOB: 1926/01/16  Geoffrey Frank is a 86 y.o. year old male who is a primary care patient of Glendale Chard, MD. The CCM team was consulted for assistance with care coordination.   Review of patient status, including review of consultants reports, other relevant assessments, and collaboration with appropriate care team members and the patient's provider was performed as part of comprehensive patient evaluation and provision of chronic care management services.    SDOH (Social Determinants of Health) assessments performed: No    Outpatient Encounter Medications as of 12/08/2019  Medication Sig  . acetaminophen (TYLENOL) 500 MG tablet Take 1,000 mg by mouth daily.   [name] amLODipine-olmesartan (AZOR) 10-40 MG tablet Take 0.5 tablets by mouth daily.  [name] aspirin 81 MG tablet Take 81 mg by mouth daily.  [name] docusate sodium (COLACE) 100 MG capsule Take 100 mg by mouth daily as needed for mild constipation.   . furosemide (LASIX) 20 MG tablet Take 1 tablet (20 mg total) by mouth daily as needed.  . lidocaine (XYLOCAINE) 5 % ointment Apply 1 application topically as needed. (Patient not taking: Reported on 06/30/2019)  . linaclotide (LINZESS) 145 MCG CAPS capsule Take 145 mcg by mouth daily before breakfast.  . metoprolol tartrate (LOPRESSOR) 25 MG tablet Take 1 tablet (25 mg total) by mouth 2 (two) times daily.  [name] nystatin cream (MYCOSTATIN) Apply 1 application topically 2 (two) times daily. Apply to affected area twice a day (Patient not taking: Reported on 06/30/2019)  . polyethylene glycol powder (MIRALAX) powder Take 17 g by mouth daily as needed for moderate constipation or severe constipation. (Patient not taking: Reported on 11/16/2019)  . potassium chloride (KLOR-CON) 8 MEQ tablet TAKE 1 TABLET BY MOUTH EVERY DAY AS NEEDED (ONLY TAKE WHILE ON FUROSEMIDE)   No facility-administered encounter  medications on file as of 12/08/2019.     Goals Addressed            This Visit's Progress   . Collaborate with RN Case Manager to assist with patient care coordination needs by assisting with long-term care planning       CARE PLAN ENTRY (see longtitudinal plan of care for additional care plan information)  Current Barriers:  . Financial constraints related to cost of long term care . Limited social support . Level of care concerns . ADL IADL limitations due to chronic COPD  Social Work Clinical Goal(s):  [name] Over the next 120 days the patient will work with SW to identify a long-term plan for patient living arrangement  CCM SW Interventions: Completed 12/08/19 . Successful outbound call placed to the patient to confirm receipt of mailed Medicaid application for LTC assistance o The patient reports he has received the application but has yet to complete o Verbally reviewed steps for completion and where to submit the document . Discussed patients plans to also visit a home close by that his grand-daughter knows of where a retired Therapist, sports assists with 4 occupants and their daily needs o Determined the patient is unclear if there is an opening but is interested in exploring this option . Advised the patient to contact SW as needed prior to next scheduled call . Scheduled follow up call to the patient over the next month  Patient Self Care Activities:  . Patient verbalizes understanding of plan to work with care management team to transition to an ALF . Attends all  scheduled provider appointments . Calls pharmacy for medication refills . Calls provider office for new concerns or questions  Please see past updates related to this goal by clicking on the "Past Updates" button in the selected goal          Follow Up Plan: SW will follow up with patient by phone over the next month   [name], BSW, CDP Social Worker, Certified Dementia Practitioner [place] [facility] [place]  [facility] [phone]  Total time spent performing care coordination and/or care management activities with the patient by phone or face to face = 18 minutes.

## 2019-12-08 NOTE — Telephone Encounter (Signed)
The pt was told that Dr. Baird Cancer wanted to know if he has a scale at home and has he been taking his water pill. The patient said he takes a water pill sometimes but that he hadn't had it in a couple days because it makes him go to the bathroom and keeps him up at night but that he will start it back today and yes he has a scale at home.

## 2019-12-08 NOTE — Telephone Encounter (Signed)
I was calling the because Dr. Baird Cancer wanted to know if the pt has a scale at home.

## 2019-12-08 NOTE — Patient Instructions (Signed)
Social Worker Visit Information  Goals we discussed today:  Goals Addressed            This Visit's Progress   . Collaborate with RN Case Manager to assist with patient care coordination needs by assisting with long-term care planning       CARE PLAN ENTRY (see longtitudinal plan of care for additional care plan information)  Current Barriers:  . Financial constraints related to cost of long term care . Limited social support . Level of care concerns . ADL IADL limitations due to chronic COPD  Social Work Clinical Goal(s):  [name] Over the next 120 days the patient will work with SW to identify a long-term plan for patient living arrangement  CCM SW Interventions: Completed 12/08/19 . Successful outbound call placed to the patient to confirm receipt of mailed Medicaid application for LTC assistance o The patient reports he has received the application but has yet to complete o Verbally reviewed steps for completion and where to submit the document . Discussed patients plans to also visit a home close by that his grand-daughter knows of where a retired Therapist, sports assists with 4 occupants and their daily needs o Determined the patient is unclear if there is an opening but is interested in exploring this option . Advised the patient to contact SW as needed prior to next scheduled call . Scheduled follow up call to the patient over the next month  Patient Self Care Activities:  . Patient verbalizes understanding of plan to work with care management team to transition to an ALF . Attends all scheduled provider appointments . Calls pharmacy for medication refills . Calls provider office for new concerns or questions  Please see past updates related to this goal by clicking on the "Past Updates" button in the selected goal          Materials Provided: Verbal education about LTC Medicaid provided by phone  Follow Up Plan: SW will follow up with patient by phone over the next month  [name], BSW, CDP Social Worker, Certified Dementia Practitioner [place] [facility] [phone]

## 2019-12-27 DIAGNOSIS — Z8551 Personal history of malignant neoplasm of bladder: Secondary | ICD-10-CM | POA: Diagnosis not present

## 2019-12-27 DIAGNOSIS — N529 Male erectile dysfunction, unspecified: Secondary | ICD-10-CM | POA: Diagnosis not present

## 2019-12-27 DIAGNOSIS — I10 Essential (primary) hypertension: Secondary | ICD-10-CM | POA: Diagnosis not present

## 2019-12-27 DIAGNOSIS — Z96649 Presence of unspecified artificial hip joint: Secondary | ICD-10-CM | POA: Diagnosis not present

## 2019-12-27 DIAGNOSIS — F0390 Unspecified dementia without behavioral disturbance: Secondary | ICD-10-CM | POA: Diagnosis not present

## 2019-12-27 DIAGNOSIS — Z87891 Personal history of nicotine dependence: Secondary | ICD-10-CM | POA: Diagnosis not present

## 2019-12-28 ENCOUNTER — Other Ambulatory Visit: Payer: Self-pay

## 2019-12-28 ENCOUNTER — Ambulatory Visit
Admission: RE | Admit: 2019-12-28 | Discharge: 2019-12-28 | Disposition: A | Discharge status: Home / Self Care | Payer: Medicare PPO | Source: Ambulatory Visit | Attending: Nurse Practitioner | Admitting: Nurse Practitioner

## 2019-12-28 ENCOUNTER — Other Ambulatory Visit: Payer: Self-pay | Admitting: Nurse Practitioner

## 2019-12-28 DIAGNOSIS — M79651 Pain in right thigh: Secondary | ICD-10-CM | POA: Diagnosis not present

## 2019-12-28 DIAGNOSIS — I83009 Varicose veins of unspecified lower extremity with ulcer of unspecified site: Secondary | ICD-10-CM | POA: Diagnosis not present

## 2019-12-28 DIAGNOSIS — M25551 Pain in right hip: Secondary | ICD-10-CM | POA: Diagnosis not present

## 2019-12-28 DIAGNOSIS — S79921A Unspecified injury of right thigh, initial encounter: Secondary | ICD-10-CM | POA: Diagnosis not present

## 2019-12-30 ENCOUNTER — Telehealth: Payer: Self-pay

## 2019-12-30 NOTE — Telephone Encounter (Signed)
Xray results given to patient

## 2020-01-04 ENCOUNTER — Ambulatory Visit: Payer: Medicare Other

## 2020-01-04 ENCOUNTER — Ambulatory Visit (INDEPENDENT_AMBULATORY_CARE_PROVIDER_SITE_OTHER): Payer: Medicare PPO

## 2020-01-04 ENCOUNTER — Encounter: Payer: Medicare Other | Admitting: Internal Medicine

## 2020-01-04 DIAGNOSIS — J449 Chronic obstructive pulmonary disease, unspecified: Secondary | ICD-10-CM

## 2020-01-04 DIAGNOSIS — I10 Essential (primary) hypertension: Secondary | ICD-10-CM

## 2020-01-05 ENCOUNTER — Ambulatory Visit: Payer: Self-pay

## 2020-01-05 ENCOUNTER — Encounter: Payer: Medicare PPO | Admitting: Internal Medicine

## 2020-01-05 NOTE — Patient Instructions (Signed)
Social Worker Visit Information  Goals we discussed today:  Goals Addressed            This Visit's Progress   . Collaborate with RN Case Manager to assist with patient care coordination needs by assisting with long-term care planning       CARE PLAN ENTRY (see longtitudinal plan of care for additional care plan information)  Current Barriers:  . Financial constraints related to cost of long term care . Limited social support . Level of care concerns . ADL IADL limitations due to chronic COPD  Social Work Clinical Goal(s):  [name] Over the next 120 days the patient will work with SW to identify a long-term plan for patient living arrangement  CCM SW Interventions: Completed 01/04/2020 . Successful outbound call placed to the patient to assess goal progression . Determined the patient has yet to complete Medicaid LTC application due to feeling overwhelmed with documents to gather in order to submit application . Discussed opportunity to request help from grand-daughter o The patient reports his grand-daughter is very busy with work and he does not want to ask at this time . Provided education to the patient surrounding the 45 day application processing window advising the patient to submit application once questions are completed  o Discussed ability to submit documents to DSS once obtained after the application has been submitted in order to start the 45 day processing period . Determined the patient has an appointment Saturday May 1 with accountant to complete taxes o Patient reports plans to request assistance in obtaining documents from his accountant . Encouraged the patient to contact SW as needed . Informed by the patient he is aware he needs assistance "soon" as he has recently fallen out of bed o Patient reports he has been seen by his primary provider and does not have any fractures but is sore o Patient reports obtaining orders for Voltaren Gel but he is not sure it is  helping o Encouraged the patient to contact his primary care provider as needed if pain persists o Collaboration with RN Care Manager regarding recent fall and plans to complete LTC Medicaid application  Patient Self Care Activities:  . Patient verbalizes understanding of plan to work with care management team to transition to an ALF . Attends all scheduled provider appointments . Calls pharmacy for medication refills . Calls provider office for new concerns or questions  Please see past updates related to this goal by clicking on the "Past Updates" button in the selected goal         Follow Up Plan: SW will follow up with patient by phone over the next month   [name], BSW, CDP Social Worker, Certified Dementia Practitioner [facility] [phone]

## 2020-01-05 NOTE — Chronic Care Management (AMB) (Signed)
Chronic Care Management    Social Work Follow Up Note  01/04/2020 Name: Geoffrey Frank MRN: [id number] DOB: February 12, 1926  Geoffrey Frank is a 86 y.o. year old male who is a primary care patient of Glendale Chard, MD. The CCM team was consulted for assistance with care coordination.   Review of patient status, including review of consultants reports, other relevant assessments, and collaboration with appropriate care team members and the patient's provider was performed as part of comprehensive patient evaluation and provision of chronic care management services.    SDOH (Social Determinants of Health) assessments performed: No    Outpatient Encounter Medications as of 01/04/2020  Medication Sig  . acetaminophen (TYLENOL) 500 MG tablet Take 1,000 mg by mouth daily.   [name] amLODipine-olmesartan (AZOR) 10-40 MG tablet Take 0.5 tablets by mouth daily.  [name] aspirin 81 MG tablet Take 81 mg by mouth daily.  [name] docusate sodium (COLACE) 100 MG capsule Take 100 mg by mouth daily as needed for mild constipation.   . furosemide (LASIX) 20 MG tablet Take 1 tablet (20 mg total) by mouth daily as needed.  . lidocaine (XYLOCAINE) 5 % ointment Apply 1 application topically as needed. (Patient not taking: Reported on 06/30/2019)  . linaclotide (LINZESS) 145 MCG CAPS capsule Take 145 mcg by mouth daily before breakfast.  . metoprolol tartrate (LOPRESSOR) 25 MG tablet Take 1 tablet (25 mg total) by mouth 2 (two) times daily.  [name] nystatin cream (MYCOSTATIN) Apply 1 application topically 2 (two) times daily. Apply to affected area twice a day (Patient not taking: Reported on 06/30/2019)  . polyethylene glycol powder (MIRALAX) powder Take 17 g by mouth daily as needed for moderate constipation or severe constipation. (Patient not taking: Reported on 11/16/2019)  . potassium chloride (KLOR-CON) 8 MEQ tablet TAKE 1 TABLET BY MOUTH EVERY DAY AS NEEDED (ONLY TAKE WHILE ON FUROSEMIDE)   No facility-administered encounter  medications on file as of 01/04/2020.     Goals Addressed            This Visit's Progress   . Collaborate with RN Case Manager to assist with patient care coordination needs by assisting with long-term care planning       CARE PLAN ENTRY (see longtitudinal plan of care for additional care plan information)  Current Barriers:  . Financial constraints related to cost of long term care . Limited social support . Level of care concerns . ADL IADL limitations due to chronic COPD  Social Work Clinical Goal(s):  [name] Over the next 120 days the patient will work with SW to identify a long-term plan for patient living arrangement  CCM SW Interventions: Completed 01/04/2020 . Successful outbound call placed to the patient to assess goal progression . Determined the patient has yet to complete Medicaid LTC application due to feeling overwhelmed with documents to gather in order to submit application . Discussed opportunity to request help from grand-daughter o The patient reports his grand-daughter is very busy with work and he does not want to ask at this time . Provided education to the patient surrounding the 45 day application processing window advising the patient to submit application once questions are completed  o Discussed ability to submit documents to DSS once obtained after the application has been submitted in order to start the 45 day processing period . Determined the patient has an appointment Saturday May 1 with accountant to complete taxes o Patient reports plans to request assistance in obtaining documents from his accountant .  Encouraged the patient to contact SW as needed . Informed by the patient he is aware he needs assistance "soon" as he has recently fallen out of bed o Patient reports he has been seen by his primary provider and does not have any fractures but is sore o Patient reports obtaining orders for Voltaren Gel but he is not sure it is helping o Encouraged the  patient to contact his primary care provider as needed if pain persists o Collaboration with RN Care Manager regarding recent fall and plans to complete LTC Medicaid application  Patient Self Care Activities:  . Patient verbalizes understanding of plan to work with care management team to transition to an ALF . Attends all scheduled provider appointments . Calls pharmacy for medication refills . Calls provider office for new concerns or questions  Please see past updates related to this goal by clicking on the "Past Updates" button in the selected goal          Follow Up Plan: SW will follow up with patient by phone over the next month.   [name], BSW, CDP Social Worker, Certified Dementia Practitioner [facility] [phone]  Total time spent performing care coordination and/or care management activities with the patient by phone or face to face = 20 minutes.

## 2020-01-06 ENCOUNTER — Telehealth: Payer: Self-pay

## 2020-01-06 NOTE — Telephone Encounter (Signed)
I called the pt to check on him because he no showed his appointments yesterday and to reschedule his missed appointments, office visit and annual medicare wellness.

## 2020-01-07 ENCOUNTER — Other Ambulatory Visit: Payer: Self-pay

## 2020-01-24 ENCOUNTER — Encounter: Payer: Self-pay | Admitting: Internal Medicine

## 2020-01-24 ENCOUNTER — Ambulatory Visit (INDEPENDENT_AMBULATORY_CARE_PROVIDER_SITE_OTHER): Payer: Medicare PPO | Admitting: Internal Medicine

## 2020-01-24 ENCOUNTER — Other Ambulatory Visit: Payer: Self-pay

## 2020-01-24 VITALS — BP 160/86 | HR 81 | Ht 72.0 in | Wt 178.0 lb

## 2020-01-24 DIAGNOSIS — R600 Localized edema: Secondary | ICD-10-CM | POA: Diagnosis not present

## 2020-01-24 DIAGNOSIS — I5031 Acute diastolic (congestive) heart failure: Secondary | ICD-10-CM | POA: Diagnosis not present

## 2020-01-24 DIAGNOSIS — I10 Essential (primary) hypertension: Secondary | ICD-10-CM

## 2020-01-24 MED ORDER — FUROSEMIDE 40 MG PO TABS: 40.0000 mg | ORAL_TABLET | Freq: Every day | ORAL | 3 refills | Status: DC

## 2020-01-24 MED ORDER — POTASSIUM CHLORIDE ER 20 MEQ PO TBCR: 20.0000 meq | EXTENDED_RELEASE_TABLET | Freq: Every day | ORAL | 3 refills | Status: DC
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
## 2020-01-24 NOTE — Patient Instructions (Signed)
Medication Instructions:  INCREASE lasix to 40mg  daily INCREASE potassium to 58mEq daily  *If you need a refill on your cardiac medications before your next appointment, please call your pharmacy*   Lab Work: NONE If you have labs (blood work) drawn today and your tests are completely normal, you will receive your results only by: [name] MyChart Message (if you have MyChart) OR . A paper copy in the mail If you have any lab test that is abnormal or we need to change your treatment, we will call you to review the results.   Testing/Procedures: NONE   Follow-Up: At [facility], you and your health needs are our priority.  As part of our continuing mission to provide you with exceptional heart care, we have created designated Provider Care Teams.  These Care Teams include your primary Cardiologist (physician) and Advanced Practice Providers (APPs -  Physician Assistants and Nurse Practitioners) who all work together to provide you with the care you need, when you need it.  We recommend signing up for the patient portal called "MyChart".  Sign up information is provided on this After Visit Summary.  MyChart is used to connect with patients for Virtual Visits (Telemedicine).  Patients are able to view lab/test results, encounter notes, upcoming appointments, etc.  Non-urgent messages can be sent to your provider as well.   To learn more about what you can do with MyChart, go to [email or web address].    Your next appointment:   6 month(s)  The format for your next appointment:   In Person  Provider:   You may see Pixie Casino, MD or one of the following Advanced Practice Providers on your designated Care Team:    Almyra Deforest, PA-C  Fabian Sharp, PA-C or   Roby Lofts, [place]    Other Instructions

## 2020-01-25 ENCOUNTER — Ambulatory Visit: Payer: Self-pay

## 2020-01-25 DIAGNOSIS — J449 Chronic obstructive pulmonary disease, unspecified: Secondary | ICD-10-CM

## 2020-01-25 NOTE — Chronic Care Management (AMB) (Signed)
Chronic Care Management    Social Work Follow Up Note  01/25/2020 Name: Geoffrey Frank MRN: [id number] DOB: 07/28/1926  Geoffrey Frank is a 86 y.o. year old male who is a primary care patient of Glendale Chard, MD. The CCM team was consulted for assistance with care coordination.   Review of patient status, including review of consultants reports, other relevant assessments, and collaboration with appropriate care team members and the patient's provider was performed as part of comprehensive patient evaluation and provision of chronic care management services.    SDOH (Social Determinants of Health) assessments performed: No    Outpatient Encounter Medications as of 01/25/2020  Medication Sig  . acetaminophen (TYLENOL) 500 MG tablet Take 1,000 mg by mouth daily.   [name] amLODipine-olmesartan (AZOR) 10-40 MG tablet Take 0.5 tablets by mouth daily.  [name] aspirin 81 MG tablet Take 81 mg by mouth daily.  [name] docusate sodium (COLACE) 100 MG capsule Take 100 mg by mouth daily as needed for mild constipation.   . furosemide (LASIX) 40 MG tablet Take 1 tablet (40 mg total) by mouth daily.  [name] lidocaine (XYLOCAINE) 5 % ointment Apply 1 application topically as needed.  . linaclotide (LINZESS) 145 MCG CAPS capsule Take 145 mcg by mouth daily before breakfast.  . metoprolol tartrate (LOPRESSOR) 25 MG tablet Take 1 tablet (25 mg total) by mouth 2 (two) times daily.  [name] nystatin cream (MYCOSTATIN) Apply 1 application topically 2 (two) times daily. Apply to affected area twice a day  . polyethylene glycol powder (MIRALAX) powder Take 17 g by mouth daily as needed for moderate constipation or severe constipation.  . potassium chloride 20 MEQ TBCR Take 20 mEq by mouth daily.   No facility-administered encounter medications on file as of 01/25/2020.     Goals Addressed            This Visit's Progress   . Collaborate with RN Case Manager to assist with patient care coordination needs by assisting with  long-term care planning       CARE PLAN ENTRY (see longtitudinal plan of care for additional care plan information)  Current Barriers:  . Financial constraints related to cost of long term care . Limited social support . Level of care concerns . ADL IADL limitations due to chronic COPD  Social Work Clinical Goal(s):  [name] Over the next 120 days the patient will work with SW to identify a long-term plan for patient living arrangement . New 01/25/20- Over the next 30 days the patient will follow up with RN Care Manager regarding medication management to better manage chronic health conditions  CCM SW Interventions: Completed 01/25/20 . Successful outbound call placed to the patient to assess goal progression . Determined the patient has completed Medicaid application with plans to submit via mail today . Discussed patient recent Cardiology visit on 01/24/20 o Performed chart review to note new diagnosis of acute diastolic heart failure o Patient Lasix has been increased from 20 mg daily to 40 mg daily with continued orders for ted hose o Assessed for medication compliance. The patient reports "he said I could take it every other day or every two days. I don't like having to go to the bathroom constantly" o Encouraged the patient to take his medication as prescribed . Collaboration with [name], [place] regarding patient care management needs related to medication management and education surrounding acute diastolic heart failure . Scheduled follow up call to the patient over the next 45  days to assess outcome of Medicaid application  Patient Self Care Activities:  . Patient verbalizes understanding of plan to work with care management team to transition to an ALF . Attends all scheduled provider appointments . Calls pharmacy for medication refills . Calls provider office for new concerns or questions . Does not adhere to prescribed medication regimen  Please see past updates  related to this goal by clicking on the "Past Updates" button in the selected goal          Follow Up Plan: SW will follow up with patient by phone over the next 45 days.   [name], BSW, CDP Social Worker, Certified Dementia Practitioner [place] [facility] [place] [facility] [phone]  Total time spent performing care coordination and/or care management activities with the patient by phone or face to face = 14 minutes.

## 2020-01-25 NOTE — Patient Instructions (Signed)
Social Worker Visit Information  Goals we discussed today:  Goals Addressed            This Visit's Progress   . Collaborate with RN Case Manager to assist with patient care coordination needs by assisting with long-term care planning       CARE PLAN ENTRY (see longtitudinal plan of care for additional care plan information)  Current Barriers:  . Financial constraints related to cost of long term care . Limited social support . Level of care concerns . ADL IADL limitations due to chronic COPD  Social Work Clinical Goal(s):  [name] Over the next 120 days the patient will work with SW to identify a long-term plan for patient living arrangement . New 01/25/20- Over the next 30 days the patient will follow up with RN Care Manager regarding medication management to better manage chronic health conditions  CCM SW Interventions: Completed 01/25/20 . Successful outbound call placed to the patient to assess goal progression . Determined the patient has completed Medicaid application with plans to submit via mail today . Discussed patient recent Cardiology visit on 01/24/20 o Performed chart review to note new diagnosis of acute diastolic heart failure o Patient Lasix has been increased from 20 mg daily to 40 mg daily with continued orders for ted hose o Assessed for medication compliance. The patient reports "he said I could take it every other day or every two days. I don't like having to go to the bathroom constantly" o Encouraged the patient to take his medication as prescribed . Collaboration with [name], [place] regarding patient care management needs related to medication management and education surrounding acute diastolic heart failure . Scheduled follow up call to the patient over the next 45 days to assess outcome of Medicaid application  Patient Self Care Activities:  . Patient verbalizes understanding of plan to work with care management team to transition to an  ALF . Attends all scheduled provider appointments . Calls pharmacy for medication refills . Calls provider office for new concerns or questions . Does not adhere to prescribed medication regimen  Please see past updates related to this goal by clicking on the "Past Updates" button in the selected goal          Follow Up Plan: SW will follow up with patient by phone over the next 45 days   [name], BSW, CDP Social Worker, Certified Dementia Practitioner [place] [facility] [place] [facility] [phone]

## 2020-01-27 ENCOUNTER — Telehealth: Payer: Self-pay

## 2020-01-27 ENCOUNTER — Ambulatory Visit (INDEPENDENT_AMBULATORY_CARE_PROVIDER_SITE_OTHER): Payer: Medicare PPO

## 2020-01-27 ENCOUNTER — Other Ambulatory Visit: Payer: Self-pay

## 2020-01-27 DIAGNOSIS — J449 Chronic obstructive pulmonary disease, unspecified: Secondary | ICD-10-CM

## 2020-01-27 DIAGNOSIS — I10 Essential (primary) hypertension: Secondary | ICD-10-CM

## 2020-01-27 DIAGNOSIS — I5032 Chronic diastolic (congestive) heart failure: Secondary | ICD-10-CM

## 2020-01-27 DIAGNOSIS — N182 Chronic kidney disease, stage 2 (mild): Secondary | ICD-10-CM

## 2020-01-28 NOTE — Chronic Care Management (AMB) (Signed)
Chronic Care Management   Follow Up Note   01/28/2020 Name: TRISTIAN RAIRIGH MRN: [id number] DOB: Jun 10, 1926  Referred by: Glendale Chard, MD Reason for referral : Chronic Care Management (FU RNCM Call )   ISAIA CHADBOURNE is a 86 y.o. year old male who is a primary care patient of Glendale Chard, MD. The CCM team was consulted for assistance with chronic disease management and care coordination needs.    Review of patient status, including review of consultants reports, relevant laboratory and other test results, and collaboration with appropriate care team members and the patient's provider was performed as part of comprehensive patient evaluation and provision of chronic care management services.    SDOH (Social Determinants of Health) assessments performed: Yes - No Acute Challenges Identified at this time See Care Plan activities for detailed interventions related to [facility])   Placed outbound follow up call to patient for a CCM RN CM update.     Outpatient Encounter Medications as of 01/27/2020  Medication Sig  . acetaminophen (TYLENOL) 500 MG tablet Take 1,000 mg by mouth daily.   [name] amLODipine-olmesartan (AZOR) 10-40 MG tablet Take 0.5 tablets by mouth daily.  [name] aspirin 81 MG tablet Take 81 mg by mouth daily.  [name] docusate sodium (COLACE) 100 MG capsule Take 100 mg by mouth daily as needed for mild constipation.   . furosemide (LASIX) 40 MG tablet Take 1 tablet (40 mg total) by mouth daily.  [name] lidocaine (XYLOCAINE) 5 % ointment Apply 1 application topically as needed.  . linaclotide (LINZESS) 145 MCG CAPS capsule Take 145 mcg by mouth daily before breakfast.  . metoprolol tartrate (LOPRESSOR) 25 MG tablet Take 1 tablet (25 mg total) by mouth 2 (two) times daily.  [name] nystatin cream (MYCOSTATIN) Apply 1 application topically 2 (two) times daily. Apply to affected area twice a day  . polyethylene glycol powder (MIRALAX) powder Take 17 g by mouth daily as needed for moderate constipation or  severe constipation.  . potassium chloride 20 MEQ TBCR Take 20 mEq by mouth daily.   No facility-administered encounter medications on file as of 01/27/2020.     Objective:  Lab Results  Component Value Date   HGBA1C 5.9 (H) 06/25/2018   HGBA1C 5.9 (H) 09/29/2017   HGBA1C 5.9 (H) 10/22/2016   Lab Results  Component Value Date   CREATININE 1.03 06/30/2019   BP Readings from Last 3 Encounters:  01/24/20 (!) 160/86  11/16/19 136/78  06/30/19 132/76    Goals Addressed      Patient Stated   . "to get my CHF under control" (pt-stated)       [place] (see longitudinal plan of care for additional care plan information)  Current Barriers:  [name] Knowledge Deficits related to disease process and Self Health management of CHF  . Chronic Disease Management support and education needs related to COPD, CKD II, HTN  Nurse Case Manager Clinical Goal(s):  [name] Over the next 30 days, patient will work with the [place] CM and PCP to address needs related to disease education and support to help improve Self Health management of CHF  CCM RN CM Interventions:  05/10/11/19 completed call with patient  . Inter-disciplinary care team collaboration (see longitudinal plan of care) . Evaluation of current treatment plan related to Acute Diastolic Congestive Heart Failure and patient's adherence to plan as established by provider. . Discussed and reviewed patient's follow up visit completed on 01/24/20 with Cardiologist Dr. Debara Pickett with the following  Assessment/Plan discussed:  o PLAN: o 1.   Mr. Bartik probably has some acute diastolic heart failure today.  He has wheezing and decreased breath sounds suggestive of some pulmonary edema.  He also has 2+ bilateral pitting edema.  He is variably compliant with his Lasix.  He may only take it a few times a week and I think it probably benefit from a higher dose to get more efficacy.  Also increase his potassium to 20 mEq.  I have encouraged him to take the Lasix  daily.  He does have follow-up with his primary in about a month. o  Follow-up with me in 6 months or sooner as necessary. . Reviewed medications with patient and discussed the following changes per Dr. Debara Pickett:  o INCREASE lasix to 40mg  daily o INCREASE potassium to 57mEq daily . Determined Mr. Perl has a scale at home but does not weigh himself everyday . Advised patient, providing education and rationale, to weigh daily and record, calling this RNCM and or MD such as Dr. Baird Cancer or Dr. Debara Pickett for weight gain of 3lbs overnight or 5 pounds in a week . Mailed printed educational materials related to CHF Zone Safety Tool  . Discussed plans with patient for ongoing care management follow up and provided patient with direct contact information for care management team  Patient Self Care Activities:  . Patient verbalizes understanding of plan to contact the CCM team and or MD to report new or worsening symptoms of CHF, promptly and or to call 911 if urgent medical care is needed . Self administers medications as prescribed . Attends all scheduled provider appointments . Calls pharmacy for medication refills . Calls provider office for new concerns or questions  Initial goal documentation       Plan:   Telephone follow up appointment with care management team member scheduled for: 02/02/20  [name], RN, BSN, CCM Care Management Coordinator [place] [facility]  Direct Phone: [phone]

## 2020-01-28 NOTE — Patient Instructions (Addendum)
Visit Information  Goals Addressed      Patient Stated   . "to get my CHF under control" (pt-stated)       CARE PLAN ENTRY (see longitudinal plan of care for additional care plan information)  Current Barriers:  [name] Knowledge Deficits related to disease process and Self Health management of CHF  . Chronic Disease Management support and education needs related to COPD, CKD II, HTN  Nurse Case Manager Clinical Goal(s):  [name] Over the next 30 days, patient will work with the [place] CM and PCP to address needs related to disease education and support to help improve Self Health management of CHF  CCM RN CM Interventions:  05/10/11/19 completed call with patient  . Inter-disciplinary care team collaboration (see longitudinal plan of care) . Evaluation of current treatment plan related to Acute Diastolic Congestive Heart Failure and patient's adherence to plan as established by provider. . Discussed and reviewed patient's follow up visit completed on 01/24/20 with Cardiologist Dr. Debara Pickett with the following Assessment/Plan discussed:  o PLAN: o 1.   Geoffrey Frank probably has some acute diastolic heart failure today.  He has wheezing and decreased breath sounds suggestive of some pulmonary edema.  He also has 2+ bilateral pitting edema.  He is variably compliant with his Lasix.  He may only take it a few times a week and I think it probably benefit from a higher dose to get more efficacy.  Also increase his potassium to 20 mEq.  I have encouraged him to take the Lasix daily.  He does have follow-up with his primary in about a month. o  Follow-up with me in 6 months or sooner as necessary. . Reviewed medications with patient and discussed the following changes per Dr. Debara Pickett:  o INCREASE lasix to 40mg  daily o INCREASE potassium to 53mEq daily . Determined [name] has a scale at home but does not weigh himself everyday . Advised patient, providing education and rationale, to weigh daily and record, calling  this RNCM and or MD such as Dr. Baird Cancer or Dr. Debara Pickett for weight gain of 3lbs overnight or 5 pounds in a week . Mailed printed educational materials related to CHF Zone Safety Tool  . Discussed plans with patient for ongoing care management follow up and provided patient with direct contact information for care management team  Patient Self Care Activities:  . Patient verbalizes understanding of plan to contact the CCM team and or MD to report new or worsening symptoms of CHF, promptly and or to call 911 if urgent medical care is needed . Self administers medications as prescribed . Attends all scheduled provider appointments . Calls pharmacy for medication refills . Calls provider office for new concerns or questions  Initial goal documentation       Patient verbalizes understanding of instructions provided today.   Telephone follow up appointment with care management team member scheduled for: 02/02/20  [name], RN, BSN, CCM Care Management Coordinator [place] [facility]  Direct Phone: [phone]

## 2020-01-31 ENCOUNTER — Ambulatory Visit: Payer: Self-pay

## 2020-01-31 DIAGNOSIS — J449 Chronic obstructive pulmonary disease, unspecified: Secondary | ICD-10-CM

## 2020-01-31 DIAGNOSIS — I10 Essential (primary) hypertension: Secondary | ICD-10-CM

## 2020-01-31 NOTE — Chronic Care Management (AMB) (Signed)
  Chronic Care Management   Outreach Note  01/31/2020 Name: Geoffrey Frank MRN: [id number] DOB: [birth year]  Referred by: Glendale Chard, MD Reason for referral : [place] with RN Care Manager who indicates patients desire to receive in home meal delivery. SW reviewed chart to note previous referral was placed to mobile meals in April of 2020. SW outreached [name] with [employer] [facility] [place] to follow up on status of previous referral. See below response:  [name] was assessed on 02/19/2019 and was denied meals on wheels service because he was still driving at the time of the assessment. Meals on Wheels clients must be homebound and unable to prepare their own meals.  The social worker added him to our congregate meal program waitlist at that time.   Follow Up Plan: SW collaborated with Consulting civil engineer regarding above information. SW will follow up with the patient over the next month as previously planned.  [name], BSW, CDP Social Worker, Certified Dementia Practitioner [place] [facility] [place] [facility] [phone]

## 2020-02-02 ENCOUNTER — Ambulatory Visit: Payer: Self-pay

## 2020-02-02 ENCOUNTER — Telehealth: Payer: Self-pay

## 2020-02-02 DIAGNOSIS — J449 Chronic obstructive pulmonary disease, unspecified: Secondary | ICD-10-CM

## 2020-02-02 DIAGNOSIS — N182 Chronic kidney disease, stage 2 (mild): Secondary | ICD-10-CM | POA: Diagnosis not present

## 2020-02-02 DIAGNOSIS — I5032 Chronic diastolic (congestive) heart failure: Secondary | ICD-10-CM

## 2020-02-02 DIAGNOSIS — I10 Essential (primary) hypertension: Secondary | ICD-10-CM

## 2020-02-02 NOTE — Chronic Care Management (AMB) (Signed)
Chronic Care Management    Social Work Follow Up Note  02/02/2020 Name: Geoffrey Frank MRN: [id number] DOB: [birth year]  Geoffrey Frank is a 86 y.o. year old male who is a primary care patient of Glendale Chard, MD. The CCM team was consulted for assistance with care coordination.   Review of patient status, including review of consultants reports, other relevant assessments, and collaboration with appropriate care team members and the patient's provider was performed as part of comprehensive patient evaluation and provision of chronic care management services.    SDOH (Social Determinants of Health) assessments performed: No    Outpatient Encounter Medications as of 02/02/2020  Medication Sig  . acetaminophen (TYLENOL) 500 MG tablet Take 1,000 mg by mouth daily.   [name] amLODipine-olmesartan (AZOR) 10-40 MG tablet Take 0.5 tablets by mouth daily.  [name] aspirin 81 MG tablet Take 81 mg by mouth daily.  [name] docusate sodium (COLACE) 100 MG capsule Take 100 mg by mouth daily as needed for mild constipation.   . furosemide (LASIX) 40 MG tablet Take 1 tablet (40 mg total) by mouth daily.  [name] lidocaine (XYLOCAINE) 5 % ointment Apply 1 application topically as needed.  . linaclotide (LINZESS) 145 MCG CAPS capsule Take 145 mcg by mouth daily before breakfast.  . metoprolol tartrate (LOPRESSOR) 25 MG tablet Take 1 tablet (25 mg total) by mouth 2 (two) times daily.  [name] nystatin cream (MYCOSTATIN) Apply 1 application topically 2 (two) times daily. Apply to affected area twice a day  . polyethylene glycol powder (MIRALAX) powder Take 17 g by mouth daily as needed for moderate constipation or severe constipation.  . potassium chloride 20 MEQ TBCR Take 20 mEq by mouth daily.   No facility-administered encounter medications on file as of 02/02/2020.     Goals Addressed            This Visit's Progress   . Collaborate with RN Case Manager to assist with patient care coordination needs by assisting with  long-term care planning       CARE PLAN ENTRY (see longtitudinal plan of care for additional care plan information)  Current Barriers:  . Financial constraints related to cost of long term care . Limited social support . Level of care concerns . ADL IADL limitations due to chronic COPD  Social Work Clinical Goal(s):  [name] Over the next 120 days the patient will work with SW to identify a long-term plan for patient living arrangement . New 01/25/20- Over the next 30 days the patient will follow up with RN Care Manager regarding medication management to better manage chronic health conditions . New 02/02/20- Over the next 30 days the patient will work with SW to identify resources for prepared meals  CCM SW Interventions: Completed 02/02/20 . Collaboration with RN Care Manager regarding patient need for resources to assist with meal preparation . Patient previously referred to mobile meals in April of 2020 but denied due to the patients ability to drive and leave the home independently o Collaboration with Francene Finders with [place] whom reports the patient was placed on wait list for congregate meals in June of 2020 . Outbound call placed to Annabell Sabal [phone] x [phone]) Nutrition Services Director with [employer] [facility] [place] to inquire patient place on congregate meal wait list o Voice message left requesting a return call  Patient Self Care Activities:  . Patient verbalizes understanding of plan to work with care management team to transition to an ALF .  Attends all scheduled provider appointments . Calls pharmacy for medication refills . Calls provider office for new concerns or questions . Does not adhere to prescribed medication regimen  Please see past updates related to this goal by clicking on the "Past Updates" button in the selected goal          Follow Up Plan: SW will follow up with Annabell Sabal over the next week if no return call is  received.   [name], BSW, CDP Social Worker, Certified Dementia Practitioner [place] [facility] [place] [facility] [phone]  Total time spent performing care coordination and/or care management activities with the patient by phone or face to face = 7 minutes.

## 2020-02-02 NOTE — Chronic Care Management (AMB) (Signed)
Chronic Care Management    Social Work Follow Up Note  02/02/2020 Name: Geoffrey Frank MRN: [id number] DOB: 1926-07-10  Geoffrey Frank is a 86 y.o. year old male who is a primary care patient of Glendale Chard, MD. The CCM team was consulted for assistance with care coordination.   Review of patient status, including review of consultants reports, other relevant assessments, and collaboration with appropriate care team members and the patient's provider was performed as part of comprehensive patient evaluation and provision of chronic care management services.    SDOH (Social Determinants of Health) assessments performed: no.    Outpatient Encounter Medications as of 02/02/2020  Medication Sig  . acetaminophen (TYLENOL) 500 MG tablet Take 1,000 mg by mouth daily.   [name] amLODipine-olmesartan (AZOR) 10-40 MG tablet Take 0.5 tablets by mouth daily.  [name] aspirin 81 MG tablet Take 81 mg by mouth daily.  [name] docusate sodium (COLACE) 100 MG capsule Take 100 mg by mouth daily as needed for mild constipation.   . furosemide (LASIX) 40 MG tablet Take 1 tablet (40 mg total) by mouth daily.  [name] lidocaine (XYLOCAINE) 5 % ointment Apply 1 application topically as needed.  . linaclotide (LINZESS) 145 MCG CAPS capsule Take 145 mcg by mouth daily before breakfast.  . metoprolol tartrate (LOPRESSOR) 25 MG tablet Take 1 tablet (25 mg total) by mouth 2 (two) times daily.  [name] nystatin cream (MYCOSTATIN) Apply 1 application topically 2 (two) times daily. Apply to affected area twice a day  . polyethylene glycol powder (MIRALAX) powder Take 17 g by mouth daily as needed for moderate constipation or severe constipation.  . potassium chloride 20 MEQ TBCR Take 20 mEq by mouth daily.   No facility-administered encounter medications on file as of 02/02/2020.     Goals Addressed            This Visit's Progress   . Collaborate with RN Case Manager to assist with patient care coordination needs by assisting with  long-term care planning       CARE PLAN ENTRY (see longtitudinal plan of care for additional care plan information)  Current Barriers:  . Financial constraints related to cost of long term care . Limited social support . Level of care concerns . ADL IADL limitations due to chronic COPD  Social Work Clinical Goal(s):  [name] Over the next 120 days the patient will work with SW to identify a long-term plan for patient living arrangement . New 01/25/20- Over the next 30 days the patient will follow up with RN Care Manager regarding medication management to better manage chronic health conditions . New 02/02/20- Over the next 30 days the patient will work with SW to identify resources for prepared meals  CCM SW Interventions: Completed 02/02/20 . Collaboration with RN Care Manager regarding patient need for resources to assist with meal preparation . Patient previously referred to mobile meals in April of 2020 but denied due to the patients ability to drive and leave the home independently o Collaboration with Francene Finders with [facility] whom reports the patient was placed on wait list for congregate meals in June of 2020 . Outbound call placed to Annabell Sabal [phone]) Nutrition Services Director with [facility] to inquire patient place on congregate meal wait list o Voice message left requesting a return call . 02/02/20 3:30 pm o Voice message received from [facility] stating patient has paid for meals in the past but no notes  of patient applying for community nutrition meals o Unsuccessful outbound call placed to Mrs Hall Busing to discuss patients ability to qualify for congregate meal sites - Voice message left requesting a return call  Patient Self Care Activities:  . Patient verbalizes understanding of plan to work with care management team to transition to an ALF . Attends all scheduled provider appointments . Calls pharmacy for medication  refills . Calls provider office for new concerns or questions . Does not adhere to prescribed medication regimen  Please see past updates related to this goal by clicking on the "Past Updates" button in the selected goal          Follow Up Plan: SW will follow up with patient by phone over the next week upon speaking with Mrs. Hall Busing.   [name], BSW, CDP Social Worker, Certified Dementia Practitioner [place] [facility] [place] [facility] [phone]  Total time spent performing care coordination and/or care management activities with the patient by phone or face to face = 8 minutes.

## 2020-02-11 ENCOUNTER — Ambulatory Visit (INDEPENDENT_AMBULATORY_CARE_PROVIDER_SITE_OTHER): Payer: Medicare PPO

## 2020-02-11 DIAGNOSIS — Z597 Insufficient social insurance and welfare support: Secondary | ICD-10-CM | POA: Diagnosis not present

## 2020-02-11 DIAGNOSIS — Z9119 Patient's noncompliance with other medical treatment and regimen: Secondary | ICD-10-CM | POA: Diagnosis not present

## 2020-02-11 DIAGNOSIS — J449 Chronic obstructive pulmonary disease, unspecified: Secondary | ICD-10-CM | POA: Diagnosis not present

## 2020-02-11 NOTE — Chronic Care Management (AMB) (Signed)
Chronic Care Management    Social Work Follow Up Note  02/11/2020 Name: Geoffrey Frank MRN: [id number] DOB: 1926/08/28  Geoffrey Frank is a 86 y.o. year old male who is a primary care patient of Glendale Chard, MD. The CCM team was consulted for assistance with care coordination.   Review of patient status, including review of consultants reports, other relevant assessments, and collaboration with appropriate care team members and the patient's provider was performed as part of comprehensive patient evaluation and provision of chronic care management services.    SDOH (Social Determinants of Health) assessments performed: No    Outpatient Encounter Medications as of 02/11/2020  Medication Sig  . acetaminophen (TYLENOL) 500 MG tablet Take 1,000 mg by mouth daily.   [name] amLODipine-olmesartan (AZOR) 10-40 MG tablet Take 0.5 tablets by mouth daily.  [name] aspirin 81 MG tablet Take 81 mg by mouth daily.  [name] docusate sodium (COLACE) 100 MG capsule Take 100 mg by mouth daily as needed for mild constipation.   . furosemide (LASIX) 40 MG tablet Take 1 tablet (40 mg total) by mouth daily.  [name] lidocaine (XYLOCAINE) 5 % ointment Apply 1 application topically as needed.  . linaclotide (LINZESS) 145 MCG CAPS capsule Take 145 mcg by mouth daily before breakfast.  . metoprolol tartrate (LOPRESSOR) 25 MG tablet Take 1 tablet (25 mg total) by mouth 2 (two) times daily.  [name] nystatin cream (MYCOSTATIN) Apply 1 application topically 2 (two) times daily. Apply to affected area twice a day  . polyethylene glycol powder (MIRALAX) powder Take 17 g by mouth daily as needed for moderate constipation or severe constipation.  . potassium chloride 20 MEQ TBCR Take 20 mEq by mouth daily.   No facility-administered encounter medications on file as of 02/11/2020.     Goals Addressed            This Visit's Progress   . Collaborate with RN Case Manager to assist with patient care coordination needs by assisting with long-term  care planning       CARE PLAN ENTRY (see longtitudinal plan of care for additional care plan information)  Current Barriers:  . Financial constraints related to cost of long term care . Limited social support . Level of care concerns . ADL IADL limitations due to chronic COPD  Social Work Clinical Goal(s):  [name] Over the next 120 days the patient will work with SW to identify a long-term plan for patient living arrangement . New 01/25/20- Over the next 30 days the patient will follow up with RN Care Manager regarding medication management to better manage chronic health conditions . New 02/02/20- Over the next 30 days the patient will work with SW to identify resources for prepared meals Goal Closed 02/11/20  CCM SW Interventions: Completed 02/11/20 . Voice message received from [facility] who recommends patient contact the senior line to complete intake for congregate meal program . Successful outbound call placed to the patient  . Provided education on meal programs offered by senior resources of [place] . Determined the patient is not interested in a congregate meal site stating "I would rather just drive to [place]" . Advised the patient to contact SW should he change his mind and be interested in program and/or if his functional level declines which would qualify the patient for home delivered meals . Assessed for patient outcome of Medicaid application o Determined the patient has yet to submit application o Provided mailing address for patient to submit via  mail  Patient Self Care Activities:  . Patient verbalizes understanding of plan to work with care management team to transition to an ALF . Attends all scheduled provider appointments . Calls pharmacy for medication refills . Calls provider office for new concerns or questions . Does not adhere to prescribed medication regimen  Please see past updates related to this goal by clicking on the "Past Updates" button in the selected  goal          Follow Up Plan: SW will follow up with patient by phone over the next four weeks.   [name], BSW, CDP Social Worker, Certified Dementia Practitioner [place] [facility] [place] [facility] [phone]  Total time spent performing care coordination and/or care management activities with the patient by phone or face to face = 24 minutes.

## 2020-02-11 NOTE — Patient Instructions (Signed)
Social Worker Visit Information  Goals we discussed today:  Goals Addressed            This Visit's Progress   . Collaborate with RN Case Manager to assist with patient care coordination needs by assisting with long-term care planning       CARE PLAN ENTRY (see longtitudinal plan of care for additional care plan information)  Current Barriers:  . Financial constraints related to cost of long term care . Limited social support . Level of care concerns . ADL IADL limitations due to chronic COPD  Social Work Clinical Goal(s):  [name] Over the next 120 days the patient will work with SW to identify a long-term plan for patient living arrangement . New 01/25/20- Over the next 30 days the patient will follow up with RN Care Manager regarding medication management to better manage chronic health conditions . New 02/02/20- Over the next 30 days the patient will work with SW to identify resources for prepared meals Goal Closed 02/11/20  CCM SW Interventions: Completed 02/11/20 . Voice message received from [facility] who recommends patient contact the senior line to complete intake for congregate meal program . Successful outbound call placed to the patient  . Provided education on meal programs offered by senior resources of [address] . Determined the patient is not interested in a congregate meal site stating "I would rather just drive to [place]" . Advised the patient to contact SW should he change his mind and be interested in program and/or if his functional level declines which would qualify the patient for home delivered meals . Assessed for patient outcome of Medicaid application o Determined the patient has yet to submit application o Provided mailing address for patient to submit via mail  Patient Self Care Activities:  . Patient verbalizes understanding of plan to work with care management team to transition to an ALF . Attends all scheduled provider appointments . Calls pharmacy for  medication refills . Calls provider office for new concerns or questions . Does not adhere to prescribed medication regimen  Please see past updates related to this goal by clicking on the "Past Updates" button in the selected goal          Materials Provided: Verbal education about nutrition programs provided by phone  Follow Up Plan: SW will follow up with patient by phone over the next four weeks.   [name], BSW, CDP Social Worker, Certified Dementia Practitioner [place] [facility] [place] [facility] [phone]

## 2020-02-29 ENCOUNTER — Ambulatory Visit: Payer: Self-pay

## 2020-02-29 DIAGNOSIS — J449 Chronic obstructive pulmonary disease, unspecified: Secondary | ICD-10-CM

## 2020-02-29 NOTE — Patient Instructions (Signed)
Social Worker Visit Information  Goals we discussed today:  Goals Addressed            This Visit's Progress   . Collaborate with RN Case Manager to assist with patient care coordination needs by assisting with long-term care planning   On track    [place] (see longtitudinal plan of care for additional care plan information)  Current Barriers:  . Financial constraints related to cost of long term care . Limited social support . Level of care concerns . ADL IADL limitations due to chronic COPD  Social Work Clinical Goal(s):  [name] Over the next 120 days the patient will work with SW to identify a long-term plan for patient living arrangement . New 01/25/20- Over the next 30 days the patient will follow up with RN Care Manager regarding medication management to better manage chronic health conditions . New 02/02/20- Over the next 30 days the patient will work with SW to identify resources for prepared meals Goal Closed 02/11/20  CCM SW Interventions: Completed 02/29/20 . Successful outbound call placed to the patient to assess goal progression . Confirmed the patient has submitted Medicaid application since last spoke with SW but has yet to receive response . Assessed for acute care coordination needs- none identified o Informed by the patient he has not experienced any recent falls but has obtained a personal emergency response system o Encouraged the patient to continue use of device . Scheduled follow up call over the next 6 weeks . Encouraged the patient to contact SW as needed to assist with care coordination needs  Patient Self Care Activities:  . Patient verbalizes understanding of plan to work with care management team to transition to an ALF . Attends all scheduled provider appointments . Calls pharmacy for medication refills . Calls provider office for new concerns or questions . Does not adhere to prescribed medication regimen  Please see past updates related to this  goal by clicking on the "Past Updates" button in the selected goal          Follow Up Plan: SW will follow up with patient by phone over the next 6 weeks.   [name], BSW, CDP Social Worker, Certified Dementia Practitioner [place] [facility] [place] [facility] [phone]

## 2020-02-29 NOTE — Chronic Care Management (AMB) (Signed)
Chronic Care Management    Social Work Follow Up Note  02/29/2020 Name: Geoffrey Frank MRN: [id number] DOB: 05-Jul-1926  Geoffrey Frank is a 86 y.o. year old male who is a primary care patient of Glendale Chard, MD. The CCM team was consulted for assistance with care coordination.   Review of patient status, including review of consultants reports, other relevant assessments, and collaboration with appropriate care team members and the patient's provider was performed as part of comprehensive patient evaluation and provision of chronic care management services.    SDOH (Social Determinants of Health) assessments performed: No.    Outpatient Encounter Medications as of 02/29/2020  Medication Sig  . acetaminophen (TYLENOL) 500 MG tablet Take 1,000 mg by mouth daily.   [name] amLODipine-olmesartan (AZOR) 10-40 MG tablet Take 0.5 tablets by mouth daily.  [name] aspirin 81 MG tablet Take 81 mg by mouth daily.  [name] docusate sodium (COLACE) 100 MG capsule Take 100 mg by mouth daily as needed for mild constipation.   . furosemide (LASIX) 40 MG tablet Take 1 tablet (40 mg total) by mouth daily.  [name] lidocaine (XYLOCAINE) 5 % ointment Apply 1 application topically as needed.  . linaclotide (LINZESS) 145 MCG CAPS capsule Take 145 mcg by mouth daily before breakfast.  . metoprolol tartrate (LOPRESSOR) 25 MG tablet Take 1 tablet (25 mg total) by mouth 2 (two) times daily.  [name] nystatin cream (MYCOSTATIN) Apply 1 application topically 2 (two) times daily. Apply to affected area twice a day  . polyethylene glycol powder (MIRALAX) powder Take 17 g by mouth daily as needed for moderate constipation or severe constipation.  . potassium chloride 20 MEQ TBCR Take 20 mEq by mouth daily.   No facility-administered encounter medications on file as of 02/29/2020.     Goals Addressed            This Visit's Progress   . Collaborate with RN Case Manager to assist with patient care coordination needs by assisting with  long-term care planning   On track    [place] (see longtitudinal plan of care for additional care plan information)  Current Barriers:  . Financial constraints related to cost of long term care . Limited social support . Level of care concerns . ADL IADL limitations due to chronic COPD  Social Work Clinical Goal(s):  [name] Over the next 120 days the patient will work with SW to identify a long-term plan for patient living arrangement . New 01/25/20- Over the next 30 days the patient will follow up with RN Care Manager regarding medication management to better manage chronic health conditions . New 02/02/20- Over the next 30 days the patient will work with SW to identify resources for prepared meals Goal Closed 02/11/20  CCM SW Interventions: Completed 02/29/20 . Successful outbound call placed to the patient to assess goal progression . Confirmed the patient has submitted Medicaid application since last spoke with SW but has yet to receive response . Assessed for acute care coordination needs- none identified o Informed by the patient he has not experienced any recent falls but has obtained a personal emergency response system o Encouraged the patient to continue use of device . Scheduled follow up call over the next 6 weeks . Encouraged the patient to contact SW as needed to assist with care coordination needs  Patient Self Care Activities:  . Patient verbalizes understanding of plan to work with care management team to transition to an ALF . Attends all scheduled  provider appointments . Calls pharmacy for medication refills . Calls provider office for new concerns or questions . Does not adhere to prescribed medication regimen  Please see past updates related to this goal by clicking on the "Past Updates" button in the selected goal          Follow Up Plan: SW will follow up with patient by phone over the next 6 weeks.   [name], BSW, CDP Social Worker, Certified  Dementia Practitioner [place] [facility] [place] [facility] [phone]  Total time spent performing care coordination and/or care management activities with the patient by phone or face to face = 12 minutes.

## 2020-03-02 ENCOUNTER — Encounter: Payer: Self-pay | Admitting: Internal Medicine

## 2020-03-02 ENCOUNTER — Telehealth: Payer: Self-pay

## 2020-03-02 ENCOUNTER — Ambulatory Visit (INDEPENDENT_AMBULATORY_CARE_PROVIDER_SITE_OTHER): Payer: Medicare PPO

## 2020-03-02 VITALS — Ht 72.0 in | Wt 165.0 lb

## 2020-03-02 DIAGNOSIS — Z0000 Encounter for general adult medical examination without abnormal findings: Secondary | ICD-10-CM | POA: Diagnosis not present

## 2020-03-02 NOTE — Progress Notes (Signed)
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 Medical and social history . Use of alcohol, tobacco or illicit drugs  . Current medications and supplements . Functional ability and status . Nutritional status . Physical activity . Advanced directives . List of other physicians . Hospitalizations, surgeries, and ER visits in previous 12 months . Vitals . Screenings to include cognitive, depression, and falls . Referrals and appointments  In addition, I have reviewed and discussed with patient certain preventive protocols, quality metrics, and best practice recommendations. A written personalized care plan for preventive services as well as general preventive health recommendations were provided to patient.     Kellie Simmering, LPN   10/21/2480   Nurse Notes: 6 CIT was not completed, would not load. Error message.

## 2020-03-02 NOTE — Telephone Encounter (Signed)
The patient wanted to know if he could have his visit over the phone and I told him yes.

## 2020-03-02 NOTE — Patient Instructions (Signed)
[name] , Thank you for taking time to come for your Medicare Wellness Visit. I appreciate your ongoing commitment to your health goals. Please review the following plan we discussed and let me know if I can assist you in the future.   Screening recommendations/referrals: Colonoscopy: not required Recommended yearly ophthalmology/optometry visit for glaucoma screening and checkup Recommended yearly dental visit for hygiene and checkup  Vaccinations: Influenza vaccine: completed 06/09/2019, due 04/09/2020 Pneumococcal vaccine: decline Tdap vaccine: decline Shingles vaccine: discussed   Covid-19: 10/23/2019, 11/20/2019  Advanced directives: Please bring a copy of your POA (Power of Attorney) and/or Living Will to your next appointment.   Conditions/risks identified: none  Next appointment: 05/04/2020 at 4:15  Follow up in one year for your annual wellness visit.   Preventive Care 84 Years and Older, Male Preventive care refers to lifestyle choices and visits with your health care provider that can promote health and wellness. What does preventive care include?  A yearly physical exam. This is also called an annual well check.  Dental exams once or twice a year.  Routine eye exams. Ask your health care provider how often you should have your eyes checked.  Personal lifestyle choices, including:  Daily care of your teeth and gums.  Regular physical activity.  Eating a healthy diet.  Avoiding tobacco and drug use.  Limiting alcohol use.  Practicing safe sex.  Taking low doses of aspirin every day.  Taking vitamin and mineral supplements as recommended by your health care provider. What happens during an annual well check? The services and screenings done by your health care provider during your annual well check will depend on your age, overall health, lifestyle risk factors, and family history of disease. Counseling  Your health care provider may ask you questions about  your:  Alcohol use.  Tobacco use.  Drug use.  Emotional well-being.  Home and relationship well-being.  Sexual activity.  Eating habits.  History of falls.  Memory and ability to understand (cognition).  Work and work Statistician. Screening  You may have the following tests or measurements:  Height, weight, and BMI.  Blood pressure.  Lipid and cholesterol levels. These may be checked every 5 years, or more frequently if you are over 84 years old.  Skin check.  Lung cancer screening. You may have this screening every year starting at age 84 if you have a 30-pack-year history of smoking and currently smoke or have quit within the past 15 years.  Fecal occult blood test (FOBT) of the stool. You may have this test every year starting at age 84.  Flexible sigmoidoscopy or colonoscopy. You may have a sigmoidoscopy every 5 years or a colonoscopy every 10 years starting at age 84.  Prostate cancer screening. Recommendations will vary depending on your family history and other risks.  Hepatitis C blood test.  Hepatitis B blood test.  Sexually transmitted disease (STD) testing.  Diabetes screening. This is done by checking your blood sugar (glucose) after you have not eaten for a while (fasting). You may have this done every 1-3 years.  Abdominal aortic aneurysm (AAA) screening. You may need this if you are a current or former smoker.  Osteoporosis. You may be screened starting at age 84 if you are at high risk. Talk with your health care provider about your test results, treatment options, and if necessary, the need for more tests. Vaccines  Your health care provider may recommend certain vaccines, such as:  Influenza vaccine. This is recommended  every year.  Tetanus, diphtheria, and acellular pertussis (Tdap, Td) vaccine. You may need a Td booster every 10 years.  Zoster vaccine. You may need this after age 43.  Pneumococcal 13-valent conjugate (PCV13) vaccine.  One dose is recommended after age 84.  Pneumococcal polysaccharide (PPSV23) vaccine. One dose is recommended after age 39. Talk to your health care provider about which screenings and vaccines you need and how often you need them. This information is not intended to replace advice given to you by your health care provider. Make sure you discuss any questions you have with your health care provider. Document Released: 09/22/2015 Document Revised: 05/15/2016 Document Reviewed: 06/27/2015 Elsevier Interactive Patient Education  2017 [employer] Prevention in the Home Falls can cause injuries. They can happen to people of all ages. There are many things you can do to make your home safe and to help prevent falls. What can I do on the outside of my home?  Regularly fix the edges of walkways and driveways and fix any cracks.  Remove anything that might make you trip as you walk through a door, such as a raised step or threshold.  Trim any bushes or trees on the path to your home.  Use bright outdoor lighting.  Clear any walking paths of anything that might make someone trip, such as rocks or tools.  Regularly check to see if handrails are loose or broken. Make sure that both sides of any steps have handrails.  Any raised decks and porches should have guardrails on the edges.  Have any leaves, snow, or ice cleared regularly.  Use sand or salt on walking paths during winter.  Clean up any spills in your garage right away. This includes oil or grease spills. What can I do in the bathroom?  Use night lights.  Install grab bars by the toilet and in the tub and shower. Do not use towel bars as grab bars.  Use non-skid mats or decals in the tub or shower.  If you need to sit down in the shower, use a plastic, non-slip stool.  Keep the floor dry. Clean up any water that spills on the floor as soon as it happens.  Remove soap buildup in the tub or shower regularly.  Attach bath  mats securely with double-sided non-slip rug tape.  Do not have throw rugs and other things on the floor that can make you trip. What can I do in the bedroom?  Use night lights.  Make sure that you have a light by your bed that is easy to reach.  Do not use any sheets or blankets that are too big for your bed. They should not hang down onto the floor.  Have a firm chair that has side arms. You can use this for support while you get dressed.  Do not have throw rugs and other things on the floor that can make you trip. What can I do in the kitchen?  Clean up any spills right away.  Avoid walking on wet floors.  Keep items that you use a lot in easy-to-reach places.  If you need to reach something above you, use a strong step stool that has a grab bar.  Keep electrical cords out of the way.  Do not use floor polish or wax that makes floors slippery. If you must use wax, use non-skid floor wax.  Do not have throw rugs and other things on the floor that can make you trip. What  can I do with my stairs?  Do not leave any items on the stairs.  Make sure that there are handrails on both sides of the stairs and use them. Fix handrails that are broken or loose. Make sure that handrails are as long as the stairways.  Check any carpeting to make sure that it is firmly attached to the stairs. Fix any carpet that is loose or worn.  Avoid having throw rugs at the top or bottom of the stairs. If you do have throw rugs, attach them to the floor with carpet tape.  Make sure that you have a light switch at the top of the stairs and the bottom of the stairs. If you do not have them, ask someone to add them for you. What else can I do to help prevent falls?  Wear shoes that:  Do not have high heels.  Have rubber bottoms.  Are comfortable and fit you well.  Are closed at the toe. Do not wear sandals.  If you use a stepladder:  Make sure that it is fully opened. Do not climb a closed  stepladder.  Make sure that both sides of the stepladder are locked into place.  Ask someone to hold it for you, if possible.  Clearly mark and make sure that you can see:  Any grab bars or handrails.  First and last steps.  Where the edge of each step is.  Use tools that help you move around (mobility aids) if they are needed. These include:  Canes.  Walkers.  Scooters.  Crutches.  Turn on the lights when you go into a dark area. Replace any light bulbs as soon as they burn out.  Set up your furniture so you have a clear path. Avoid moving your furniture around.  If any of your floors are uneven, fix them.  If there are any pets around you, be aware of where they are.  Review your medicines with your doctor. Some medicines can make you feel dizzy. This can increase your chance of falling. Ask your doctor what other things that you can do to help prevent falls. This information is not intended to replace advice given to you by your health care provider. Make sure you discuss any questions you have with your health care provider. Document Released: 06/22/2009 Document Revised: 02/01/2016 Document Reviewed: 09/30/2014 Elsevier Interactive Patient Education  2017 [employer].

## 2020-03-20 ENCOUNTER — Telehealth: Payer: Self-pay

## 2020-03-20 NOTE — Telephone Encounter (Signed)
The pt asked for a call back.  He didn't leave on the voicemail what he needd.

## 2020-03-25 DIAGNOSIS — R634 Abnormal weight loss: Secondary | ICD-10-CM | POA: Diagnosis not present

## 2020-03-25 DIAGNOSIS — Z8546 Personal history of malignant neoplasm of prostate: Secondary | ICD-10-CM | POA: Diagnosis not present

## 2020-03-25 DIAGNOSIS — Z96643 Presence of artificial hip joint, bilateral: Secondary | ICD-10-CM | POA: Diagnosis not present

## 2020-03-25 DIAGNOSIS — K5900 Constipation, unspecified: Secondary | ICD-10-CM | POA: Diagnosis not present

## 2020-04-06 ENCOUNTER — Telehealth: Payer: Self-pay

## 2020-04-06 NOTE — Telephone Encounter (Signed)
°  Chronic Care Management   Outreach Note  04/06/2020 Name: Geoffrey Frank MRN: [id number] DOB: 1925-09-28  Referred by: Glendale Chard, MD Reason for referral : Care Coordination   An unsuccessful telephone outreach was attempted today to assess progression of patient stated goal. SW unable to leave HIPAA compliant voice message requesting a return call due to the patients voice mailbox being full. The patient was referred to the case management team for assistance with care management and care coordination.   Follow Up Plan: The care management team will reach out to the patient again over the next 14 days.   [name], BSW, CDP Social Worker, Certified Dementia Practitioner [place] [facility] [place] [facility] [phone]

## 2020-04-19 ENCOUNTER — Telehealth: Payer: Self-pay

## 2020-04-19 ENCOUNTER — Telehealth: Payer: Medicare PPO

## 2020-04-19 NOTE — Telephone Encounter (Signed)
  Chronic Care Management   Outreach Note  04/19/2020 Name: Geoffrey Frank MRN: [id number] DOB: 05/14/1926  Referred by: Glendale Chard, MD Reason for referral : Care Coordination   A second unsuccessful telephone outreach was attempted today. The patient was referred to the case management team for assistance with care management and care coordination.   Follow Up Plan: A HIPPA compliant phone message was left for the patient providing contact information and requesting a return call.  The care management team will reach out to the patient again over the next 10 days.   [name], BSW, CDP Social Worker, Certified Dementia Practitioner [place] [facility] [place] [facility] [phone]

## 2020-04-27 ENCOUNTER — Ambulatory Visit: Payer: Medicare PPO

## 2020-04-27 DIAGNOSIS — J449 Chronic obstructive pulmonary disease, unspecified: Secondary | ICD-10-CM

## 2020-04-27 NOTE — Patient Instructions (Signed)
Licensed Clinical Social Worker Visit Information  Goals we discussed today:  Goals Addressed            This Visit's Progress   . COMPLETED: Collaborate with RN Case Manager to assist with patient care coordination needs by assisting with long-term care planning       CARE PLAN ENTRY (see longtitudinal plan of care for additional care plan information)  Current Barriers:  . Financial constraints related to cost of long term care . Limited social support . Level of care concerns . ADL IADL limitations due to chronic COPD  Social Work Clinical Goal(s):  [name] Over the next 120 days the patient will work with SW to identify a long-term plan for patient living arrangement . New 01/25/20- Over the next 30 days the patient will follow up with RN Care Manager regarding medication management to better manage chronic health conditions . New 02/02/20- Over the next 30 days the patient will work with SW to identify resources for prepared meals Goal Closed 02/11/20  CCM SW Interventions: Completed 04/27/20 . Successful outbound call placed to the patient to assess for long-term care coordination needs . Determined the patient has no needs at this time . Collaboration with RN Care Manager regarding goal closure o The patient will be engaged by [facility] over the next 6 weeks Completed 02/29/20 . Successful outbound call placed to the patient to assess goal progression . Confirmed the patient has submitted Medicaid application since last spoke with SW but has yet to receive response . Assessed for acute care coordination needs- none identified o Informed by the patient he has not experienced any recent falls but has obtained a personal emergency response system o Encouraged the patient to continue use of device . Scheduled follow up call over the next 6 weeks . Encouraged the patient to contact SW as needed to assist with care coordination needs  Patient Self Care Activities:  . Patient  verbalizes understanding of plan to work with care management team to transition to an ALF . Attends all scheduled provider appointments . Calls pharmacy for medication refills . Calls provider office for new concerns or questions . Does not adhere to prescribed medication regimen  Please see past updates related to this goal by clicking on the "Past Updates" button in the selected goal         Follow Up Plan: No SW follow up planned at this time. The patient will remain active with RN Care Manager   [name], BSW, CDP Social Worker, Certified Dementia Practitioner [place] [facility] [place] [facility] [phone]

## 2020-04-27 NOTE — Chronic Care Management (AMB) (Signed)
Chronic Care Management    Social Work Follow Up Note  04/27/2020 Name: Geoffrey Frank MRN: [id number] DOB: December 30, 1925  Geoffrey Frank is a 86 y.o. year old male who is a primary care patient of Glendale Chard, MD. The CCM team was consulted for assistance with care coordination.   Review of patient status, including review of consultants reports, other relevant assessments, and collaboration with appropriate care team members and the patient's provider was performed as part of comprehensive patient evaluation and provision of chronic care management services.    SDOH (Social Determinants of Health) assessments performed: No    Outpatient Encounter Medications as of 04/27/2020  Medication Sig  . acetaminophen (TYLENOL) 500 MG tablet Take 1,000 mg by mouth daily.   [name] amLODipine-olmesartan (AZOR) 10-40 MG tablet Take 0.5 tablets by mouth daily.  [name] aspirin 81 MG tablet Take 81 mg by mouth daily.  [name] docusate sodium (COLACE) 100 MG capsule Take 100 mg by mouth daily as needed for mild constipation.   . furosemide (LASIX) 40 MG tablet Take 1 tablet (40 mg total) by mouth daily.  [name] lidocaine (XYLOCAINE) 5 % ointment Apply 1 application topically as needed.  . linaclotide (LINZESS) 145 MCG CAPS capsule Take 145 mcg by mouth daily before breakfast.  . metoprolol tartrate (LOPRESSOR) 25 MG tablet Take 1 tablet (25 mg total) by mouth 2 (two) times daily.  [name] nystatin cream (MYCOSTATIN) Apply 1 application topically 2 (two) times daily. Apply to affected area twice a day  . polyethylene glycol powder (MIRALAX) powder Take 17 g by mouth daily as needed for moderate constipation or severe constipation.  . potassium chloride 20 MEQ TBCR Take 20 mEq by mouth daily.   No facility-administered encounter medications on file as of 04/27/2020.     Goals Addressed            This Visit's Progress   . COMPLETED: Collaborate with RN Case Manager to assist with patient care coordination needs by assisting  with long-term care planning       CARE PLAN ENTRY (see longtitudinal plan of care for additional care plan information)  Current Barriers:  . Financial constraints related to cost of long term care . Limited social support . Level of care concerns . ADL IADL limitations due to chronic COPD  Social Work Clinical Goal(s):  [name] Over the next 120 days the patient will work with SW to identify a long-term plan for patient living arrangement . New 01/25/20- Over the next 30 days the patient will follow up with RN Care Manager regarding medication management to better manage chronic health conditions . New 02/02/20- Over the next 30 days the patient will work with SW to identify resources for prepared meals Goal Closed 02/11/20  CCM SW Interventions: Completed 04/27/20 . Successful outbound call placed to the patient to assess for long-term care coordination needs . Determined the patient has no needs at this time . Collaboration with RN Care Manager regarding goal closure o The patient will be engaged by [facility] over the next 6 weeks Completed 02/29/20 . Successful outbound call placed to the patient to assess goal progression . Confirmed the patient has submitted Medicaid application since last spoke with SW but has yet to receive response . Assessed for acute care coordination needs- none identified o Informed by the patient he has not experienced any recent falls but has obtained a personal emergency response system o Encouraged the patient to continue use of device .  Scheduled follow up call over the next 6 weeks . Encouraged the patient to contact SW as needed to assist with care coordination needs  Patient Self Care Activities:  . Patient verbalizes understanding of plan to work with care management team to transition to an ALF . Attends all scheduled provider appointments . Calls pharmacy for medication refills . Calls provider office for new concerns or questions . Does not  adhere to prescribed medication regimen  Please see past updates related to this goal by clicking on the "Past Updates" button in the selected goal          Follow Up Plan: No SW follow up planned at this time. The patient will remain active with RN Care Manager.   [name], BSW, CDP Social Worker, Certified Dementia Practitioner [place] [facility] [place] [facility] [phone]  Total time spent performing care coordination and/or care management activities with the patient by phone or face to face = 15 minutes.

## 2020-05-04 ENCOUNTER — Encounter: Payer: Self-pay | Admitting: Internal Medicine

## 2020-05-08 ENCOUNTER — Telehealth: Payer: Self-pay

## 2020-05-08 NOTE — Telephone Encounter (Signed)
I left a message that I was returning [name]'s call and for her to call the office back when she can.

## 2020-05-09 ENCOUNTER — Telehealth: Payer: Self-pay

## 2020-05-09 NOTE — Telephone Encounter (Signed)
I returned a call to Ms. Pearletha Furl, I left a message for her to call back when she can.

## 2020-05-09 NOTE — Telephone Encounter (Signed)
The pt's niece called and wanted to see if the pt could be seen sooner, she has some concerns because the pt has fallen out the bed 2 times and she also wanted to make sure that his appt is when she can bring him because she has to bring him to his appts.

## 2020-05-18 ENCOUNTER — Inpatient Hospital Stay (HOSPITAL_COMMUNITY)
Admission: EM | Admit: 2020-05-18 | Discharge: 2020-05-22 | DRG: 602 | Disposition: A | Discharge status: Skilled Nursing Facility | Payer: Medicare PPO | Attending: Internal Medicine | Admitting: Internal Medicine

## 2020-05-18 ENCOUNTER — Ambulatory Visit (INDEPENDENT_AMBULATORY_CARE_PROVIDER_SITE_OTHER): Payer: Medicare PPO | Admitting: Internal Medicine

## 2020-05-18 ENCOUNTER — Encounter: Payer: Self-pay | Admitting: Internal Medicine

## 2020-05-18 ENCOUNTER — Emergency Department (HOSPITAL_COMMUNITY): Payer: Medicare PPO

## 2020-05-18 ENCOUNTER — Encounter (HOSPITAL_COMMUNITY): Payer: Self-pay

## 2020-05-18 ENCOUNTER — Other Ambulatory Visit: Payer: Self-pay

## 2020-05-18 VITALS — BP 108/66 | HR 86 | Temp 98.3°F | Ht 72.0 in

## 2020-05-18 DIAGNOSIS — L0390 Cellulitis, unspecified: Secondary | ICD-10-CM

## 2020-05-18 DIAGNOSIS — E11621 Type 2 diabetes mellitus with foot ulcer: Secondary | ICD-10-CM | POA: Diagnosis present

## 2020-05-18 DIAGNOSIS — I130 Hypertensive heart and chronic kidney disease with heart failure and stage 1 through stage 4 chronic kidney disease, or unspecified chronic kidney disease: Secondary | ICD-10-CM

## 2020-05-18 DIAGNOSIS — Z8042 Family history of malignant neoplasm of prostate: Secondary | ICD-10-CM

## 2020-05-18 DIAGNOSIS — Z66 Do not resuscitate: Secondary | ICD-10-CM | POA: Diagnosis present

## 2020-05-18 DIAGNOSIS — J310 Chronic rhinitis: Secondary | ICD-10-CM | POA: Diagnosis present

## 2020-05-18 DIAGNOSIS — R41841 Cognitive communication deficit: Secondary | ICD-10-CM | POA: Diagnosis not present

## 2020-05-18 DIAGNOSIS — G92 Toxic encephalopathy: Secondary | ICD-10-CM | POA: Diagnosis present

## 2020-05-18 DIAGNOSIS — Z0000 Encounter for general adult medical examination without abnormal findings: Secondary | ICD-10-CM

## 2020-05-18 DIAGNOSIS — L97519 Non-pressure chronic ulcer of other part of right foot with unspecified severity: Secondary | ICD-10-CM

## 2020-05-18 DIAGNOSIS — J449 Chronic obstructive pulmonary disease, unspecified: Secondary | ICD-10-CM | POA: Diagnosis not present

## 2020-05-18 DIAGNOSIS — F05 Delirium due to known physiological condition: Secondary | ICD-10-CM | POA: Diagnosis present

## 2020-05-18 DIAGNOSIS — M2550 Pain in unspecified joint: Secondary | ICD-10-CM | POA: Diagnosis not present

## 2020-05-18 DIAGNOSIS — E1122 Type 2 diabetes mellitus with diabetic chronic kidney disease: Secondary | ICD-10-CM | POA: Diagnosis present

## 2020-05-18 DIAGNOSIS — N390 Urinary tract infection, site not specified: Secondary | ICD-10-CM

## 2020-05-18 DIAGNOSIS — I739 Peripheral vascular disease, unspecified: Secondary | ICD-10-CM | POA: Diagnosis not present

## 2020-05-18 DIAGNOSIS — R4182 Altered mental status, unspecified: Secondary | ICD-10-CM | POA: Diagnosis not present

## 2020-05-18 DIAGNOSIS — I7090 Unspecified atherosclerosis: Secondary | ICD-10-CM | POA: Diagnosis not present

## 2020-05-18 DIAGNOSIS — I129 Hypertensive chronic kidney disease with stage 1 through stage 4 chronic kidney disease, or unspecified chronic kidney disease: Secondary | ICD-10-CM

## 2020-05-18 DIAGNOSIS — I471 Supraventricular tachycardia, unspecified: Secondary | ICD-10-CM | POA: Diagnosis present

## 2020-05-18 DIAGNOSIS — Z87891 Personal history of nicotine dependence: Secondary | ICD-10-CM | POA: Diagnosis not present

## 2020-05-18 DIAGNOSIS — L97919 Non-pressure chronic ulcer of unspecified part of right lower leg with unspecified severity: Secondary | ICD-10-CM | POA: Diagnosis not present

## 2020-05-18 DIAGNOSIS — R600 Localized edema: Secondary | ICD-10-CM | POA: Diagnosis present

## 2020-05-18 DIAGNOSIS — L03115 Cellulitis of right lower limb: Secondary | ICD-10-CM | POA: Diagnosis not present

## 2020-05-18 DIAGNOSIS — K589 Irritable bowel syndrome without diarrhea: Secondary | ICD-10-CM | POA: Diagnosis present

## 2020-05-18 DIAGNOSIS — I5032 Chronic diastolic (congestive) heart failure: Secondary | ICD-10-CM

## 2020-05-18 DIAGNOSIS — E860 Dehydration: Secondary | ICD-10-CM | POA: Diagnosis present

## 2020-05-18 DIAGNOSIS — E1151 Type 2 diabetes mellitus with diabetic peripheral angiopathy without gangrene: Secondary | ICD-10-CM | POA: Diagnosis present

## 2020-05-18 DIAGNOSIS — D509 Iron deficiency anemia, unspecified: Secondary | ICD-10-CM | POA: Diagnosis present

## 2020-05-18 DIAGNOSIS — R2681 Unsteadiness on feet: Secondary | ICD-10-CM | POA: Diagnosis not present

## 2020-05-18 DIAGNOSIS — C679 Malignant neoplasm of bladder, unspecified: Secondary | ICD-10-CM | POA: Diagnosis not present

## 2020-05-18 DIAGNOSIS — F329 Major depressive disorder, single episode, unspecified: Secondary | ICD-10-CM | POA: Diagnosis present

## 2020-05-18 DIAGNOSIS — Z841 Family history of disorders of kidney and ureter: Secondary | ICD-10-CM

## 2020-05-18 DIAGNOSIS — Z8551 Personal history of malignant neoplasm of bladder: Secondary | ICD-10-CM

## 2020-05-18 DIAGNOSIS — Z7982 Long term (current) use of aspirin: Secondary | ICD-10-CM

## 2020-05-18 DIAGNOSIS — Z8546 Personal history of malignant neoplasm of prostate: Secondary | ICD-10-CM

## 2020-05-18 DIAGNOSIS — Z86010 Personal history of colonic polyps: Secondary | ICD-10-CM

## 2020-05-18 DIAGNOSIS — B9689 Other specified bacterial agents as the cause of diseases classified elsewhere: Secondary | ICD-10-CM | POA: Diagnosis not present

## 2020-05-18 DIAGNOSIS — R296 Repeated falls: Secondary | ICD-10-CM | POA: Diagnosis present

## 2020-05-18 DIAGNOSIS — I10 Essential (primary) hypertension: Secondary | ICD-10-CM | POA: Diagnosis not present

## 2020-05-18 DIAGNOSIS — Z87442 Personal history of urinary calculi: Secondary | ICD-10-CM

## 2020-05-18 DIAGNOSIS — Z20822 Contact with and (suspected) exposure to covid-19: Secondary | ICD-10-CM | POA: Diagnosis present

## 2020-05-18 DIAGNOSIS — Z7401 Bed confinement status: Secondary | ICD-10-CM | POA: Diagnosis not present

## 2020-05-18 DIAGNOSIS — N182 Chronic kidney disease, stage 2 (mild): Secondary | ICD-10-CM | POA: Diagnosis not present

## 2020-05-18 DIAGNOSIS — D649 Anemia, unspecified: Secondary | ICD-10-CM

## 2020-05-18 DIAGNOSIS — L089 Local infection of the skin and subcutaneous tissue, unspecified: Secondary | ICD-10-CM | POA: Diagnosis not present

## 2020-05-18 DIAGNOSIS — M1990 Unspecified osteoarthritis, unspecified site: Secondary | ICD-10-CM | POA: Diagnosis present

## 2020-05-18 DIAGNOSIS — M6281 Muscle weakness (generalized): Secondary | ICD-10-CM | POA: Diagnosis not present

## 2020-05-18 DIAGNOSIS — Z8249 Family history of ischemic heart disease and other diseases of the circulatory system: Secondary | ICD-10-CM

## 2020-05-18 DIAGNOSIS — Z79899 Other long term (current) drug therapy: Secondary | ICD-10-CM

## 2020-05-18 DIAGNOSIS — C677 Malignant neoplasm of urachus: Secondary | ICD-10-CM | POA: Diagnosis not present

## 2020-05-18 DIAGNOSIS — R410 Disorientation, unspecified: Secondary | ICD-10-CM | POA: Diagnosis not present

## 2020-05-18 DIAGNOSIS — M7989 Other specified soft tissue disorders: Secondary | ICD-10-CM | POA: Diagnosis not present

## 2020-05-18 LAB — CBC WITH DIFFERENTIAL/PLATELET
Abs Immature Granulocytes: 0.03 10*3/uL (ref 0.00–0.07)
Basophils Absolute: 0 10*3/uL (ref 0.0–0.1)
Basophils Relative: 0 %
Eosinophils Absolute: 0 10*3/uL (ref 0.0–0.5)
Eosinophils Relative: 0 %
HCT: 30.7 % — ABNORMAL LOW (ref 39.0–52.0)
Hemoglobin: 9.2 g/dL — ABNORMAL LOW (ref 13.0–17.0)
Immature Granulocytes: 1 %
Lymphocytes Relative: 11 %
Lymphs Abs: 0.7 10*3/uL (ref 0.7–4.0)
MCH: 24 pg — ABNORMAL LOW (ref 26.0–34.0)
MCHC: 30 g/dL (ref 30.0–36.0)
MCV: 80.2 fL (ref 80.0–100.0)
Monocytes Absolute: 0.7 10*3/uL (ref 0.1–1.0)
Monocytes Relative: 11 %
Neutro Abs: 4.7 10*3/uL (ref 1.7–7.7)
Neutrophils Relative %: 77 %
Platelets: 227 10*3/uL (ref 150–400)
RBC: 3.83 MIL/uL — ABNORMAL LOW (ref 4.22–5.81)
RDW: 15.9 % — ABNORMAL HIGH (ref 11.5–15.5)
WBC: 6.1 10*3/uL (ref 4.0–10.5)
nRBC: 0 % (ref 0.0–0.2)

## 2020-05-18 LAB — URINALYSIS, ROUTINE W REFLEX MICROSCOPIC
Bilirubin Urine: NEGATIVE
Glucose, UA: NEGATIVE mg/dL
Ketones, ur: NEGATIVE mg/dL
Nitrite: NEGATIVE
Protein, ur: 30 mg/dL — AB
Specific Gravity, Urine: 1.039 — ABNORMAL HIGH (ref 1.005–1.030)
WBC, UA: >50 WBC/hpf — ABNORMAL HIGH (ref 0–5)
pH: 5 (ref 5.0–8.0)

## 2020-05-18 LAB — COMPREHENSIVE METABOLIC PANEL
ALT: 18 U/L (ref 0–44)
AST: 20 U/L (ref 15–41)
Albumin: 3.3 g/dL — ABNORMAL LOW (ref 3.5–5.0)
Alkaline Phosphatase: 40 U/L (ref 38–126)
Anion gap: 10 (ref 5–15)
BUN: 30 mg/dL — ABNORMAL HIGH (ref 8–23)
CO2: 27 mmol/L (ref 22–32)
Calcium: 9.2 mg/dL (ref 8.9–10.3)
Chloride: 105 mmol/L (ref 98–111)
Creatinine, Ser: 1.02 mg/dL (ref 0.61–1.24)
GFR calc Af Amer: >60 mL/min (ref >60)
GFR calc non Af Amer: >60 mL/min (ref >60)
Glucose, Bld: 123 mg/dL — ABNORMAL HIGH (ref 70–99)
Potassium: 4.1 mmol/L (ref 3.5–5.1)
Sodium: 142 mmol/L (ref 135–145)
Total Bilirubin: 0.7 mg/dL (ref 0.3–1.2)
Total Protein: 6 g/dL — ABNORMAL LOW (ref 6.5–8.1)

## 2020-05-18 LAB — LACTIC ACID, PLASMA
Lactic Acid, Venous: 2.9 mmol/L (ref 0.5–1.9)
Lactic Acid, Venous: 2.8 mmol/L (ref 0.5–1.9)

## 2020-05-18 MED ORDER — POTASSIUM CHLORIDE CRYS ER 20 MEQ PO TBCR
20.0000 meq | EXTENDED_RELEASE_TABLET | Freq: Every day | ORAL | Status: DC
  Administered 2020-05-19 – 2020-05-22 (×4): 20 meq via ORAL
  Filled 2020-05-18 (×4): qty 1

## 2020-05-18 MED ORDER — AMLODIPINE-OLMESARTAN 10-40 MG PO TABS: 0.5000 | ORAL_TABLET | Freq: Every day | ORAL | Status: DC

## 2020-05-18 MED ORDER — LINACLOTIDE 145 MCG PO CAPS
145.0000 ug | ORAL_CAPSULE | Freq: Every day | ORAL | Status: DC
  Administered 2020-05-19 – 2020-05-22 (×4): 145 ug via ORAL
  Filled 2020-05-18 (×4): qty 1

## 2020-05-18 MED ORDER — VANCOMYCIN HCL 500 MG/100ML IV SOLN
500.0000 mg | Freq: Once | INTRAVENOUS | Status: AC
  Administered 2020-05-18: 500 mg via INTRAVENOUS
  Filled 2020-05-18: qty 100

## 2020-05-18 MED ORDER — METOPROLOL TARTRATE 25 MG PO TABS
25.0000 mg | ORAL_TABLET | Freq: Two times a day (BID) | ORAL | Status: DC
  Administered 2020-05-18 – 2020-05-22 (×7): 25 mg via ORAL
  Filled 2020-05-18 (×8): qty 1

## 2020-05-18 MED ORDER — VANCOMYCIN HCL IN DEXTROSE 1-5 GM/200ML-% IV SOLN
1000.0000 mg | Freq: Once | INTRAVENOUS | Status: AC
  Administered 2020-05-18: 1000 mg via INTRAVENOUS
  Filled 2020-05-18: qty 200

## 2020-05-18 MED ORDER — ACETAMINOPHEN 650 MG RE SUPP: 650.0000 mg | Freq: Four times a day (QID) | RECTAL | Status: DC | PRN

## 2020-05-18 MED ORDER — ASPIRIN EC 81 MG PO TBEC
81.0000 mg | DELAYED_RELEASE_TABLET | Freq: Every day | ORAL | Status: DC
  Administered 2020-05-19 – 2020-05-22 (×4): 81 mg via ORAL
  Filled 2020-05-18 (×4): qty 1

## 2020-05-18 MED ORDER — IOHEXOL 300 MG/ML  SOLN
100.0000 mL | Freq: Once | INTRAMUSCULAR | Status: AC | PRN
  Administered 2020-05-18: 100 mL via INTRAVENOUS

## 2020-05-18 MED ORDER — ONDANSETRON HCL 4 MG PO TABS: 4.0000 mg | ORAL_TABLET | Freq: Four times a day (QID) | ORAL | Status: DC | PRN

## 2020-05-18 MED ORDER — SODIUM CHLORIDE 0.9 % IV SOLN
1.0000 g | INTRAVENOUS | Status: DC
  Administered 2020-05-18: 1 g via INTRAVENOUS
  Filled 2020-05-18: qty 10

## 2020-05-18 MED ORDER — POLYETHYLENE GLYCOL 3350 17 G PO PACK: 17.0000 g | PACK | Freq: Every day | ORAL | Status: DC | PRN

## 2020-05-18 MED ORDER — ONDANSETRON HCL 4 MG/2ML IJ SOLN: 4.0000 mg | Freq: Four times a day (QID) | INTRAMUSCULAR | Status: DC | PRN

## 2020-05-18 MED ORDER — OXYCODONE HCL 5 MG PO TABS: 5.0000 mg | ORAL_TABLET | ORAL | Status: DC | PRN

## 2020-05-18 MED ORDER — TRAZODONE HCL 50 MG PO TABS
50.0000 mg | ORAL_TABLET | Freq: Every evening | ORAL | Status: DC | PRN
  Administered 2020-05-19: 50 mg via ORAL
  Filled 2020-05-18: qty 1

## 2020-05-18 MED ORDER — ENOXAPARIN SODIUM 40 MG/0.4ML ~~LOC~~ SOLN
40.0000 mg | SUBCUTANEOUS | Status: DC
  Administered 2020-05-19 – 2020-05-22 (×4): 40 mg via SUBCUTANEOUS
  Filled 2020-05-18 (×4): qty 0.4

## 2020-05-18 MED ORDER — ACETAMINOPHEN 325 MG PO TABS: 650.0000 mg | ORAL_TABLET | Freq: Four times a day (QID) | ORAL | Status: DC | PRN

## 2020-05-18 MED ORDER — FUROSEMIDE 40 MG PO TABS
40.0000 mg | ORAL_TABLET | Freq: Every day | ORAL | Status: DC
  Administered 2020-05-19: 40 mg via ORAL
  Filled 2020-05-18: qty 1

## 2020-05-18 MED ORDER — SODIUM CHLORIDE 0.9% FLUSH
3.0000 mL | Freq: Two times a day (BID) | INTRAVENOUS | Status: DC
  Administered 2020-05-18 – 2020-05-22 (×6): 3 mL via INTRAVENOUS

## 2020-05-18 NOTE — ED Notes (Addendum)
Date and time results received: 05/18/20 1949 (use smartphrase ".now" to insert current time)  Test: Lactic Acid Critical Value: 2.9 Name of Provider Notified: Zenia Resides Md  Orders Received? Or Actions Taken?: waiting on orders

## 2020-05-18 NOTE — ED Notes (Signed)
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
## 2020-05-18 NOTE — Patient Instructions (Signed)

## 2020-05-18 NOTE — ED Triage Notes (Signed)
Patient has open wounds to the right lateral foot and shin area. Patient went to his PCP today and a wound care person saw the patient as well. Patient was sent to the sent for possible admission and IV antibiotics.
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
## 2020-05-19 DIAGNOSIS — L03115 Cellulitis of right lower limb: Principal | ICD-10-CM

## 2020-05-19 DIAGNOSIS — R600 Localized edema: Secondary | ICD-10-CM

## 2020-05-19 DIAGNOSIS — N182 Chronic kidney disease, stage 2 (mild): Secondary | ICD-10-CM

## 2020-05-19 DIAGNOSIS — I5032 Chronic diastolic (congestive) heart failure: Secondary | ICD-10-CM

## 2020-05-19 LAB — BASIC METABOLIC PANEL
Anion gap: 7 (ref 5–15)
BUN: 27 mg/dL — ABNORMAL HIGH (ref 8–23)
CO2: 28 mmol/L (ref 22–32)
Calcium: 8.6 mg/dL — ABNORMAL LOW (ref 8.9–10.3)
Chloride: 103 mmol/L (ref 98–111)
Creatinine, Ser: 0.94 mg/dL (ref 0.61–1.24)
GFR calc Af Amer: >60 mL/min (ref >60)
GFR calc non Af Amer: >60 mL/min (ref >60)
Glucose, Bld: 95 mg/dL (ref 70–99)
Potassium: 4.6 mmol/L (ref 3.5–5.1)
Sodium: 138 mmol/L (ref 135–145)

## 2020-05-19 LAB — CBC WITH DIFFERENTIAL/PLATELET
Abs Immature Granulocytes: 0.03 10*3/uL (ref 0.00–0.07)
Basophils Absolute: 0 10*3/uL (ref 0.0–0.1)
Basophils Relative: 0 %
Eosinophils Absolute: 0.1 10*3/uL (ref 0.0–0.5)
Eosinophils Relative: 1 %
HCT: 28.8 % — ABNORMAL LOW (ref 39.0–52.0)
Hemoglobin: 8.9 g/dL — ABNORMAL LOW (ref 13.0–17.0)
Immature Granulocytes: 0 %
Lymphocytes Relative: 14 %
Lymphs Abs: 1 10*3/uL (ref 0.7–4.0)
MCH: 24.5 pg — ABNORMAL LOW (ref 26.0–34.0)
MCHC: 30.9 g/dL (ref 30.0–36.0)
MCV: 79.3 fL — ABNORMAL LOW (ref 80.0–100.0)
Monocytes Absolute: 0.8 10*3/uL (ref 0.1–1.0)
Monocytes Relative: 12 %
Neutro Abs: 5.2 10*3/uL (ref 1.7–7.7)
Neutrophils Relative %: 73 %
Platelets: 203 10*3/uL (ref 150–400)
RBC: 3.63 MIL/uL — ABNORMAL LOW (ref 4.22–5.81)
RDW: 15.9 % — ABNORMAL HIGH (ref 11.5–15.5)
WBC: 7.2 10*3/uL (ref 4.0–10.5)
nRBC: 0 % (ref 0.0–0.2)

## 2020-05-19 LAB — FERRITIN: Ferritin: 214 ng/mL (ref 24–336)

## 2020-05-19 LAB — URINE CULTURE

## 2020-05-19 LAB — IRON AND TIBC
Iron: 193 ug/dL — ABNORMAL HIGH (ref 45–182)
Saturation Ratios: 85 % — ABNORMAL HIGH (ref 17.9–39.5)
TIBC: 228 ug/dL — ABNORMAL LOW (ref 250–450)
UIBC: 35 ug/dL

## 2020-05-19 LAB — SARS CORONAVIRUS 2 BY RT PCR (HOSPITAL ORDER, PERFORMED IN ~~LOC~~ HOSPITAL LAB): SARS Coronavirus 2: NEGATIVE

## 2020-05-19 MED ORDER — SODIUM CHLORIDE 0.9 % IV SOLN
2.0000 g | INTRAVENOUS | Status: AC
  Administered 2020-05-19 – 2020-05-21 (×3): 2 g via INTRAVENOUS
  Filled 2020-05-19: qty 20
  Filled 2020-05-19 (×2): qty 2

## 2020-05-19 MED ORDER — VANCOMYCIN HCL 750 MG/150ML IV SOLN: 750.0000 mg | INTRAVENOUS | Status: DC

## 2020-05-19 MED ORDER — OLMESARTAN MEDOXOMIL 20 MG PO TABS
20.0000 mg | ORAL_TABLET | Freq: Every day | ORAL | Status: DC
  Administered 2020-05-20 – 2020-05-22 (×2): 20 mg via ORAL
  Filled 2020-05-19 (×4): qty 1

## 2020-05-19 MED ORDER — VANCOMYCIN HCL IN DEXTROSE 1-5 GM/200ML-% IV SOLN
1000.0000 mg | INTRAVENOUS | Status: DC
  Administered 2020-05-19: 1000 mg via INTRAVENOUS
  Filled 2020-05-19: qty 200

## 2020-05-19 MED ORDER — AMLODIPINE BESYLATE 5 MG PO TABS
5.0000 mg | ORAL_TABLET | Freq: Every day | ORAL | Status: DC
  Administered 2020-05-19 – 2020-05-22 (×3): 5 mg via ORAL
  Filled 2020-05-19 (×4): qty 1

## 2020-05-19 NOTE — Progress Notes (Signed)
Pharmacy Antibiotic Note  Geoffrey Frank is a 86 y.o. male admitted on 05/18/2020 with cellulitis.  Pharmacy has been consulted for vancomycin dosing.  Plan:  Vancomycin 1000 mg given in ED and additional 500 mg ordered for initial loading dose of 1500 mg  Vancomycin 750 mg IV q24h  Ceftriaxone 1 gr IV q24 ( per MD for UTI)    Height: 6\' 1"  (185.4 cm) Weight: 70.3 kg (155 lb) IBW/kg (Calculated) : 79.9  Temp (24hrs), Avg:98.3 F (36.8 C), Min:98.2 F (36.8 C), Max:98.3 F (36.8 C)  Recent Labs  Lab 05/18/20 1850 05/18/20 2050  WBC 6.1  --   CREATININE 1.02  --   LATICACIDVEN 2.9* 2.8*    Estimated Creatinine Clearance: 45.9 mL/min (by C-G formula based on SCr of 1.02 mg/dL).    No Known Allergies     Thank you for allowing pharmacy to be a part of this patient's care. Antimicrobials this admission: 9/9 vancomycin >>  9/8 ceftriaxone >>   Dose adjustments this admission:   Microbiology results: 9/9 BCx:  9/9 UCx:  9/9 COVID-19: Negative  Royetta Asal, PharmD, BCPS 05/19/2020 12:38 AM

## 2020-05-19 NOTE — ED Notes (Signed)
Pt moved into hospital bed for comfort 

## 2020-05-19 NOTE — Progress Notes (Addendum)
Geoffrey Frank  GYI:[id number] DOB: March 30, 1926 DOA: 05/18/2020 PCP: Glendale Chard, MD    Brief Narrative:  86 year old with a history of COPD, prostate and bladder cancer, HTN, chronic bilateral lower extremity edema, and diastolic CHF who was sent to the ED from his PCPs office with concern for lower extremity infection.  Patient reported chronic swelling in his legs for over a year.  He was unclear when he first noticed a wound on his right shin.  The date of his admission he also appreciated another wound on his right second toe.  Reportedly he was evaluated by his PCP who was concerned that he had cellulitis and sent him to the ED.  In the ED CT of the lower extremities revealed subcutaneous edema but no evidence of deep tissue infection.  Clinical exam was in fact concerning for significant cellulitis.  Significant Events:  9/9 admit via [facility] emergency department  Vaccination Status: Vaccinated - last [facility] March 2021  Antimicrobials:  Ceftriaxone 9/9 > Vancomycin 9/9 >  DVT prophylaxis: Lovenox  Subjective: Vital signs are stable and oxygen saturations are 100% on room air.  In good spirits.  Denies chest pain or shortness of breath.  Reports that he is feeling better overall.  No abdominal pain nausea or vomiting.  Assessment & Plan:  Right lower extremity cellulitis Continue empiric antibiotic therapy  UTI -symptomatic Patient reported dysuria on history -should be covered with antibiotic being used for cellulitis -follow urine culture  Multiple frequent falls ROS revealed multiple frequent falls at home which appear to be mechanical in nature -PT/OT evaluations  Ulceration of foot -right No known history of trauma -obtain vascular studies when patient stable  Microcytic anemia of unclear etiology Iron studies suggestive of anemia of chronic disease  HTN Blood pressure currently well controlled  Chronic diastolic CHF No gross volume overload at  present  Code Status: NO CODE BLUE Family Communication:  Status is: Inpatient  Remains inpatient appropriate because:Inpatient level of care appropriate due to severity of illness   Dispo: The patient is from: Home              Anticipated d/c is to: unclear              Anticipated d/c date is: 2 days              Patient currently is not medically stable to d/c.   Admission is indicated for 1 or more of the following: Limb-threatening infection - at presentation there was signif concern for gangrene of the R foot/toe Clinical presentation (acuity of infection, rapidity of progression) - the pt was unsure of the timing and his R LE wounds appeared to have very acutely developed w/ severe surrounding erythema worrisome for vascular etiology or limb threatening infection w/ vascular compromise Inability to maintain oral hydration - patients oral intake was limited initially due to his overall poor feeling in the setting of his acute LE cellulitis - it was not felt he could safely or consistently be tx w/ oral abx given this fact (as well as the fact the severity of the cellulitis at presentation warranted IV tx)   Consultants:  none  Objective: Blood pressure 124/77, pulse 71, temperature 97.9 F (36.6 C), resp. rate 15, height 6\' 1"  (1.854 m), weight 70.3 kg, SpO2 100 %.  Intake/Output Summary (Last 24 hours) at 05/19/2020 0802 Last data filed at 05/19/2020 0421 Gross per 24 hour  Intake 2523 ml  Output 350 ml  Net 2173 ml   Filed Weights   05/18/20 1817  Weight: 70.3 kg    Examination: General: No acute respiratory distress Lungs: Clear to auscultation bilaterally without wheezes or crackles Cardiovascular: Regular rate and rhythm without murmur gallop or rub normal S1 and S2 Abdomen: Nontender, nondistended, soft, bowel sounds positive, no rebound, no ascites, no appreciable mass Extremities: No significant cyanosis, clubbing, or edema bilateral lower  extremities  CBC: Recent Labs  Lab 05/18/20 1850 05/19/20 0300  WBC 6.1 7.2  NEUTROABS 4.7 5.2  HGB 9.2* 8.9*  HCT 30.7* 28.8*  MCV 80.2 79.3*  PLT 227 852   Basic Metabolic Panel: Recent Labs  Lab 05/18/20 1850 05/19/20 0300  NA 142 138  K 4.1 4.6  CL 105 103  CO2 27 28  GLUCOSE 123* 95  BUN 30* 27*  CREATININE 1.02 0.94  CALCIUM 9.2 8.6*   GFR: Estimated Creatinine Clearance: 49.9 mL/min (by C-G formula based on SCr of 0.94 mg/dL).  Liver Function Tests: Recent Labs  Lab 05/18/20 1850  AST 20  ALT 18  ALKPHOS 40  BILITOT 0.7  PROT 6.0*  ALBUMIN 3.3*    HbA1C: Hgb A1c MFr Bld  Date/Time Value Ref Range Status  06/25/2018 03:36 PM 5.9 (H) 4.8 - 5.6 % Final    Comment:             Prediabetes: 5.7 - 6.4          Diabetes: >6.4          Glycemic control for adults with diabetes: <7.0   09/29/2017 01:47 PM 5.9 (H) 4.8 - 5.6 % Final    Comment:    (NOTE) Pre diabetes:          5.7%-6.4% Diabetes:              >6.4% Glycemic control for   <7.0% adults with diabetes     CBG: No results for input(s): GLUCAP in the last 168 hours.  Recent Results (from the past 240 hour(s))  SARS Coronavirus 2 by RT PCR (hospital order, performed in [facility] hospital lab) Nasopharyngeal Nasopharyngeal Swab     Status: None   Collection Time: 05/18/20 10:46 PM   Specimen: Nasopharyngeal Swab  Result Value Ref Range Status   SARS Coronavirus 2 NEGATIVE NEGATIVE Final    Comment: (NOTE) SARS-CoV-2 target nucleic acids are NOT DETECTED.  The SARS-CoV-2 RNA is generally detectable in upper and lower respiratory specimens during the acute phase of infection. The lowest concentration of SARS-CoV-2 viral copies this assay can detect is 250 copies / mL. A negative result does not preclude SARS-CoV-2 infection and should not be used as the sole basis for treatment or other patient management decisions.  A negative result may occur with improper specimen collection /  handling, submission of specimen other than nasopharyngeal swab, presence of viral mutation(s) within the areas targeted by this assay, and inadequate number of viral copies (<250 copies / mL). A negative result must be combined with clinical observations, patient history, and epidemiological information.  Fact Sheet for Patients:   StrictlyIdeas.no  Fact Sheet for Healthcare Providers: BankingDealers.co.za  This test is not yet approved or  cleared by the [place] FDA and has been authorized for detection and/or diagnosis of SARS-CoV-2 by FDA under an Emergency Use Authorization (EUA).  This EUA will remain in effect (meaning this test can be used) for the duration of the COVID-19 declaration under Section 564(b)(1) of the Act, 21 U.S.C. section  360bbb-3(b)(1), unless the authorization is terminated or revoked sooner.  Performed at [facility], [place] [address] [place], [place] [address]      Scheduled Meds: . amLODipine  5 mg Oral Daily   And  . olmesartan  20 mg Oral Daily  . aspirin EC  81 mg Oral Daily  . enoxaparin (LOVENOX) injection  40 mg Subcutaneous Q24H  . furosemide  40 mg Oral Daily  . linaclotide  145 mcg Oral QAC breakfast  . metoprolol tartrate  25 mg Oral BID  . potassium chloride SA  20 mEq Oral Daily  . sodium chloride flush  3 mL Intravenous Q12H   Continuous Infusions: . cefTRIAXone (ROCEPHIN)  IV Stopped (05/18/20 2255)  . vancomycin       LOS: 1 day   Cherene Altes, MD [facility] Office  [phone] Pager - Text Page per Amion  If 7PM-7AM, please contact night-coverage per Amion 05/19/2020, 8:02 AM

## 2020-05-19 NOTE — ED Notes (Signed)
Brief and chux changed, pt repositioned in bed

## 2020-05-19 NOTE — Evaluation (Signed)
Occupational Therapy Evaluation Patient Details Name: Geoffrey Frank MRN: [id number] DOB: 05/16/1926 Today's Date: 05/19/2020    History of Present Illness 86 y/o M sent to the ED from his PCPs office with concern for lower extremity infection.  Patient reported chronic swelling in his legs for over a year. In the ED CT of the lower extremities revealed subcutaneous edema but no evidence of deep tissue infection.  Clinical exam was in fact concerning for significant cellulitis.   Clinical Impression   An occupational therapy evaluation was completed on this 86 year old, right handed,  male with pertinent past medical history of COPD, RT THA, chronic LE edema, prostate and bladder CA, HTN, as well as PMH in chart. Patient is currently requiring assistance with ADLs including Total Assist with LE dressing and toileting, mod-maximum assist with bathing, and contact guard to SBA with UE dressing and seated grooming, all of which is below patient's typical baseline of being modified Independent.  During this evaluation, patient was limited by generalized weakness, mild confusion, which has the potential to impact patient's safety and independence during functional mobility, as well as performance for ADLs. ?[place] "6-clicks" Daily Activity Inpatient Short Form score of 14/24 indicates severe ADL impairment this session. Patient lives alone in a 1st floor apartment and has a housekeeper 2x/month. Pt depends on granddaughter and niece for transporation but denies any assistance available for his basic day to day ADLs.  Patient is currently unsteady on feet and a high fall risk. He demonstrates good rehab potential, and should benefit from continued skilled occupational therapy services while in acute care to maximize safety, independence and quality of life at home.  Continued occupational therapy services in a SNF setting prior to return home is recommended.  ?    Follow Up  Recommendations  SNF;Supervision/Assistance - 24 hour    Equipment Recommendations  3 in 1 bedside commode    Recommendations for Other Services       Precautions / Restrictions Precautions Precautions: Fall Restrictions Weight Bearing Restrictions: No Other Position/Activity Restrictions: RT Great toe wound      Mobility Bed Mobility Overal bed mobility: Needs Assistance Bed Mobility: Supine to Sit;Sit to Supine     Supine to sit: Mod assist Sit to supine: Mod assist   General bed mobility comments: Supine to sit required Mod As at trunk and to fully advance LEs off EOB.  Sit to supine required assist to lift LEs onto bed.  Transfers Overall transfer level: Needs assistance Equipment used: Rolling walker (2 wheeled) Transfers: Sit to/from Stand Sit to Stand: Mod assist;From elevated surface         General transfer comment: Cues for hand placement and upright posture.    Balance Overall balance assessment: History of Falls;Needs assistance Sitting-balance support: Bilateral upper extremity supported Sitting balance-Leahy Scale: Fair   Postural control: Posterior lean Standing balance support: Bilateral upper extremity supported Standing balance-Leahy Scale: Poor               High level balance activites: Side stepping High Level Balance Comments: with RW ~4 steps to RT with Mod As.           ADL either performed or assessed with clinical judgement   ADL Overall ADL's : Needs assistance/impaired Eating/Feeding: Bed level;Independent   Grooming: Bed level;Wash/dry hands;Set up   Upper Body Bathing: Min guard;Sitting;Set up   Lower Body Bathing: Total assistance;Bed level;Sitting/lateral leans   Upper Body Dressing : Minimal assistance;Sitting  Lower Body Dressing: Sitting/lateral leans;Bed level;Total assistance Lower Body Dressing Details (indicate cue type and reason): Total Assist needed to don socks while sitting EOB.   Toilet Transfer  Details (indicate cue type and reason): Did not pivot to commode. Please see Mobility section for transfers. Toileting- Clothing Manipulation and Hygiene: Bed level;Total assistance Toileting - Clothing Manipulation Details (indicate cue type and reason): Pt incontinent of bowel and bladder. Requires total assist for peri-care based on general assessment.     Functional mobility during ADLs: Moderate assistance;Rolling walker;Cueing for safety;Cueing for sequencing       Vision   Vision Assessment?: No apparent visual deficits     Perception     Praxis      Pertinent Vitals/Pain Pain Assessment: No/denies pain (reports that his wounds sting)     Hand Dominance Right   Extremity/Trunk Assessment Upper Extremity Assessment Upper Extremity Assessment: Overall WFL for tasks assessed   Lower Extremity Assessment Lower Extremity Assessment: Defer to PT evaluation   Cervical / Trunk Assessment Cervical / Trunk Assessment: Kyphotic   Communication Communication Communication: No difficulties   Cognition Arousal/Alertness: Awake/alert Behavior During Therapy: WFL for tasks assessed/performed (very pleasant) Overall Cognitive Status: No family/caregiver present to determine baseline cognitive functioning                                 General Comments: Pt is oriented to person, place and situation.  Disoriented to month, stating May and to year.   General Comments       Exercises     Shoulder Instructions      Home Living Family/patient expects to be discharged to:: Private residence Living Arrangements: Alone Available Help at Discharge: Available PRN/intermittently;Family Type of Home: Apartment Home Access: Level entry     Home Layout: One level     Bathroom Shower/Tub: Teacher, early years/pre: Standard     Home Equipment: Environmental consultant - 2 wheels;Walker - 4 wheels;Shower seat   Additional Comments: Reacher and long shoe horn. Pt sleep in  a regular bed.      Prior Functioning/Environment Level of Independence: Independent with assistive device(s)        Comments: Pt has a housekeeper 2x/ month. Pt does not drive and depends on granddaughter or niece for transportation.  Pt reports intermittent urinary incontinence and wears Depends at baseline.        OT Problem List: Decreased strength;Decreased cognition;Increased edema;Decreased activity tolerance;Decreased safety awareness;Impaired balance (sitting and/or standing);Decreased knowledge of use of DME or AE;Decreased knowledge of precautions      OT Treatment/Interventions: Self-care/ADL training;Therapeutic activities;Cognitive remediation/compensation;Energy conservation;DME and/or AE instruction;Patient/family education;Balance training    OT Goals(Current goals can be found in the care plan section) Acute Rehab OT Goals Patient Stated Goal: Stop falling OT Goal Formulation: With patient Time For Goal Achievement: 06/02/20 Potential to Achieve Goals: Good ADL Goals Pt Will Perform Grooming: sitting;standing;with set-up;with supervision Pt Will Perform Upper Body Dressing: sitting;with supervision;with set-up Pt Will Perform Lower Body Dressing: with set-up;with supervision;with adaptive equipment;sitting/lateral leans;sit to/from stand Pt Will Transfer to Toilet: with modified independence;ambulating;regular height toilet Pt Will Perform Toileting - Clothing Manipulation and hygiene: with supervision;sit to/from stand;sitting/lateral leans;with adaptive equipment Additional ADL Goal #1: Pt will identify at least 3 fall prevention techniques to prevent injury at home and rehospitalization.  OT Frequency: Min 2X/week   Barriers to D/C: Decreased caregiver support  Pt reports that family cannot be  available 24/7       Co-evaluation              AM-PAC OT "6 Clicks" Daily Activity     Outcome Measure Help from another person eating meals?: None Help from  another person taking care of personal grooming?: A Little Help from another person toileting, which includes using toliet, bedpan, or urinal?: Total Help from another person bathing (including washing, rinsing, drying)?: A Lot Help from another person to put on and taking off regular upper body clothing?: A Little Help from another person to put on and taking off regular lower body clothing?: Total 6 Click Score: 14   End of Session Equipment Utilized During Treatment: Gait belt;Rolling walker Nurse Communication: Mobility status  Activity Tolerance: Patient tolerated treatment well Patient left: in bed;with call bell/phone within reach  OT Visit Diagnosis: Unsteadiness on feet (R26.81);Repeated falls (R29.6);History of falling (Z91.81)                Time: 2111-5520 OT Time Calculation (min): 35 min Charges:  OT General Charges $OT Visit: 1 Visit OT Evaluation $OT Eval Low Complexity: 1 Low OT Treatments $Therapeutic Activity: 8-22 mins  Anderson Malta, OT Acute Rehab Services Office: [phone] 05/19/2020   Julien Girt 05/19/2020, 9:47 AM

## 2020-05-19 NOTE — Evaluation (Signed)
Physical Therapy Evaluation Patient Details Name: Geoffrey Frank MRN: [id number] DOB: [birth year] Today's Date: 05/19/2020   History of Present Illness  86 y/o M sent to the ED from his PCPs office with concern for lower extremity infection.  Patient reported chronic swelling in his legs for over a year. In the ED CT of the lower extremities revealed subcutaneous edema but no evidence of deep tissue infection.  Clinical exam was in fact concerning for significant cellulitis.  Clinical Impression  Pt admitted as above and presenting with functional mobility limitations 2* generalized weakness, limited endurance and significant ambulatory balance deficits.  Pt would benefit from follow up SNF level rehab prior to return home with limited assist.    Follow Up Recommendations SNF    Equipment Recommendations  None recommended by PT    Recommendations for Other Services       Precautions / Restrictions Precautions Precautions: Fall Restrictions Weight Bearing Restrictions: No Other Position/Activity Restrictions: RT Great toe wound      Mobility  Bed Mobility Overal bed mobility: Needs Assistance Bed Mobility: Supine to Sit     Supine to sit: Min assist     General bed mobility comments: increased time with steady assist for trunk and min assist to wt shift and advance to EOB  Transfers Overall transfer level: Needs assistance Equipment used: Rolling walker (2 wheeled) Transfers: Sit to/from Stand Sit to Stand: Mod assist;+2 physical assistance;+2 safety/equipment         General transfer comment: cues for use of UEs to self assist.  Physical assist to bring wt up and fwd  Ambulation/Gait Ambulation/Gait assistance: Min assist;Mod assist;+2 safety/equipment Gait Distance (Feet): 10 Feet Assistive device: Rolling walker (2 wheeled) Gait Pattern/deviations: Step-to pattern;Step-through pattern;Decreased step length - right;Decreased step length - left;Shuffle;Trunk  flexed;Narrow base of support Gait velocity: decr   General Gait Details: cues for posture, position from RW; physical assist for RW management and balance  Stairs            Wheelchair Mobility    Modified Rankin (Stroke Patients Only)       Balance Overall balance assessment: History of Falls;Needs assistance Sitting-balance support: No upper extremity supported Sitting balance-Leahy Scale: Fair     Standing balance support: Bilateral upper extremity supported Standing balance-Leahy Scale: Poor                               Pertinent Vitals/Pain Pain Assessment: No/denies pain    Home Living Family/patient expects to be discharged to:: Unsure Living Arrangements: Alone Available Help at Discharge: Available PRN/intermittently;Family Type of Home: Apartment Home Access: Level entry     Home Layout: One level Home Equipment: [place] - 2 wheels;Walker - 4 wheels;Shower seat Additional Comments: Reacher and long shoe horn. Pt sleep in a regular bed.    Prior Function Level of Independence: Independent with assistive device(s)         Comments: Pt has a housekeeper 2x/ month. Pt does not drive and depends on granddaughter or niece for transportation.  Pt reports intermittent urinary incontinence and wears Depends at baseline.     Hand Dominance   Dominant Hand: Right    Extremity/Trunk Assessment   Upper Extremity Assessment Upper Extremity Assessment: Generalized weakness    Lower Extremity Assessment Lower Extremity Assessment: Generalized weakness;RLE deficits/detail RLE Deficits / Details: pt reports foot drop x ~20 yrs    Cervical / Trunk Assessment Cervical / Trunk  Assessment: Kyphotic  Communication   Communication: No difficulties  Cognition Arousal/Alertness: Awake/alert Behavior During Therapy: WFL for tasks assessed/performed Overall Cognitive Status: No family/caregiver present to determine baseline cognitive functioning                                  General Comments: Pt is oriented to person, place and situation.      General Comments      Exercises     Assessment/Plan    PT Assessment Patient needs continued PT services  PT Problem List Decreased strength;Decreased activity tolerance;Decreased balance;Decreased mobility;Decreased knowledge of use of DME       PT Treatment Interventions DME instruction;Gait training;Functional mobility training;Therapeutic activities;Therapeutic exercise;Balance training;Patient/family education    PT Goals (Current goals can be found in the Care Plan section)  Acute Rehab PT Goals Patient Stated Goal: Stop falling PT Goal Formulation: With patient Time For Goal Achievement: 06/02/20 Potential to Achieve Goals: Fair    Frequency Min 3X/week   Barriers to discharge Decreased caregiver support Pt vague on family ability to assist    Co-evaluation               AM-PAC PT "6 Clicks" Mobility  Outcome Measure Help needed turning from your back to your side while in a flat bed without using bedrails?: A Little Help needed moving from lying on your back to sitting on the side of a flat bed without using bedrails?: A Lot Help needed moving to and from a bed to a chair (including a wheelchair)?: A Lot Help needed standing up from a chair using your arms (e.g., wheelchair or bedside chair)?: A Lot Help needed to walk in hospital room?: A Little Help needed climbing 3-5 steps with a railing? : A Lot 6 Click Score: 14    End of Session Equipment Utilized During Treatment: Gait belt Activity Tolerance: Patient tolerated treatment well;Patient limited by fatigue Patient left: in chair;with call bell/phone within reach;with chair alarm set Nurse Communication: Mobility status PT Visit Diagnosis: Unsteadiness on feet (R26.81);Muscle weakness (generalized) (M62.81);Difficulty in walking, not elsewhere classified (R26.2)    Time: 7482-7078 PT  Time Calculation (min) (ACUTE ONLY): 22 min   Charges:   PT Evaluation $PT Eval Low Complexity: 1 Low          [facility] Pager [phone] Office [phone]   Concetta Guion 05/19/2020, 4:59 PM

## 2020-05-20 LAB — CBC
HCT: 25.1 % — ABNORMAL LOW (ref 39.0–52.0)
Hemoglobin: 8.5 g/dL — ABNORMAL LOW (ref 13.0–17.0)
MCH: 28.5 pg (ref 26.0–34.0)
MCHC: 33.9 g/dL (ref 30.0–36.0)
MCV: 84.2 fL (ref 80.0–100.0)
Platelets: 144 10*3/uL — ABNORMAL LOW (ref 150–400)
RBC: 2.98 MIL/uL — ABNORMAL LOW (ref 4.22–5.81)
RDW: 16.3 % — ABNORMAL HIGH (ref 11.5–15.5)
WBC: 14.6 10*3/uL — ABNORMAL HIGH (ref 4.0–10.5)
nRBC: 0 % (ref 0.0–0.2)

## 2020-05-20 LAB — COMPREHENSIVE METABOLIC PANEL
ALT: 12 U/L (ref 0–44)
AST: 21 U/L (ref 15–41)
Albumin: 4.6 g/dL (ref 3.5–5.0)
Alkaline Phosphatase: 61 U/L (ref 38–126)
Anion gap: 13 (ref 5–15)
BUN: 12 mg/dL (ref 8–23)
CO2: 26 mmol/L (ref 22–32)
Calcium: 9.2 mg/dL (ref 8.9–10.3)
Chloride: 102 mmol/L (ref 98–111)
Creatinine, Ser: 0.87 mg/dL (ref 0.61–1.24)
GFR calc Af Amer: >60 mL/min (ref >60)
GFR calc non Af Amer: >60 mL/min (ref >60)
Glucose, Bld: 112 mg/dL — ABNORMAL HIGH (ref 70–99)
Potassium: 3.5 mmol/L (ref 3.5–5.1)
Sodium: 141 mmol/L (ref 135–145)
Total Bilirubin: 2.2 mg/dL — ABNORMAL HIGH (ref 0.3–1.2)
Total Protein: 7.5 g/dL (ref 6.5–8.1)

## 2020-05-20 NOTE — Progress Notes (Signed)
Geoffrey Frank  UMP:[id number] DOB: 07-Jun-1926 DOA: 05/18/2020 PCP: Glendale Chard, MD    Brief Narrative:  [phone] with a history of COPD, prostate and bladder cancer, HTN, chronic bilateral lower extremity edema, and diastolic CHF who was sent to the ED from his PCPs office with concern for lower extremity infection.  Patient reported chronic swelling in his legs for over a year.  He was unclear when he first noticed a wound on his right shin.  The date of his admission he also appreciated another wound on his right second toe.  Reportedly he was evaluated by his PCP who was concerned that he had cellulitis and sent him to the ED.  In the ED CT of the lower extremities revealed subcutaneous edema but no evidence of deep tissue infection.  Clinical exam was concerning for cellulitis.  Significant Events:  9/9 admit via [facility] emergency department  Vaccination Status: Vaccinated - last [facility] March 2021  Antimicrobials:  Ceftriaxone 9/9 > Vancomycin 9/9 >  DVT prophylaxis: Lovenox  Subjective: Afebrile with vital signs stable.  Saturations 99% on room air.  Urine culture unrevealing.  Alert and pleasant.  Agrees with SNF placement for ongoing care and rehabilitation.  Assessment & Plan:  Right lower extremity cellulitis Continue empiric antibiotic therapy -narrow antibiotic spectrum given clinical improvement  UTI - symptomatic Patient reported dysuria on history -should be covered with antibiotic being used for cellulitis -urine culture has not proven to be helpful  Dehydration Corrected with volume resuscitation  Multiple frequent falls ROS revealed multiple frequent falls at home which appear to be mechanical in nature -PT/OT suggest SNF placement most appropriate at this time  Ulceration of foot - right No known history of trauma -monitor for now and if wounds fail to heal with proper therapy can consider vascular work-up as an outpatient, the patient may not be a  candidate for interventions if he does have vascular disease  Microcytic anemia of unclear etiology Iron studies suggestive of anemia of chronic disease -hemoglobin holding steady  HTN Blood pressure currently well controlled  Chronic diastolic CHF No gross volume overload at present  Code Status: NO CODE BLUE Family Communication:  Status is: Inpatient  Remains inpatient appropriate because:Inpatient level of care appropriate due to severity of illness   Dispo: The patient is from: Home              Anticipated d/c is to: unclear              Anticipated d/c date is: 2 days              Patient currently is not medically stable to d/c.  Consultants:  none  Objective: Blood pressure (!) 143/99, pulse 80, temperature (!) 97.4 F (36.3 C), temperature source Oral, resp. rate 16, height 6\' 1"  (1.854 m), weight 70.3 kg, SpO2 99 %.  Intake/Output Summary (Last 24 hours) at 05/20/2020 0933 Last data filed at 05/20/2020 0600 Gross per 24 hour  Intake 890.07 ml  Output 300 ml  Net 590.07 ml   Filed Weights   05/18/20 1817  Weight: 70.3 kg    Examination: General: No acute respiratory distress Lungs: CTA B without wheezing Cardiovascular: Regular rate and rhythm without murmur  Abdomen: Nontender, nondistended, soft, bowel sounds positive, no rebound, no ascites, no appreciable mass Extremities: No significant cyanosis, clubbing, or edema bilateral lower extremities -change of chronic venous stasis dermatitis appreciable bilateral lower extremities  CBC: Recent Labs  Lab 05/18/20 1850  05/19/20 0300 05/20/20 0615  WBC 6.1 7.2 14.6*  NEUTROABS 4.7 5.2  --   HGB 9.2* 8.9* 8.5*  HCT 30.7* 28.8* 25.1*  MCV 80.2 79.3* 84.2  PLT 227 203 814*   Basic Metabolic Panel: Recent Labs  Lab 05/18/20 1850 05/19/20 0300 05/20/20 0615  NA 142 138 141  K 4.1 4.6 3.5  CL 105 103 102  CO2 27 28 26   GLUCOSE 123* 95 112*  BUN 30* 27* 12  CREATININE 1.02 0.94 0.87  CALCIUM 9.2  8.6* 9.2   GFR: Estimated Creatinine Clearance: 53.9 mL/min (by C-G formula based on SCr of 0.87 mg/dL).  Liver Function Tests: Recent Labs  Lab 05/18/20 1850 05/20/20 0615  AST 20 21  ALT 18 12  ALKPHOS 40 61  BILITOT 0.7 2.2*  PROT 6.0* 7.5  ALBUMIN 3.3* 4.6    HbA1C: Hgb A1c MFr Bld  Date/Time Value Ref Range Status  06/25/2018 03:36 PM 5.9 (H) 4.8 - 5.6 % Final    Comment:             Prediabetes: 5.7 - 6.4          Diabetes: >6.4          Glycemic control for adults with diabetes: <7.0   09/29/2017 01:47 PM 5.9 (H) 4.8 - 5.6 % Final    Comment:    (NOTE) Pre diabetes:          5.7%-6.4% Diabetes:              >6.4% Glycemic control for   <7.0% adults with diabetes     CBG: No results for input(s): GLUCAP in the last 168 hours.  Recent Results (from the past 240 hour(s))  Culture, Urine     Status: Abnormal   Collection Time: 05/18/20 10:38 PM   Specimen: Urine, Clean Catch  Result Value Ref Range Status   Specimen Description   Final    URINE, CLEAN CATCH Performed at [facility], [place] [address], [place], [place] [address]    Special Requests   Final    NONE Performed at [facility], [place] [address] [place], [place] [address]    Culture MULTIPLE SPECIES PRESENT, SUGGEST RECOLLECTION (A)  Final   Report Status 05/19/2020 FINAL  Final  SARS Coronavirus 2 by RT PCR (hospital order, performed in [facility] hospital lab) Nasopharyngeal Nasopharyngeal Swab     Status: None   Collection Time: 05/18/20 10:46 PM   Specimen: Nasopharyngeal Swab  Result Value Ref Range Status   SARS Coronavirus 2 NEGATIVE NEGATIVE Final    Comment: (NOTE) SARS-CoV-2 target nucleic acids are NOT DETECTED.  The SARS-CoV-2 RNA is generally detectable in upper and lower respiratory specimens during the acute phase of infection. The lowest concentration of SARS-CoV-2 viral copies this assay can detect is 250 copies / mL. A negative result  does not preclude SARS-CoV-2 infection and should not be used as the sole basis for treatment or other patient management decisions.  A negative result may occur with improper specimen collection / handling, submission of specimen other than nasopharyngeal swab, presence of viral mutation(s) within the areas targeted by this assay, and inadequate number of viral copies (<250 copies / mL). A negative result must be combined with clinical observations, patient history, and epidemiological information.  Fact Sheet for Patients:   StrictlyIdeas.no  Fact Sheet for Healthcare Providers: BankingDealers.co.za  This test is not yet approved or  cleared by the [place]  FDA and has been authorized for detection and/or diagnosis of SARS-CoV-2 by FDA under an Emergency Use Authorization (EUA).  This EUA will remain in effect (meaning this test can be used) for the duration of the COVID-19 declaration under Section 564(b)(1) of the Act, 21 U.S.C. section 360bbb-3(b)(1), unless the authorization is terminated or revoked sooner.  Performed at [facility], [place] [address], [place], [place] [address]      Scheduled Meds: . amLODipine  5 mg Oral Daily   And  . olmesartan  20 mg Oral Daily  . aspirin EC  81 mg Oral Daily  . enoxaparin (LOVENOX) injection  40 mg Subcutaneous Q24H  . furosemide  40 mg Oral Daily  . linaclotide  145 mcg Oral QAC breakfast  . metoprolol tartrate  25 mg Oral BID  . potassium chloride SA  20 mEq Oral Daily  . sodium chloride flush  3 mL Intravenous Q12H   Continuous Infusions: . cefTRIAXone (ROCEPHIN)  IV 2 g (05/19/20 1720)  . vancomycin 1,000 mg (05/19/20 2140)     LOS: 2 days   Cherene Altes, MD [facility] Office  [phone] Pager - Text Page per Amion  If 7PM-7AM, please contact night-coverage per Amion 05/20/2020, 9:33 AM

## 2020-05-20 NOTE — TOC Initial Note (Addendum)
Transition of Care [facility]) - Initial/Assessment Note    Patient Details  Name: Geoffrey Frank MRN: [id number] Date of Birth: June 08, 1926  Transition of Care [facility]) CM/SW Contact:    Trish Mage, LCSW Phone Number: 05/20/2020, 3:19 PM  Clinical Narrative:   Patient seen in response to PT recommendation of SNF based on general weakness and multiple falls in the home.  Mr Geoffrey Frank appears to be sharp as a tack, has good ears and eyes for a 86 year old, and nicely tells me he has no intention of going to short term rehab.  States he has someone coming into the home "twice a month to clean, and will pay her to come more often."  Grandaughter showed up, and with his permission we included her in the conversation.  I explained in depth the advantage of going to rehab, and the limitations of [place] services. Mr Geoffrey Frank continues to say that he will pay for someone to come into the home for more hours to help out.  When grandaughter pushed him a bit on going to rehab, he replied that he comes to these kind of decisions slowly.  He is not willing to allow me to send out his information today for a bed.  I offered a list of personal care service agencies to granddaughter, and she gratefully accepted them. TOC will continue to follow during the course of hospitalization.                 Expected Discharge Plan: [place] Barriers to Discharge: No Barriers Identified   Patient Goals and CMS Choice Patient states their goals for this hospitalization and ongoing recovery are:: "I reckon I'll go home."      Expected Discharge Plan and Services Expected Discharge Plan: [place]   Discharge Planning Services: CM Consult Post Acute Care Choice: [place] arrangements for the past 2 months: Apartment                                      Prior Living Arrangements/Services Living arrangements for the past 2 months: Apartment Lives with:: Self Patient  language and need for interpreter reviewed:: Yes Do you feel safe going back to the place where you live?: Yes      Need for Family Participation in Patient Care: Yes (Comment) Care giver support system in place?: Yes (comment) Current home services: DME Criminal Activity/Legal Involvement Pertinent to Current Situation/Hospitalization: No - Comment as needed  Activities of Daily Living Home Assistive Devices/Equipment: Eyeglasses, Environmental consultant (specify type), Brace (specify type), Other (Comment) (bilateral lower extremity leg braces, front wheeled walker) ADL Screening (condition at time of admission) Patient's cognitive ability adequate to safely complete daily activities?: Yes Is the patient deaf or have difficulty hearing?: Yes (very [place]) Does the patient have difficulty seeing, even when wearing glasses/contacts?: No Does the patient have difficulty concentrating, remembering, or making decisions?: Yes Patient able to express need for assistance with ADLs?: Yes Does the patient have difficulty dressing or bathing?: Yes Independently performs ADLs?: No Communication: Independent Dressing (OT): Needs assistance Is this a change from baseline?: Change from baseline, expected to last >3 days Grooming: Needs assistance Is this a change from baseline?: Change from baseline, expected to last >3 days Feeding: Needs assistance Is this a change from baseline?: Change from baseline, expected to last >3 days Bathing: Needs assistance Is  this a change from baseline?: Change from baseline, expected to last >3 days Toileting: Needs assistance Is this a change from baseline?: Change from baseline, expected to last >3days In/Out Bed: Needs assistance Is this a change from baseline?: Change from baseline, expected to last >3 days Walks in Home: Needs assistance Is this a change from baseline?: Change from baseline, expected to last >3 days Does the patient have difficulty walking or climbing stairs?:  Yes (does not do stairs) Weakness of Legs: Both Weakness of Arms/Hands: None  Permission Sought/Granted Permission sought to share information with : Family Supports Permission granted to share information with : Yes, Verbal Permission Granted  Share Information with NAME: Maxwell,Dequise (Granddaughter) [phone]           Emotional Assessment Appearance:: Appears stated age Attitude/Demeanor/Rapport: Engaged Affect (typically observed): Appropriate Orientation: : Oriented to Self, Oriented to Place, Oriented to  Time, Oriented to Situation Alcohol / Substance Use: Not Applicable Psych Involvement: No (comment)  Admission diagnosis:  Cellulitis [L03.90] Patient Active Problem List   Diagnosis Date Noted  . Benign hypertensive renal disease 05/18/2020  . Chronic renal disease, stage II 05/18/2020  . Chronic diastolic heart failure ([place]) 05/18/2020  . Cellulitis 05/18/2020  . Bilateral lower extremity edema 03/13/2017  . SVT (supraventricular tachycardia) ([place]) 03/25/2016  . Epistaxis 03/08/2015  . Essential hypertension 02/01/2014  . Chest pain 02/01/2014  . Bladder cancer ([place]) 10/14/2011  . CHRONIC RHINITIS 09/17/2007  . COPD (chronic obstructive pulmonary disease) ([place]) 09/17/2007  . DEGENERATIVE DISC DISEASE 09/17/2007  . PROSTATE CANCER, HX OF 09/17/2007   PCP:  Glendale Chard, MD Pharmacy:   [id number] - [place], [place] [address] Phone: [phone] Fax: [phone]     Social Determinants of Health (SDOH) Interventions    Readmission Risk Interventions No flowsheet data found.

## 2020-05-20 NOTE — Progress Notes (Signed)
Physical Therapy Treatment Patient Details Name: Geoffrey Frank MRN: [id number] DOB: Oct 27, 1925 Today's Date: 05/20/2020    History of Present Illness 86 y/o M sent to the ED from his PCPs office with concern for lower extremity infection.  Patient reported chronic swelling in his legs for over a year. In the ED CT of the lower extremities revealed subcutaneous edema but no evidence of deep tissue infection.  Clinical exam was in fact concerning for significant cellulitis.    PT Comments    Pt in fair spirits and cooperative this am.  Pt progressing slowly with mobility but noted to ambulate increased distance this date and with decreased assist level required for all tasks.   Follow Up Recommendations  SNF     Equipment Recommendations  None recommended by PT    Recommendations for Other Services       Precautions / Restrictions Precautions Precautions: Fall Restrictions Weight Bearing Restrictions: No Other Position/Activity Restrictions: RT Great toe wound    Mobility  Bed Mobility Overal bed mobility: Needs Assistance Bed Mobility: Supine to Sit     Supine to sit: Min guard     General bed mobility comments: increased time but no physical assist  Transfers Overall transfer level: Needs assistance Equipment used: Rolling walker (2 wheeled) Transfers: Sit to/from Stand Sit to Stand: Min assist;Mod assist;+2 physical assistance;+2 safety/equipment;From elevated surface         General transfer comment: cues for use of UEs to self assist.  Physical assist to bring wt up and fwd  Ambulation/Gait Ambulation/Gait assistance: Min assist;Mod assist;+2 safety/equipment Gait Distance (Feet): 21 Feet Assistive device: Rolling walker (2 wheeled) Gait Pattern/deviations: Step-to pattern;Step-through pattern;Decreased step length - right;Decreased step length - left;Shuffle;Trunk flexed;Narrow base of support Gait velocity: decr   General Gait Details: cues for  posture, position from RW, and to increase BOS; physical assist for RW management and balance   Stairs             Wheelchair Mobility    Modified Rankin (Stroke Patients Only)       Balance Overall balance assessment: History of Falls;Needs assistance Sitting-balance support: No upper extremity supported Sitting balance-Leahy Scale: Fair   Postural control: Posterior lean Standing balance support: Bilateral upper extremity supported Standing balance-Leahy Scale: Poor                              Cognition Arousal/Alertness: Awake/alert Behavior During Therapy: WFL for tasks assessed/performed Overall Cognitive Status: No family/caregiver present to determine baseline cognitive functioning                                 General Comments: Pt is oriented to person, place and situation.      Exercises      General Comments        Pertinent Vitals/Pain Pain Assessment: No/denies pain    Home Living                      Prior Function            PT Goals (current goals can now be found in the care plan section) Acute Rehab PT Goals Patient Stated Goal: Stop falling PT Goal Formulation: With patient Time For Goal Achievement: 06/02/20 Potential to Achieve Goals: Fair Progress towards PT goals: Progressing toward goals    Frequency  Min 3X/week      PT Plan Current plan remains appropriate    Co-evaluation              AM-PAC PT "6 Clicks" Mobility   Outcome Measure  Help needed turning from your back to your side while in a flat bed without using bedrails?: A Little Help needed moving from lying on your back to sitting on the side of a flat bed without using bedrails?: A Lot Help needed moving to and from a bed to a chair (including a wheelchair)?: A Lot Help needed standing up from a chair using your arms (e.g., wheelchair or bedside chair)?: A Lot Help needed to walk in hospital room?: A  Little Help needed climbing 3-5 steps with a railing? : A Lot 6 Click Score: 14    End of Session Equipment Utilized During Treatment: Gait belt Activity Tolerance: Patient tolerated treatment well;Patient limited by fatigue Patient left: in chair;with call bell/phone within reach;with chair alarm set Nurse Communication: Mobility status PT Visit Diagnosis: Unsteadiness on feet (R26.81);Muscle weakness (generalized) (M62.81);Difficulty in walking, not elsewhere classified (R26.2)     Time: 8184-0375 PT Time Calculation (min) (ACUTE ONLY): 17 min  Charges:  $Gait Training: 8-22 mins                     [place] Pager [phone] Office [phone]    [place] 05/20/2020, 12:25 PM

## 2020-05-21 LAB — CBC
HCT: 28 % — ABNORMAL LOW (ref 39.0–52.0)
Hemoglobin: 8.5 g/dL — ABNORMAL LOW (ref 13.0–17.0)
MCH: 24.1 pg — ABNORMAL LOW (ref 26.0–34.0)
MCHC: 30.4 g/dL (ref 30.0–36.0)
MCV: 79.3 fL — ABNORMAL LOW (ref 80.0–100.0)
Platelets: 211 10*3/uL (ref 150–400)
RBC: 3.53 MIL/uL — ABNORMAL LOW (ref 4.22–5.81)
RDW: 15.7 % — ABNORMAL HIGH (ref 11.5–15.5)
WBC: 4.9 10*3/uL (ref 4.0–10.5)
nRBC: 0 % (ref 0.0–0.2)

## 2020-05-21 LAB — BASIC METABOLIC PANEL
Anion gap: 11 (ref 5–15)
BUN: 25 mg/dL — ABNORMAL HIGH (ref 8–23)
CO2: 28 mmol/L (ref 22–32)
Calcium: 8.8 mg/dL — ABNORMAL LOW (ref 8.9–10.3)
Chloride: 99 mmol/L (ref 98–111)
Creatinine, Ser: 0.81 mg/dL (ref 0.61–1.24)
GFR calc Af Amer: >60 mL/min (ref >60)
GFR calc non Af Amer: >60 mL/min (ref >60)
Glucose, Bld: 86 mg/dL (ref 70–99)
Potassium: 3.8 mmol/L (ref 3.5–5.1)
Sodium: 138 mmol/L (ref 135–145)

## 2020-05-21 MED ORDER — AMOXICILLIN-POT CLAVULANATE 875-125 MG PO TABS
1.0000 | ORAL_TABLET | Freq: Two times a day (BID) | ORAL | Status: DC
  Administered 2020-05-22: 1 via ORAL
  Filled 2020-05-21: qty 1

## 2020-05-21 MED ORDER — QUETIAPINE FUMARATE 25 MG PO TABS
12.5000 mg | ORAL_TABLET | Freq: Every day | ORAL | Status: DC
  Administered 2020-05-21: 12.5 mg via ORAL
  Filled 2020-05-21: qty 1

## 2020-05-21 NOTE — Progress Notes (Signed)
ARK AGRUSA  JAS:[id number] DOB: 08/06/1926 DOA: 05/18/2020 PCP: Glendale Chard, MD    Brief Narrative:  [phone] with a history of COPD, prostate and bladder cancer, HTN, chronic bilateral lower extremity edema, and diastolic CHF who was sent to the ED from his PCPs office with concern for lower extremity infection.  Patient reported chronic swelling in his legs for over a year.  He was unclear when he first noticed a wound on his right shin.  The date of his admission he also appreciated another wound on his right second toe.  Reportedly he was evaluated by his PCP who was concerned that he had cellulitis and sent him to the ED.  In the ED CT of the lower extremities revealed subcutaneous edema but no evidence of deep tissue infection.  Clinical exam was concerning for cellulitis.  Significant Events:  9/9 admit via [facility] emergency department  Vaccination Status: Vaccinated - last [facility] March 2021  Antimicrobials:  Ceftriaxone 9/9 > Vancomycin 9/9 >  DVT prophylaxis: Lovenox  Subjective: Afebrile.  Vital signs stable.  Saturation 93% on room air.  WBC has normalized.  Assessment & Plan:  Right lower extremity cellulitis Continue empiric antibiotic therapy -continues to improve with narrowed antibiotic spectrum -transition to oral antibiotics tomorrow  UTI - symptomatic Patient reported dysuria on history -should be covered with antibiotic being used for cellulitis -urine culture has not proven to be helpful  Dehydration Corrected with volume resuscitation  Multiple frequent falls ROS revealed multiple frequent falls at home which appear to be mechanical in nature -PT/OT suggest SNF placement most appropriate at this time -I spoke to the patient and he now agrees to SNF placement with the goal being rehabilitation  Ulceration of foot - right No known history of trauma -monitor for now and if wounds fail to heal with proper therapy can consider vascular work-up as an  outpatient, the patient may not be a candidate for interventions if he does have vascular disease  Microcytic anemia of unclear etiology Iron studies suggestive of anemia of chronic disease - hemoglobin holding steady  HTN Blood pressure currently well controlled  Chronic diastolic CHF No gross volume overload at present  Code Status: NO CODE BLUE Family Communication:  Status is: Inpatient  Remains inpatient appropriate because:Inpatient level of care appropriate due to severity of illness   Dispo: The patient is from: Home              Anticipated d/c is to: unclear              Anticipated d/c date is: 2 days              Patient currently is not medically stable to d/c.  Consultants:  none  Objective: Blood pressure 124/87, pulse 73, temperature (!) 97.5 F (36.4 C), temperature source Oral, resp. rate 14, height 6\' 1"  (1.854 m), weight 70.3 kg, SpO2 93 %.  Intake/Output Summary (Last 24 hours) at 05/21/2020 0952 Last data filed at 05/21/2020 7902 Gross per 24 hour  Intake 720 ml  Output 800 ml  Net -80 ml   Filed Weights   05/18/20 1817  Weight: 70.3 kg    Examination: General: No acute respiratory distress Lungs: CTA B without wheezing Cardiovascular: RRR Abdomen:NT/ND, soft, bs+, no mass  Extremities: No edema B LE -change of chronic venous stasis dermatitis appreciable bilateral lower extremities  CBC: Recent Labs  Lab 05/18/20 1850 05/18/20 1850 05/19/20 0300 05/20/20 0615 05/21/20 0730  WBC  6.1   < > 7.2 14.6* 4.9  NEUTROABS 4.7  --  5.2  --   --   HGB 9.2*   < > 8.9* 8.5* 8.5*  HCT 30.7*   < > 28.8* 25.1* 28.0*  MCV 80.2   < > 79.3* 84.2 79.3*  PLT 227   < > 203 144* 211   < > = values in this interval not displayed.   Basic Metabolic Panel: Recent Labs  Lab 05/19/20 0300 05/20/20 0615 05/21/20 0730  NA 138 141 138  K 4.6 3.5 3.8  CL 103 102 99  CO2 28 26 28   GLUCOSE 95 112* 86  BUN 27* 12 25*  CREATININE 0.94 0.87 0.81  CALCIUM  8.6* 9.2 8.8*   GFR: Estimated Creatinine Clearance: 57.9 mL/min (by C-G formula based on SCr of 0.81 mg/dL).  Liver Function Tests: Recent Labs  Lab 05/18/20 1850 05/20/20 0615  AST 20 21  ALT 18 12  ALKPHOS 40 61  BILITOT 0.7 2.2*  PROT 6.0* 7.5  ALBUMIN 3.3* 4.6    HbA1C: Hgb A1c MFr Bld  Date/Time Value Ref Range Status  06/25/2018 03:36 PM 5.9 (H) 4.8 - 5.6 % Final    Comment:             Prediabetes: 5.7 - 6.4          Diabetes: >6.4          Glycemic control for adults with diabetes: <7.0   09/29/2017 01:47 PM 5.9 (H) 4.8 - 5.6 % Final    Comment:    (NOTE) Pre diabetes:          5.7%-6.4% Diabetes:              >6.4% Glycemic control for   <7.0% adults with diabetes      Recent Results (from the past 240 hour(s))  Culture, blood (Routine X 2) w Reflex to ID Panel     Status: None (Preliminary result)   Collection Time: 05/18/20  7:30 PM   Specimen: BLOOD RIGHT FOREARM  Result Value Ref Range Status   Specimen Description   Final    BLOOD RIGHT FOREARM Performed at [facility], [place] [address], [place], [place] [address]    Special Requests   Final    BOTTLES DRAWN AEROBIC AND ANAEROBIC Blood Culture adequate volume Performed at [place] [address], [place], [place] [address]    Culture   Final    NO GROWTH 1 DAY Performed at [facility], [place] [address] [place], [place] [address]    Report Status PENDING  Incomplete  Culture, blood (Routine X 2) w Reflex to ID Panel     Status: None (Preliminary result)   Collection Time: 05/18/20  7:50 PM   Specimen: BLOOD RIGHT HAND  Result Value Ref Range Status   Specimen Description   Final    BLOOD RIGHT HAND Performed at [place] [address], [place], [place] [address]    Special Requests   Final    BOTTLES DRAWN AEROBIC AND ANAEROBIC Blood Culture adequate volume Performed at [place]  [address], [place], [place] [address]    Culture   Final    NO GROWTH 1 DAY Performed at [facility], [place] [address], [place],  [address]    Report Status PENDING  Incomplete  Culture, Urine     Status: Abnormal   Collection  Time: 05/18/20 10:38 PM   Specimen: Urine, Clean Catch  Result Value Ref Range Status   Specimen Description   Final    URINE, CLEAN CATCH Performed at [facility], [place] [address], [place], [place] [address]    Special Requests   Final    NONE Performed at [facility], [place] [address], [place], [place] [address]    Culture MULTIPLE SPECIES PRESENT, SUGGEST RECOLLECTION (A)  Final   Report Status 05/19/2020 FINAL  Final  SARS Coronavirus 2 by RT PCR (hospital order, performed in [facility] hospital lab) Nasopharyngeal Nasopharyngeal Swab     Status: None   Collection Time: 05/18/20 10:46 PM   Specimen: Nasopharyngeal Swab  Result Value Ref Range Status   SARS Coronavirus 2 NEGATIVE NEGATIVE Final    Comment: (NOTE) SARS-CoV-2 target nucleic acids are NOT DETECTED.  The SARS-CoV-2 RNA is generally detectable in upper and lower respiratory specimens during the acute phase of infection. The lowest concentration of SARS-CoV-2 viral copies this assay can detect is 250 copies / mL. A negative result does not preclude SARS-CoV-2 infection and should not be used as the sole basis for treatment or other patient management decisions.  A negative result may occur with improper specimen collection / handling, submission of specimen other than nasopharyngeal swab, presence of viral mutation(s) within the areas targeted by this assay, and inadequate number of viral copies (<250 copies / mL). A negative result must be combined with clinical observations, patient history, and epidemiological information.  Fact Sheet for Patients:   StrictlyIdeas.no  Fact Sheet for Healthcare  Providers: BankingDealers.co.za  This test is not yet approved or  cleared by the [place] FDA and has been authorized for detection and/or diagnosis of SARS-CoV-2 by FDA under an Emergency Use Authorization (EUA).  This EUA will remain in effect (meaning this test can be used) for the duration of the COVID-19 declaration under Section 564(b)(1) of the Act, 21 U.S.C. section 360bbb-3(b)(1), unless the authorization is terminated or revoked sooner.  Performed at [facility], [place] [address] [place], [place] [address]      Scheduled Meds: . amLODipine  5 mg Oral Daily   And  . olmesartan  20 mg Oral Daily  . aspirin EC  81 mg Oral Daily  . enoxaparin (LOVENOX) injection  40 mg Subcutaneous Q24H  . linaclotide  145 mcg Oral QAC breakfast  . metoprolol tartrate  25 mg Oral BID  . potassium chloride SA  20 mEq Oral Daily  . sodium chloride flush  3 mL Intravenous Q12H   Continuous Infusions: . cefTRIAXone (ROCEPHIN)  IV Stopped (05/20/20 1855)     LOS: 3 days   Cherene Altes, MD [facility] Office  [phone] Pager - Text Page per Amion  If 7PM-7AM, please contact night-coverage per Amion 05/21/2020, 9:52 AM

## 2020-05-21 NOTE — Progress Notes (Signed)
Occupational Therapy Treatment Patient Details Name: Geoffrey Frank MRN: [id number] DOB: [birth year] Today's Date: 05/21/2020    History of present illness 86 y/o M sent to the ED from his PCPs office with concern for lower extremity infection.  Patient reported chronic swelling in his legs for over a year. In the ED CT of the lower extremities revealed subcutaneous edema but no evidence of deep tissue infection.  Clinical exam was in fact concerning for significant cellulitis.   OT comments  Patient reported dizziness after transfer into sitting and BP 103/79. Patient agreeable to transfer to the chair but no further therapy. With standing patient's balance significantly impaired due to posterior lean. Patient left walker behind to transfer to recliner with use of chair arms. Patient unsafe to return home due to high fall risk. Continue to recommend short term rehab at discharge and patient verbalized understanding.   Follow Up Recommendations  SNF;Supervision/Assistance - 24 hour    Equipment Recommendations  3 in 1 bedside commode    Recommendations for Other Services      Precautions / Restrictions Precautions Precautions: Fall Restrictions Weight Bearing Restrictions: No       Mobility Bed Mobility Overal bed mobility: Needs Assistance Bed Mobility: Supine to Sit     Supine to sit: Min guard     General bed mobility comments: increased time to transfer to side of the bed.  Transfers Overall transfer level: Needs assistance Equipment used: Rolling walker (2 wheeled) Transfers: Sit to/from Stand Sit to Stand: Min assist;From elevated surface         General transfer comment: Min assist to stand from bed. Patient exhibited significant posterior lean and feet extended forward resulting in return to seated position. Stood again with increase bed height and verba cues for foot placement. Once again patient leaning backwards. Patient leaned over and reaching for chair  arms and not the walker. Patient min assist for transfer but unsafe.    Balance Overall balance assessment: Needs assistance Sitting-balance support: No upper extremity supported;Feet supported Sitting balance-Leahy Scale: Fair   Postural control: Posterior lean Standing balance support: Bilateral upper extremity supported Standing balance-Leahy Scale: Poor                             ADL either performed or assessed with clinical judgement   ADL                                               Vision   Vision Assessment?: No apparent visual deficits   Perception     Praxis      Cognition Arousal/Alertness: Awake/alert Behavior During Therapy: WFL for tasks assessed/performed Overall Cognitive Status: Within Functional Limits for tasks assessed                                          Exercises     Shoulder Instructions       General Comments      Pertinent Vitals/ Pain       Pain Assessment: No/denies pain  Home Living  Prior Functioning/Environment              Frequency  Min 2X/week        Progress Toward Goals  OT Goals(current goals can now be found in the care plan section)  Progress towards OT goals: Progressing toward goals  Acute Rehab OT Goals Patient Stated Goal: Stop falling OT Goal Formulation: With patient Time For Goal Achievement: 06/02/20 Potential to Achieve Goals: Good  Plan Discharge plan remains appropriate    Co-evaluation                 AM-PAC OT "6 Clicks" Daily Activity     Outcome Measure   Help from another person eating meals?: None Help from another person taking care of personal grooming?: A Little Help from another person toileting, which includes using toliet, bedpan, or urinal?: Total Help from another person bathing (including washing, rinsing, drying)?: Total Help from another person to put  on and taking off regular upper body clothing?: A Little Help from another person to put on and taking off regular lower body clothing?: Total 6 Click Score: 13    End of Session Equipment Utilized During Treatment: Gait belt;Rolling walker  OT Visit Diagnosis: Unsteadiness on feet (R26.81);Repeated falls (R29.6);History of falling (Z91.81)   Activity Tolerance Patient tolerated treatment well   Patient Left in chair;with chair alarm set   Nurse Communication Mobility status        Time: 1030-1048 OT Time Calculation (min): 18 min  Charges: OT General Charges $OT Visit: 1 Visit OT Treatments $Therapeutic Activity: 8-22 mins  Claira Jeter, OTR/L [place]  Office [phone] Pager: [place] 05/21/2020, 1:37 PM

## 2020-05-22 ENCOUNTER — Ambulatory Visit: Payer: Medicare PPO

## 2020-05-22 DIAGNOSIS — R2681 Unsteadiness on feet: Secondary | ICD-10-CM | POA: Diagnosis not present

## 2020-05-22 DIAGNOSIS — C677 Malignant neoplasm of urachus: Secondary | ICD-10-CM | POA: Diagnosis not present

## 2020-05-22 DIAGNOSIS — R600 Localized edema: Secondary | ICD-10-CM | POA: Diagnosis not present

## 2020-05-22 DIAGNOSIS — R635 Abnormal weight gain: Secondary | ICD-10-CM | POA: Diagnosis not present

## 2020-05-22 DIAGNOSIS — B9689 Other specified bacterial agents as the cause of diseases classified elsewhere: Secondary | ICD-10-CM | POA: Diagnosis not present

## 2020-05-22 DIAGNOSIS — I10 Essential (primary) hypertension: Secondary | ICD-10-CM | POA: Diagnosis not present

## 2020-05-22 DIAGNOSIS — N182 Chronic kidney disease, stage 2 (mild): Secondary | ICD-10-CM

## 2020-05-22 DIAGNOSIS — M6281 Muscle weakness (generalized): Secondary | ICD-10-CM | POA: Diagnosis not present

## 2020-05-22 DIAGNOSIS — I739 Peripheral vascular disease, unspecified: Secondary | ICD-10-CM | POA: Diagnosis not present

## 2020-05-22 DIAGNOSIS — L03115 Cellulitis of right lower limb: Secondary | ICD-10-CM | POA: Diagnosis not present

## 2020-05-22 DIAGNOSIS — N390 Urinary tract infection, site not specified: Secondary | ICD-10-CM | POA: Diagnosis not present

## 2020-05-22 DIAGNOSIS — R4182 Altered mental status, unspecified: Secondary | ICD-10-CM | POA: Diagnosis not present

## 2020-05-22 DIAGNOSIS — Z66 Do not resuscitate: Secondary | ICD-10-CM | POA: Diagnosis not present

## 2020-05-22 DIAGNOSIS — I5032 Chronic diastolic (congestive) heart failure: Secondary | ICD-10-CM

## 2020-05-22 DIAGNOSIS — L539 Erythematous condition, unspecified: Secondary | ICD-10-CM | POA: Diagnosis not present

## 2020-05-22 DIAGNOSIS — N3000 Acute cystitis without hematuria: Secondary | ICD-10-CM | POA: Diagnosis not present

## 2020-05-22 DIAGNOSIS — Z7401 Bed confinement status: Secondary | ICD-10-CM | POA: Diagnosis not present

## 2020-05-22 DIAGNOSIS — R410 Disorientation, unspecified: Secondary | ICD-10-CM | POA: Diagnosis not present

## 2020-05-22 DIAGNOSIS — J449 Chronic obstructive pulmonary disease, unspecified: Secondary | ICD-10-CM | POA: Diagnosis not present

## 2020-05-22 DIAGNOSIS — C679 Malignant neoplasm of bladder, unspecified: Secondary | ICD-10-CM | POA: Diagnosis not present

## 2020-05-22 DIAGNOSIS — L97811 Non-pressure chronic ulcer of other part of right lower leg limited to breakdown of skin: Secondary | ICD-10-CM | POA: Diagnosis not present

## 2020-05-22 DIAGNOSIS — L0390 Cellulitis, unspecified: Secondary | ICD-10-CM | POA: Diagnosis not present

## 2020-05-22 DIAGNOSIS — I872 Venous insufficiency (chronic) (peripheral): Secondary | ICD-10-CM | POA: Diagnosis not present

## 2020-05-22 DIAGNOSIS — Z9181 History of falling: Secondary | ICD-10-CM | POA: Diagnosis not present

## 2020-05-22 DIAGNOSIS — M2550 Pain in unspecified joint: Secondary | ICD-10-CM | POA: Diagnosis not present

## 2020-05-22 DIAGNOSIS — L97519 Non-pressure chronic ulcer of other part of right foot with unspecified severity: Secondary | ICD-10-CM | POA: Diagnosis not present

## 2020-05-22 DIAGNOSIS — R41841 Cognitive communication deficit: Secondary | ICD-10-CM | POA: Diagnosis not present

## 2020-05-22 LAB — SARS CORONAVIRUS 2 BY RT PCR (HOSPITAL ORDER, PERFORMED IN ~~LOC~~ HOSPITAL LAB): SARS Coronavirus 2: NEGATIVE

## 2020-05-22 MED ORDER — QUETIAPINE FUMARATE 25 MG PO TABS: 12.5000 mg | ORAL_TABLET | Freq: Every day | ORAL | Status: DC

## 2020-05-22 MED ORDER — ACETAMINOPHEN 325 MG PO TABS: 650.0000 mg | ORAL_TABLET | Freq: Four times a day (QID) | ORAL | Status: DC | PRN

## 2020-05-22 MED ORDER — AMOXICILLIN-POT CLAVULANATE 875-125 MG PO TABS: 1.0000 | ORAL_TABLET | Freq: Two times a day (BID) | ORAL | Status: AC

## 2020-05-22 NOTE — TOC Transition Note (Signed)
Transition of Care [facility]) - CM/SW Discharge Note   Patient Details  Name: Geoffrey Frank MRN: [id number] Date of Birth: 12-Feb-1926  Transition of Care [facility]) CM/SW Contact:  Lynnell Catalan, RN Phone Number: 05/22/2020, 3:10 PM   Clinical Narrative:    This CM spoke with pt at bedside for dc planning. Pt states that he now plans to go to SNF. FL2 faxed out and bed offers given to pt and son Nehan Flaum. This CM submitted for Passr and Passr was received [phone]. Insurance auth started with [employer]. [facility] chosen for SNF. [employer] has waived SNF auth at this time so the SNF will follow up for auth. Pt will transport to SNF via ambulance and yellow DNR is on chart for transport. RN to call report to [phone].   Final next level of care: Skilled Nursing Facility Barriers to Discharge: No Barriers Identified   Patient Goals and CMS Choice Patient states their goals for this hospitalization and ongoing recovery are:: to get back home CMS Medicare.gov Compare Post Acute Care list provided to:: Patient Represenative (must comment) (Son) Choice offered to / list presented to : Patient, Adult Children  Discharge Placement              Patient chooses bed at: [facility] Patient to be transferred to facility by: [place] Name of family member notified: Son Corley Maffeo Patient and family notified of of transfer: 05/22/20  Discharge Plan and Services   Discharge Planning Services: CM Consult Post Acute Care Choice: [facility]                               Social Determinants of Health (SDOH) Interventions     Readmission Risk Interventions No flowsheet data found.

## 2020-05-22 NOTE — Discharge Summary (Signed)
DISCHARGE SUMMARY  SOMA LIZAK  MR#: [id number]  DOB:08/27/1926  Date of Admission: 05/18/2020 Date of Discharge: 05/22/2020  Attending Physician:Ameen Mostafa Hennie Duos, MD  Patient's GDJ:MEQASTM, Bailey Mech, MD  Consults: none  Disposition: D/C to SNF   Follow-up Appts:  Follow-up Information    Glendale Chard, MD Follow up in 7 day(s).   Specialty: Internal Medicine Contact information: [address] [place] [address] [phone]        Pixie Casino, MD .   Specialty: Cardiology Contact information: [place] [address] [phone]               Tests Needing Follow-up: -close monitioring of the R LE shin and foot wounds will be required -outpatient ABIs of both LE should be considered to assess for subacute B LE vascular disease  Discharge Diagnoses: Right lower extremity cellulitis UTI - symptomatic Dehydration Multiple frequent falls Ulceration of foot - right Microcytic anemia of unclear etiology HTN Chronic diastolic CHF NO CODE BLUE - DNR   Initial presentation: 86yo with a history of COPD, prostate and bladder cancer, HTN, chronic bilateral lower extremity edema, and diastolic CHF who was sent to the ED from his PCPs office with concern for lower extremity infection.  Patient reported chronic swelling in his legs for over a year.  He was unclear when he first noticed a wound on his right shin.  The date of his admission he also appreciated another wound on his right second toe.  Reportedly he was evaluated by his PCP who was concerned that he had cellulitis and sent him to the ED.  In the ED CT of the lower extremities revealed subcutaneous edema but no evidence of deep tissue infection.  Clinical exam was concerning for cellulitis.  Hospital Course:  Right lower extremity cellulitis Continue empiric antibiotic therapy -continues to improve with narrowed antibiotic spectrum -transitioned to oral  antibiotics at time of d/c - at time of admission there was significant concern for limb threatening infection of the R shin and R foot - IV abx were clearly indicated initially given the severity of the infection - with volume expansion, and IV abx, as well as local wound care, the patient's cellulitis improved much more rapidly than was expected - as such, it is felt that an outpt vascular evaluation is sufficient in order to allow the pt to be d/c to a SNF in shorter course as f/u exam after clinical improvement suggests no further concern for acute vascular compromise   UTI - symptomatic Patient reported dysuria on history -should be covered with antibiotic being used for cellulitis -urine culture has not proven to be helpful - clinically much improved   Toxic metabolic encephalopathy Combination of acute illness (cellulitis and UTI), dehydration, and sundowning - limited patients ability to participate in care initially - has improved w/ IVF and IV abx tx   Dehydration Corrected with volume resuscitation utilizing IVF   Multiple frequent falls ROS revealed multiple frequent falls at home which appear to be mechanical in nature -PT/OT suggest SNF placement most appropriate at this time -I spoke to the patient and he agrees to SNF placement with the goal being rehabilitation  Ulceration of foot - right lateral and right great toe  No known history of trauma - stable w/ care provided in hosptial - consider vascular work-up as an outpatient  Microcytic anemia of unclear etiology Iron studies suggestive of anemia of chronic disease - hemoglobin holding steady -  improved nutrition at rehab facility should assist in recovery   HTN Blood pressure currently well controlled  Chronic diastolic CHF No gross volume overload at present  Statement on need for hospital admission: Admission was indicated for 1 or more of the following: Limb-threatening infection - at presentation there was  signif concern for gangrene of the R foot/toe Clinical presentation (acuity of infection, rapidity of progression) - the pt was unsure of the timing and his R LE wounds appeared to have very acutely developed w/ severe surrounding erythema worrisome for vascular etiology or limb threatening infection w/ vascular compromise Inability to maintain oral hydration - patients oral intake was limited initially due to his overall poor feeling in the setting of his acute LE cellulitis - it was not felt he could safely or consistently be tx w/ oral abx given this fact (as well as the fact the severity of the cellulitis at presentation warranted IV tx)   Allergies as of 05/22/2020   No Known Allergies     Medication List    STOP taking these medications   furosemide 40 MG tablet Commonly known as: LASIX   Potassium Chloride ER 20 MEQ Tbcr     TAKE these medications   acetaminophen 325 MG tablet Commonly known as: TYLENOL Take 2 tablets (650 mg total) by mouth every 6 (six) hours as needed for mild pain (or Fever >/= 101). What changed:   medication strength  how much to take  when to take this  reasons to take this   amLODipine-olmesartan 10-40 MG tablet Commonly known as: AZOR Take 0.5 tablets by mouth daily.   amoxicillin-clavulanate 875-125 MG tablet Commonly known as: AUGMENTIN Take 1 tablet by mouth every 12 (twelve) hours for 5 days.   aspirin 81 MG tablet Take 81 mg by mouth daily.   docusate sodium 100 MG capsule Commonly known as: COLACE Take 100 mg by mouth daily as needed for mild constipation.   lidocaine 5 % ointment Commonly known as: XYLOCAINE Apply 1 application topically as needed. What changed: reasons to take this   Linzess 145 MCG Caps capsule Generic drug: linaclotide Take 145 mcg by mouth daily before breakfast.   metoprolol tartrate 25 MG tablet Commonly known as: LOPRESSOR Take 1 tablet (25 mg total) by mouth 2 (two) times daily.   nystatin  cream Commonly known as: MYCOSTATIN Apply 1 application topically 2 (two) times daily. Apply to affected area twice a day   polyethylene glycol powder 17 GM/SCOOP powder Commonly known as: MiraLax Take 17 g by mouth daily as needed for moderate constipation or severe constipation.   QUEtiapine 25 MG tablet Commonly known as: SEROQUEL Take 0.5 tablets (12.5 mg total) by mouth at bedtime.       Day of Discharge BP 131/86   Pulse 81   Temp 98 F (36.7 C)   Resp 16   Ht 6\' 1"  (1.854 m)   Wt 70.3 kg   SpO2 100%   BMI 20.45 kg/m   Physical Exam: General: No acute respiratory distress Lungs: Clear to auscultation bilaterally without wheezes or crackles Cardiovascular: Regular rate and rhythm without murmur gallop or rub normal S1 and S2 Abdomen: Nontender, nondistended, soft, bowel sounds positive, no rebound, no ascites, no appreciable mass Extremities: No significant cyanosis, clubbing, or edema bilateral lower extremities - superficial well circumscribed cutaneous injury of R shin ~4cm x 2cm appearing punched out but not deep w/ now resolution of prior severe surrounding erythema - dried callous to  tip of R great toe ~41mm - R lateral dorasl foot wound ~quarter size which is punched out in appearance, with prior surrounding erythema much improved   Basic Metabolic Panel: Recent Labs  Lab 05/18/20 1850 05/19/20 0300 05/20/20 0615 05/21/20 0730  NA 142 138 141 138  K 4.1 4.6 3.5 3.8  CL 105 103 102 99  CO2 27 28 26 28   GLUCOSE 123* 95 112* 86  BUN 30* 27* 12 25*  CREATININE 1.02 0.94 0.87 0.81  CALCIUM 9.2 8.6* 9.2 8.8*    Liver Function Tests: Recent Labs  Lab 05/18/20 1850 05/20/20 0615  AST 20 21  ALT 18 12  ALKPHOS 40 61  BILITOT 0.7 2.2*  PROT 6.0* 7.5  ALBUMIN 3.3* 4.6    CBC: Recent Labs  Lab 05/18/20 1850 05/19/20 0300 05/20/20 0615 05/21/20 0730  WBC 6.1 7.2 14.6* 4.9  NEUTROABS 4.7 5.2  --   --   HGB 9.2* 8.9* 8.5* 8.5*  HCT 30.7* 28.8*  25.1* 28.0*  MCV 80.2 79.3* 84.2 79.3*  PLT 227 203 144* 211    BNP (last 3 results) Recent Labs    06/09/19 1729  BNP 187.3*     Recent Results (from the past 240 hour(s))  Culture, blood (Routine X 2) w Reflex to ID Panel     Status: None (Preliminary result)   Collection Time: 05/18/20  7:30 PM   Specimen: BLOOD RIGHT FOREARM  Result Value Ref Range Status   Specimen Description   Final    BLOOD RIGHT FOREARM Performed at [facility], [place] [address], [place], [place] [address]    Special Requests   Final    BOTTLES DRAWN AEROBIC AND ANAEROBIC Blood Culture adequate volume Performed at [facility] [address] [place], [place] [address]    Culture   Final    NO GROWTH 2 DAYS Performed at [facility] [address] [place], [place] [address]    Report Status PENDING  Incomplete  Culture, blood (Routine X 2) w Reflex to ID Panel     Status: None (Preliminary result)   Collection Time: 05/18/20  7:50 PM   Specimen: BLOOD RIGHT HAND  Result Value Ref Range Status   Specimen Description   Final    BLOOD RIGHT HAND Performed at [facility] [address] [place], [place] [address]    Special Requests   Final    BOTTLES DRAWN AEROBIC AND ANAEROBIC Blood Culture adequate volume Performed at [facility] [address], [place], [place] [address]    Culture   Final    NO GROWTH 2 DAYS Performed at [facility] [address], [place], [place] [address]    Report Status PENDING  Incomplete  Culture, Urine     Status: Abnormal   Collection Time: 05/18/20 10:38 PM   Specimen: Urine, Clean Catch  Result Value Ref Range Status   Specimen Description   Final    URINE, CLEAN CATCH Performed at [facility], [place] [address], [place], [place] [address]    Special Requests   Final    NONE Performed at [facility], [place] [address]  [place], [place] [address]    Culture MULTIPLE SPECIES PRESENT, SUGGEST RECOLLECTION (A)  Final   Report Status 05/19/2020 FINAL  Final  SARS Coronavirus 2 by RT PCR (hospital order, performed in [facility] hospital lab) Nasopharyngeal Nasopharyngeal Swab  Status: None   Collection Time: 05/18/20 10:46 PM   Specimen: Nasopharyngeal Swab  Result Value Ref Range Status   SARS Coronavirus 2 NEGATIVE NEGATIVE Final    Comment: (NOTE) SARS-CoV-2 target nucleic acids are NOT DETECTED.  The SARS-CoV-2 RNA is generally detectable in upper and lower respiratory specimens during the acute phase of infection. The lowest concentration of SARS-CoV-2 viral copies this assay can detect is 250 copies / mL. A negative result does not preclude SARS-CoV-2 infection and should not be used as the sole basis for treatment or other patient management decisions.  A negative result may occur with improper specimen collection / handling, submission of specimen other than nasopharyngeal swab, presence of viral mutation(s) within the areas targeted by this assay, and inadequate number of viral copies (<250 copies / mL). A negative result must be combined with clinical observations, patient history, and epidemiological information.  Fact Sheet for Patients:   StrictlyIdeas.no  Fact Sheet for Healthcare Providers: BankingDealers.co.za  This test is not yet approved or  cleared by the [place] FDA and has been authorized for detection and/or diagnosis of SARS-CoV-2 by FDA under an Emergency Use Authorization (EUA).  This EUA will remain in effect (meaning this test can be used) for the duration of the COVID-19 declaration under Section 564(b)(1) of the Act, 21 U.S.C. section 360bbb-3(b)(1), unless the authorization is terminated or revoked sooner.  Performed at [facility], [place] [address], [place], [place] [address]      Time spent in  discharge (includes decision making & examination of pt): 35 minutes  05/22/2020, 2:31 PM   Cherene Altes, MD [facility] Office  [phone]

## 2020-05-22 NOTE — Discharge Instructions (Signed)

## 2020-05-22 NOTE — Progress Notes (Signed)
Report called to [employer], spoke to [employer]. Patient transported via ambulance.

## 2020-05-22 NOTE — Chronic Care Management (AMB) (Signed)
  Care Management    Consult Note  05/22/2020 Name: TOSH GLAZE MRN: [id number] DOB: 1926/01/07  Care management team received notification of patient's recent inpatient hospitalization related to bilateral lower extremity edema and cellulitis .Based on review of health record, RAYMIE GIAMMARCO is currently active in the embedded care coordination program.   Review of patient status, including review of consultants reports, relevant laboratory and other test results, and collaboration with appropriate care team members and the patient's provider was performed as part of comprehensive patient evaluation and provision of chronic care management services.    Plan: Collaboration with hospital liaison to inform of patient dispostition and engagement with embedded care coordination team.   [name], BSW, CDP Social Worker, Certified Dementia Practitioner [place] [facility] [phone]

## 2020-05-22 NOTE — Care Management Important Message (Signed)
Important Message  Patient Details IM Letter given to the Patient Name: Geoffrey Frank MRN: [id number] Date of Birth: 10-Dec-1925   Medicare Important Message Given:  Yes     Kerin Salen 05/22/2020, 10:11 AM

## 2020-05-22 NOTE — Progress Notes (Signed)
°  Transition of Care (TOC) -30 day Note       Patient Details  Name: Geoffrey Frank MRN: [id number] Date of Birth: 1926/05/22    MUST ID: [id number]   To Whom it May Concern:   Please be advised that the above patient will require a short-term nursing home stay, anticipated 30 days or less rehabilitation and strengthening. The plan is for return home.

## 2020-05-22 NOTE — NC FL2 (Signed)
[place] LEVEL OF CARE SCREENING TOOL     IDENTIFICATION  Patient Name: Geoffrey Frank Birthdate: 1926/04/12 Sex: male Admission Date (Current Location): 05/18/2020  [facility] and [place] Number:  Herbalist and Address:  [facility],  [place] [address] [place]      Provider Number: [id number]  Attending Physician Name and Address:  Cherene Altes, MD  Relative Name and Phone Number:       Current Level of Care: Hospital Recommended Level of Care: [place] Prior Approval Number:    Date Approved/Denied:   PASRR Number:    Discharge Plan: SNF    Current Diagnoses: Patient Active Problem List   Diagnosis Date Noted  . Benign hypertensive renal disease 05/18/2020  . Chronic renal disease, stage II 05/18/2020  . Chronic diastolic heart failure ([place]) 05/18/2020  . Cellulitis 05/18/2020  . Bilateral lower extremity edema 03/13/2017  . SVT (supraventricular tachycardia) ([place]) 03/25/2016  . Epistaxis 03/08/2015  . Essential hypertension 02/01/2014  . Chest pain 02/01/2014  . Bladder cancer ([place]) 10/14/2011  . CHRONIC RHINITIS 09/17/2007  . COPD (chronic obstructive pulmonary disease) ([place]) 09/17/2007  . DEGENERATIVE DISC DISEASE 09/17/2007  . PROSTATE CANCER, HX OF 09/17/2007    Orientation RESPIRATION BLADDER Height & Weight     Self, Time, Situation, Place  Normal Incontinent Weight: 70.3 kg Height:  6\' 1"  (185.4 cm)  BEHAVIORAL SYMPTOMS/MOOD NEUROLOGICAL BOWEL NUTRITION STATUS      Incontinent Diet (Regular)  AMBULATORY STATUS COMMUNICATION OF NEEDS Skin   Limited Assist Verbally Skin abrasions, Other (Comment) (L arm abrasion, L and R leg cellulitis)                       Personal Care Assistance Level of Assistance  Dressing, Bathing Bathing Assistance: Limited assistance   Dressing Assistance: Limited assistance     Functional Limitations Info  Sight, Hearing Sight Info:  Adequate Hearing Info: Adequate      SPECIAL CARE FACTORS FREQUENCY  PT (By licensed PT), OT (By licensed OT)     PT Frequency: 5 x weekly OT Frequency: 5 x weekly            Contractures Contractures Info: Not present    Additional Factors Info  Code Status, Allergies Code Status Info: DRN Allergies Info: No known drug allergies           Current Medications (05/22/2020):  This is the current hospital active medication list Current Facility-Administered Medications  Medication Dose Route Frequency Provider Last Rate Last Admin  . acetaminophen (TYLENOL) tablet 650 mg  650 mg Oral Q6H PRN Clarnce Flock, MD      . amLODipine (NORVASC) tablet 5 mg  5 mg Oral Daily Clarnce Flock, MD   5 mg at 05/20/20 1059   And  . olmesartan (BENICAR) tablet 20 mg  20 mg Oral Daily Clarnce Flock, MD   20 mg at 05/20/20 1059  . amoxicillin-clavulanate (AUGMENTIN) 875-125 MG per tablet 1 tablet  1 tablet Oral Q12H Cherene Altes, MD      . aspirin EC tablet 81 mg  81 mg Oral Daily Clarnce Flock, MD   81 mg at 05/21/20 1014  . enoxaparin (LOVENOX) injection 40 mg  40 mg Subcutaneous Q24H Clarnce Flock, MD   40 mg at 05/21/20 1015  . linaclotide (LINZESS) capsule 145 mcg  145 mcg Oral QAC breakfast Clarnce Flock, MD  145 mcg at 05/21/20 1012  . metoprolol tartrate (LOPRESSOR) tablet 25 mg  25 mg Oral BID Clarnce Flock, MD   25 mg at 05/21/20 2227  . ondansetron (ZOFRAN) tablet 4 mg  4 mg Oral Q6H PRN Clarnce Flock, MD       Or  . ondansetron [facility]) injection 4 mg  4 mg Intravenous Q6H PRN Clarnce Flock, MD      . oxyCODONE (Oxy IR/ROXICODONE) immediate release tablet 5 mg  5 mg Oral Q4H PRN Clarnce Flock, MD      . polyethylene glycol (MIRALAX / GLYCOLAX) packet 17 g  17 g Oral Daily PRN Clarnce Flock, MD      . potassium chloride SA (KLOR-CON) CR tablet 20 mEq  20 mEq Oral Daily Clarnce Flock, MD   20 mEq at 05/21/20 1013  .  QUEtiapine (SEROQUEL) tablet 12.5 mg  12.5 mg Oral QHS Cherene Altes, MD   12.5 mg at 05/21/20 2156  . sodium chloride flush (NS) 0.9 % injection 3 mL  3 mL Intravenous Q12H Clarnce Flock, MD   3 mL at 05/21/20 1013  . traZODone (DESYREL) tablet 50 mg  50 mg Oral QHS PRN Clarnce Flock, MD   50 mg at 05/19/20 0007     Discharge Medications: Please see discharge summary for a list of discharge medications.  Relevant Imaging Results:  Relevant Lab Results:   Additional Information [id number]  Calen Posch, Marjie Skiff, RN

## 2020-05-23 ENCOUNTER — Ambulatory Visit: Payer: Medicare PPO

## 2020-05-23 ENCOUNTER — Telehealth: Payer: Self-pay

## 2020-05-23 DIAGNOSIS — R600 Localized edema: Secondary | ICD-10-CM

## 2020-05-23 DIAGNOSIS — I5032 Chronic diastolic (congestive) heart failure: Secondary | ICD-10-CM

## 2020-05-23 DIAGNOSIS — L03115 Cellulitis of right lower limb: Secondary | ICD-10-CM

## 2020-05-23 DIAGNOSIS — J449 Chronic obstructive pulmonary disease, unspecified: Secondary | ICD-10-CM

## 2020-05-23 NOTE — Chronic Care Management (AMB) (Signed)
°  Care Management    Consult Note  05/23/2020 Name: Geoffrey Frank MRN: [id number] DOB: [birth year]  Care management team received notification of patient's recent transfer to SNF following recent inpatient stay.Based on review of health record, Geoffrey Frank is currently active in the embedded care coordination program.   Review of patient status, including review of consultants reports, relevant laboratory and other test results, and collaboration with appropriate care team members and the patient's provider was performed as part of comprehensive patient evaluation and provision of chronic care management services.    SW collaboration with hospital liaison who confirms patient transfered to [facility] for rehab on 05/22/20.  Plan: Embedded care coordination team will continue to follow patient progress and assist with care coordination needs post discharge.   [name], BSW, CDP Social Worker, Certified Dementia Practitioner [place] [facility] [place] [facility] [phone]

## 2020-05-23 NOTE — Telephone Encounter (Signed)
I called the pt's niece Ms. Bell to let her know that Dr. Baird Cancer wants to know when the pt is getting discharged from skilled care facility so that he can have a hospital f/u and Dr. Baird Cancer wanted to know how the pt is doing.

## 2020-05-24 LAB — CULTURE, BLOOD (ROUTINE X 2)
Culture: NO GROWTH
Culture: NO GROWTH
Special Requests: ADEQUATE
Special Requests: ADEQUATE

## 2020-05-25 ENCOUNTER — Telehealth: Payer: Self-pay

## 2020-05-26 ENCOUNTER — Non-Acute Institutional Stay (SKILLED_NURSING_FACILITY): Payer: Medicare PPO | Admitting: Internal Medicine

## 2020-05-26 ENCOUNTER — Encounter: Payer: Self-pay | Admitting: Internal Medicine

## 2020-05-26 DIAGNOSIS — I872 Venous insufficiency (chronic) (peripheral): Secondary | ICD-10-CM | POA: Diagnosis not present

## 2020-05-26 DIAGNOSIS — Z66 Do not resuscitate: Secondary | ICD-10-CM | POA: Diagnosis not present

## 2020-05-26 DIAGNOSIS — L03115 Cellulitis of right lower limb: Secondary | ICD-10-CM | POA: Diagnosis not present

## 2020-05-26 DIAGNOSIS — N3000 Acute cystitis without hematuria: Secondary | ICD-10-CM | POA: Diagnosis not present

## 2020-05-26 DIAGNOSIS — I739 Peripheral vascular disease, unspecified: Secondary | ICD-10-CM

## 2020-05-26 DIAGNOSIS — C679 Malignant neoplasm of bladder, unspecified: Secondary | ICD-10-CM

## 2020-05-26 DIAGNOSIS — Z9181 History of falling: Secondary | ICD-10-CM | POA: Diagnosis not present

## 2020-05-26 DIAGNOSIS — R410 Disorientation, unspecified: Secondary | ICD-10-CM
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
## 2020-05-29 ENCOUNTER — Non-Acute Institutional Stay (SKILLED_NURSING_FACILITY): Payer: Medicare PPO | Admitting: Family

## 2020-05-29 ENCOUNTER — Encounter: Payer: Self-pay | Admitting: Family

## 2020-05-29 DIAGNOSIS — R635 Abnormal weight gain: Secondary | ICD-10-CM

## 2020-05-29 DIAGNOSIS — I5032 Chronic diastolic (congestive) heart failure: Secondary | ICD-10-CM

## 2020-05-29 LAB — CBC AND DIFFERENTIAL
HCT: 29 — AB (ref 41–53)
Hemoglobin: 9.2 — AB (ref 13.5–17.5)
Platelets: 193 (ref 150–399)
WBC: 6.5

## 2020-05-29 LAB — BASIC METABOLIC PANEL
BUN: 23 — AB (ref 4–21)
CO2: 22 (ref 13–22)
Chloride: 101 (ref 99–108)
Creatinine: 0.9 (ref 0.6–1.3)
Glucose: 76
Potassium: 4.6 (ref 3.4–5.3)
Sodium: 134 — AB (ref 137–147)

## 2020-05-29 LAB — COMPREHENSIVE METABOLIC PANEL
Calcium: 9.2 (ref 8.7–10.7)
GFR calc Af Amer: 81.04
GFR calc non Af Amer: 69.92

## 2020-05-29 LAB — CBC: RBC: 3.75 — AB (ref 3.87–5.11)
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
## 2020-06-01 ENCOUNTER — Telehealth: Payer: Medicare PPO

## 2020-06-06 ENCOUNTER — Encounter: Payer: Self-pay | Admitting: Internal Medicine

## 2020-06-07 ENCOUNTER — Ambulatory Visit: Payer: Medicare PPO

## 2020-06-07 DIAGNOSIS — J449 Chronic obstructive pulmonary disease, unspecified: Secondary | ICD-10-CM

## 2020-06-07 DIAGNOSIS — L03115 Cellulitis of right lower limb: Secondary | ICD-10-CM

## 2020-06-07 DIAGNOSIS — I5032 Chronic diastolic (congestive) heart failure: Secondary | ICD-10-CM

## 2020-06-07 NOTE — Chronic Care Management (AMB) (Signed)
  Chronic Care Management    Social Work Follow Up Note  06/07/2020 Name: Geoffrey Frank MRN: [id number] DOB: October 16, 1925  Geoffrey Frank is a 86 y.o. year old male who is a primary care patient of Glendale Chard, MD. The CCM team was consulted for assistance with care coordination.   Review of patient status, including review of consultants reports, other relevant assessments, and collaboration with appropriate care team members and the patient's provider was performed as part of comprehensive patient evaluation and provision of chronic care management services.    SW placed a successful outbound call to [facility] to follow up on patient progress. SW spoke with Selena in the SW department. Informed patient is doing well and actively participating with PT and OT. At this time patient insurance has not issued any information regarding patient discharge planning. Selena reports patient has expressed desire to return home once he has completed rehab.    Outpatient Encounter Medications as of 06/07/2020  Medication Sig  . acetaminophen (TYLENOL) 325 MG tablet Take 2 tablets (650 mg total) by mouth every 6 (six) hours as needed for mild pain (or Fever >/= 101).  [name] amLODipine-olmesartan (AZOR) 10-40 MG tablet Take 0.5 tablets by mouth daily.  [name] amoxicillin-clavulanate (AUGMENTIN) 875-125 MG tablet Take 1 tablet by mouth every 12 (twelve) hours.  [name] aspirin 81 MG tablet Take 81 mg by mouth daily.  [name] docusate sodium (COLACE) 100 MG capsule Take 100 mg by mouth daily as needed for mild constipation.   . lidocaine (XYLOCAINE) 5 % ointment Apply 1 application topically as needed.  . linaclotide (LINZESS) 145 MCG CAPS capsule Take 145 mcg by mouth daily before breakfast.   . metoprolol tartrate (LOPRESSOR) 25 MG tablet Take 1 tablet (25 mg total) by mouth 2 (two) times daily.  . polyethylene glycol powder (MIRALAX) powder Take 17 g by mouth daily as needed for moderate constipation or severe  constipation.  . QUEtiapine (SEROQUEL) 25 MG tablet Take 0.5 tablets (12.5 mg total) by mouth at bedtime.   No facility-administered encounter medications on file as of 06/07/2020.    Follow Up Plan: Embedded care coordination team will continue to follow patient progress and assist with care coordination needs post discharge.    [name], BSW, CDP Social Worker, Certified Dementia Practitioner [employer] [facility] [phone]

## 2020-06-08 ENCOUNTER — Non-Acute Institutional Stay (SKILLED_NURSING_FACILITY): Payer: Medicare PPO | Admitting: Family

## 2020-06-08 ENCOUNTER — Encounter: Payer: Self-pay | Admitting: Family

## 2020-06-08 DIAGNOSIS — L539 Erythematous condition, unspecified: Secondary | ICD-10-CM | POA: Diagnosis not present

## 2020-06-08 DIAGNOSIS — R600 Localized edema: Secondary | ICD-10-CM

## 2020-06-09 ENCOUNTER — Encounter: Payer: Self-pay | Admitting: Internal Medicine

## 2020-06-09 ENCOUNTER — Non-Acute Institutional Stay (SKILLED_NURSING_FACILITY): Payer: Medicare PPO | Admitting: Internal Medicine

## 2020-06-09 DIAGNOSIS — R410 Disorientation, unspecified: Secondary | ICD-10-CM | POA: Diagnosis not present

## 2020-06-09 DIAGNOSIS — L03115 Cellulitis of right lower limb: Secondary | ICD-10-CM | POA: Diagnosis not present

## 2020-06-09 DIAGNOSIS — L97811 Non-pressure chronic ulcer of other part of right lower leg limited to breakdown of skin: Secondary | ICD-10-CM

## 2020-06-09 DIAGNOSIS — I872 Venous insufficiency (chronic) (peripheral): Secondary | ICD-10-CM | POA: Diagnosis not present

## 2020-06-09 DIAGNOSIS — L97519 Non-pressure chronic ulcer of other part of right foot with unspecified severity: Secondary | ICD-10-CM

## 2020-06-09 DIAGNOSIS — I739 Peripheral vascular disease, unspecified: Secondary | ICD-10-CM | POA: Diagnosis not present

## 2020-06-09 MED ORDER — ACETAMINOPHEN 325 MG PO TABS: 650.0000 mg | ORAL_TABLET | Freq: Four times a day (QID) | ORAL | Status: AC | PRN

## 2020-06-09 MED ORDER — DOCUSATE SODIUM 100 MG PO CAPS: 100.0000 mg | ORAL_CAPSULE | Freq: Every day | ORAL | 0 refills | Status: AC | PRN

## 2020-06-09 MED ORDER — QUETIAPINE FUMARATE 25 MG PO TABS: 12.5000 mg | ORAL_TABLET | Freq: Every day | ORAL | Status: DC

## 2020-06-09 MED ORDER — POLYETHYLENE GLYCOL 3350 17 GM/SCOOP PO POWD: 17.0000 g | Freq: Every day | ORAL | 0 refills | Status: DC | PRN

## 2020-06-09 MED ORDER — METOPROLOL TARTRATE 25 MG PO TABS: 25.0000 mg | ORAL_TABLET | Freq: Two times a day (BID) | ORAL | 2 refills | Status: AC

## 2020-06-09 MED ORDER — POLYETHYLENE GLYCOL 3350 17 GM/SCOOP PO POWD: 17.0000 g | Freq: Every day | ORAL | 0 refills | Status: AC | PRN

## 2020-06-09 MED ORDER — AMLODIPINE-OLMESARTAN 10-40 MG PO TABS: 0.5000 | ORAL_TABLET | Freq: Every day | ORAL | 1 refills | Status: DC

## 2020-06-09 MED ORDER — LIDOCAINE 5 % EX OINT: 1.0000 "application " | TOPICAL_OINTMENT | CUTANEOUS | 0 refills | Status: DC | PRN

## 2020-06-09 MED ORDER — LINACLOTIDE 145 MCG PO CAPS: 145.0000 ug | ORAL_CAPSULE | Freq: Every day | ORAL | 0 refills | Status: DC
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
## 2020-06-12 NOTE — Telephone Encounter (Signed)
This encounter was created in error - please disregard.

## 2020-06-14 ENCOUNTER — Ambulatory Visit: Payer: Medicare PPO

## 2020-06-14 DIAGNOSIS — N182 Chronic kidney disease, stage 2 (mild): Secondary | ICD-10-CM

## 2020-06-14 DIAGNOSIS — J449 Chronic obstructive pulmonary disease, unspecified: Secondary | ICD-10-CM

## 2020-06-14 DIAGNOSIS — I10 Essential (primary) hypertension: Secondary | ICD-10-CM

## 2020-06-14 DIAGNOSIS — L03115 Cellulitis of right lower limb: Secondary | ICD-10-CM

## 2020-06-14 NOTE — Chronic Care Management (AMB) (Signed)
Chronic Care Management    Social Work Follow Up Note  06/14/2020 Name: Geoffrey Frank MRN: [id number] DOB: 06/07/1926  Geoffrey Frank is a 86 y.o. year old male who is a primary care patient of Glendale Chard, MD. The CCM team was consulted for assistance with care coordination.   Review of patient status, including review of consultants reports, other relevant assessments, and collaboration with appropriate care team members and the patient's provider was performed as part of comprehensive patient evaluation and provision of chronic care management services.    SDOH (Social Determinants of Health) assessments performed: No    Outpatient Encounter Medications as of 06/14/2020  Medication Sig  . acetaminophen (TYLENOL) 325 MG tablet Take 2 tablets (650 mg total) by mouth every 6 (six) hours as needed for mild pain (or Fever >/= 101).  [name] amLODipine-olmesartan (AZOR) 10-40 MG tablet Take 0.5 tablets by mouth daily.  [name] aspirin 81 MG tablet Take 81 mg by mouth daily.  [name] docusate sodium (COLACE) 100 MG capsule Take 1 capsule (100 mg total) by mouth daily as needed for mild constipation.  . lidocaine (XYLOCAINE) 5 % ointment Apply 1 application topically as needed.  . linaclotide (LINZESS) 145 MCG CAPS capsule Take 1 capsule (145 mcg total) by mouth daily before breakfast.  . metoprolol tartrate (LOPRESSOR) 25 MG tablet Take 1 tablet (25 mg total) by mouth 2 (two) times daily.  . polyethylene glycol powder (MIRALAX) 17 GM/SCOOP powder Take 17 g by mouth daily as needed for moderate constipation or severe constipation.  . QUEtiapine (SEROQUEL) 25 MG tablet Take 0.5 tablets (12.5 mg total) by mouth at bedtime.   No facility-administered encounter medications on file as of 06/14/2020.     Goals Addressed            This Visit's Progress   . Collaborate with RN Care Manager to perform appropriate assessments to assist with care coordination needs       CARE PLAN ENTRY (see longitudinal plan  of care for additional care plan information)  Current Barriers:  . ADL IADL limitations . Social Isolation . Limited access to caregiver . Recent inpatient stay from 05/18/20 - 05/22/20 due to Cellulitis which resulted in a SNF stay for Rehab from 05/22/20 - 06/13/20 . Chronic conditions including COPD, HTN, and CKD II which put patient at an increased risk for hospitalization  Social Work Clinical Goal(s):  [name] Over the next 90 days the patient will work with SW to address ongoing care coordination needs post SNF discharge  CCM SW Interventions: Completed 06/14/20 . Inter-disciplinary care team collaboration (see longitudinal plan of care) . Performed chart review to note health plan issued a notice indicating SNF days would no longer be covered by health plan . Successful outbound call placed to the patient to assess care coordination needs o Patient reports he returned home late afternoon of 10/5 o Patients grand-daughter is staying in the home to assist with care needs at this time  o Patient reports he has a niece in [place] who may come assist with patient care in the home if needed . Discussed patient referral to [facility] for Pt, OT, RN, and aid.  o Patient reports he has not yet been contacted to schedule start of care o Advised the patient SW will outreach on 06/15/20 to confirm contacted by [facility]ings - If the patient has yet to receive a call, SW will collaborate with Alvis Lemmings to confirm plans for start of care . Collaboration with  Larena Glassman RN with [facility]  o Confirmed [facility] is aware of patients return home and would be receiving a visit by the RN by end of business today (4/6) o Discussed Larena Glassman is no longer the assigned RN as assignments have been altered based on service zip codes . Collaboration with [facility] to inform of patients current disposition and plan  Patient Self Care Activities:  . Self administers medications as prescribed . Attends all  scheduled provider appointments . Calls provider office for new concerns or questions . Supportive family to assist with patient care needs  Initial goal documentation         Follow Up Plan: SW will follow up with patient by phone over the next day.   [name], BSW, CDP Social Worker, Certified Dementia Practitioner [place] [facility] [place] [facility] [phone]  Total time spent performing care coordination and/or care management activities with the patient by phone or face to face = 15 minutes.

## 2020-06-14 NOTE — Patient Instructions (Signed)
Social Worker Visit Information  Goals we discussed today:  Goals Addressed            This Visit's Progress    Collaborate with RN Care Manager to perform appropriate assessments to assist with care coordination needs       CARE PLAN ENTRY (see longitudinal plan of care for additional care plan information)  Current Barriers:   ADL IADL limitations  Social Isolation  Limited access to caregiver  Recent inpatient stay from 05/18/20 - 05/22/20 due to Cellulitis which resulted in a SNF stay for Rehab from 05/22/20 - 06/13/20  Chronic conditions including COPD, HTN, and CKD II which put patient at an increased risk for hospitalization  Social Work Clinical Goal(s):   Over the next 90 days the patient will work with SW to address ongoing care coordination needs post SNF discharge  CCM SW Interventions: Completed 06/14/20  Inter-disciplinary care team collaboration (see longitudinal plan of care)  Performed chart review to note health plan issued a notice indicating SNF days would no longer be covered by health plan  Successful outbound call placed to the patient to assess care coordination needs o Patient reports he returned home late afternoon of 10/5 o Patients grand-daughter is staying in the home to assist with care needs at this time  o Patient reports he has a niece in [place] who may come assist with patient care in the home if needed  Discussed patient referral to [facility] for Pt, OT, RN, and aid.  o Patient reports he has not yet been contacted to schedule start of care o Advised the patient SW will outreach on 06/15/20 to confirm contacted by Alvis Lemmings - If the patient has yet to receive a call, SW will collaborate with Alvis Lemmings to confirm plans for start of care  Collaboration with Larena Glassman RN with [facility]  o Confirmed [facility] is aware of patients return home and would be receiving a visit by the RN by end of business today (4/6) o Discussed Larena Glassman is no longer  the assigned RN as assignments have been altered based on service zip codes  Collaboration with [place] to inform of patients current disposition and plan  Patient Self Care Activities:   Self administers medications as prescribed  Attends all scheduled provider appointments  Calls provider office for new concerns or questions  Supportive family to assist with patient care needs  Initial goal documentation         Follow Up Plan: SW will follow up with patient by phone over the next day.   [name], BSW, CDP Social Worker, Certified Dementia Practitioner [place] [facility] [place] [facility] [phone]

## 2020-06-15 ENCOUNTER — Ambulatory Visit: Payer: Medicare PPO

## 2020-06-15 DIAGNOSIS — L89152 Pressure ulcer of sacral region, stage 2: Secondary | ICD-10-CM | POA: Diagnosis not present

## 2020-06-15 DIAGNOSIS — D631 Anemia in chronic kidney disease: Secondary | ICD-10-CM | POA: Diagnosis not present

## 2020-06-15 DIAGNOSIS — I70203 Unspecified atherosclerosis of native arteries of extremities, bilateral legs: Secondary | ICD-10-CM | POA: Diagnosis not present

## 2020-06-15 DIAGNOSIS — I872 Venous insufficiency (chronic) (peripheral): Secondary | ICD-10-CM | POA: Diagnosis not present

## 2020-06-15 DIAGNOSIS — J449 Chronic obstructive pulmonary disease, unspecified: Secondary | ICD-10-CM

## 2020-06-15 DIAGNOSIS — I130 Hypertensive heart and chronic kidney disease with heart failure and stage 1 through stage 4 chronic kidney disease, or unspecified chronic kidney disease: Secondary | ICD-10-CM | POA: Diagnosis not present

## 2020-06-15 DIAGNOSIS — I5032 Chronic diastolic (congestive) heart failure: Secondary | ICD-10-CM | POA: Diagnosis not present

## 2020-06-15 DIAGNOSIS — E1122 Type 2 diabetes mellitus with diabetic chronic kidney disease: Secondary | ICD-10-CM | POA: Diagnosis not present

## 2020-06-15 DIAGNOSIS — I10 Essential (primary) hypertension: Secondary | ICD-10-CM

## 2020-06-15 DIAGNOSIS — E1151 Type 2 diabetes mellitus with diabetic peripheral angiopathy without gangrene: Secondary | ICD-10-CM | POA: Diagnosis not present

## 2020-06-15 DIAGNOSIS — N182 Chronic kidney disease, stage 2 (mild): Secondary | ICD-10-CM | POA: Diagnosis not present

## 2020-06-15 DIAGNOSIS — L03115 Cellulitis of right lower limb: Secondary | ICD-10-CM

## 2020-06-15 NOTE — Patient Instructions (Signed)
Social Worker Visit Information  Goals we discussed today:  Goals Addressed            This Visit's Progress   . Collaborate with RN Care Manager to perform appropriate assessments to assist with care coordination needs   On track    [place] (see longitudinal plan of care for additional care plan information)  Current Barriers:  . ADL IADL limitations . Social Isolation . Limited access to caregiver . Recent inpatient stay from 05/18/20 - 05/22/20 due to Cellulitis which resulted in a SNF stay for Rehab from 05/22/20 - 06/13/20 . Chronic conditions including COPD, HTN, and CKD II which put patient at an increased risk for hospitalization  Social Work Clinical Goal(s):  [name] Over the next 90 days the patient will work with SW to address ongoing care coordination needs post SNF discharge  CCM SW Interventions: Completed 06/15/20 . Successful outbound call placed to the patient to confirm contact with home health o The patient reports his son was contacted and advised someone would visit the patients home today for start of care . Scheduled follow up call over the next week . Advised the patient to contact SW if assistance is needed prior to follow up call  Completed 06/14/20 . Inter-disciplinary care team collaboration (see longitudinal plan of care) . Performed chart review to note health plan issued a notice indicating SNF days would no longer be covered by health plan . Successful outbound call placed to the patient to assess care coordination needs o Patient reports he returned home late afternoon of 10/5 o Patients grand-daughter is staying in the home to assist with care needs at this time  o Patient reports he has a niece in [place] who may come assist with patient care in the home if needed . Discussed patient referral to [facility] for Pt, OT, RN, and aid.  o Patient reports he has not yet been contacted to schedule start of care o Advised the patient SW will outreach on  06/15/20 to confirm contacted by Al[facility] Lemmings - If the patient has yet to receive a call, SW will collaborate with Alvis Lemmings to confirm plans for start of care . Collaboration with Larena Glassman RN with [facility]  o Confirmed [facility] is aware of patients return home and would be receiving a visit by the RN by end of business today (4/6) o Discussed Larena Glassman is no longer the assigned RN as assignments have been altered based on service zip codes . Collaboration with [place] to inform of patients current disposition and plan  Patient Self Care Activities:  . Self administers medications as prescribed . Attends all scheduled provider appointments . Calls provider office for new concerns or questions . Supportive family to assist with patient care needs  Please see past updates related to this goal by clicking on the "Past Updates" button in the selected goal         Follow Up Plan: SW will follow up with patient by phone over the next week   [name], BSW, CDP Social Worker, Certified Dementia Practitioner [place] [facility] [place] [facility] [phone]

## 2020-06-15 NOTE — Chronic Care Management (AMB) (Signed)
Chronic Care Management    Social Work Follow Up Note  06/15/2020 Name: Geoffrey Frank MRN: [id number] DOB: [birth year]  Geoffrey Frank is a 86 y.o. year old male who is a primary care patient of Glendale Chard, MD. The CCM team was consulted for assistance with care coordination.   Review of patient status, including review of consultants reports, other relevant assessments, and collaboration with appropriate care team members and the patient's provider was performed as part of comprehensive patient evaluation and provision of chronic care management services.    SDOH (Social Determinants of Health) assessments performed: No    Outpatient Encounter Medications as of 06/15/2020  Medication Sig  . acetaminophen (TYLENOL) 325 MG tablet Take 2 tablets (650 mg total) by mouth every 6 (six) hours as needed for mild pain (or Fever >/= 101).  [name] amLODipine-olmesartan (AZOR) 10-40 MG tablet Take 0.5 tablets by mouth daily.  [name] aspirin 81 MG tablet Take 81 mg by mouth daily.  [name] docusate sodium (COLACE) 100 MG capsule Take 1 capsule (100 mg total) by mouth daily as needed for mild constipation.  . lidocaine (XYLOCAINE) 5 % ointment Apply 1 application topically as needed.  . linaclotide (LINZESS) 145 MCG CAPS capsule Take 1 capsule (145 mcg total) by mouth daily before breakfast.  . metoprolol tartrate (LOPRESSOR) 25 MG tablet Take 1 tablet (25 mg total) by mouth 2 (two) times daily.  . polyethylene glycol powder (MIRALAX) 17 GM/SCOOP powder Take 17 g by mouth daily as needed for moderate constipation or severe constipation.  . QUEtiapine (SEROQUEL) 25 MG tablet Take 0.5 tablets (12.5 mg total) by mouth at bedtime.   No facility-administered encounter medications on file as of 06/15/2020.     Goals Addressed            This Visit's Progress   . Collaborate with RN Care Manager to perform appropriate assessments to assist with care coordination needs   On track    [place] (see  longitudinal plan of care for additional care plan information)  Current Barriers:  . ADL IADL limitations . Social Isolation . Limited access to caregiver . Recent inpatient stay from 05/18/20 - 05/22/20 due to Cellulitis which resulted in a SNF stay for Rehab from 05/22/20 - 06/13/20 . Chronic conditions including COPD, HTN, and CKD II which put patient at an increased risk for hospitalization  Social Work Clinical Goal(s):  [name] Over the next 90 days the patient will work with SW to address ongoing care coordination needs post SNF discharge  CCM SW Interventions: Completed 06/15/20 . Successful outbound call placed to the patient to confirm contact with home health o The patient reports his son was contacted and advised someone would visit the patients home today for start of care . Scheduled follow up call over the next week . Advised the patient to contact SW if assistance is needed prior to follow up call  Completed 06/14/20 . Inter-disciplinary care team collaboration (see longitudinal plan of care) . Performed chart review to note health plan issued a notice indicating SNF days would no longer be covered by health plan . Successful outbound call placed to the patient to assess care coordination needs o Patient reports he returned home late afternoon of 10/5 o Patients grand-daughter is staying in the home to assist with care needs at this time  o Patient reports he has a niece in [place] who may come assist with patient care in the home if needed . Discussed  patient referral to [facility] for Pt, OT, RN, and aid.  o Patient reports he has not yet been contacted to schedule start of care o Advised the patient SW will outreach on 06/15/20 to confirm contacted by Alvis Lemmings - If the patient has yet to receive a call, SW will collaborate with Alvis Lemmings to confirm plans for start of care . Collaboration with Larena Glassman RN with [facility]  o Confirmed [facility] is aware of patients return home and  would be receiving a visit by the RN by end of business today (4/6) o Discussed Larena Glassman is no longer the assigned RN as assignments have been altered based on service zip codes . Collaboration with [place] to inform of patients current disposition and plan  Patient Self Care Activities:  . Self administers medications as prescribed . Attends all scheduled provider appointments . Calls provider office for new concerns or questions . Supportive family to assist with patient care needs  Please see past updates related to this goal by clicking on the "Past Updates" button in the selected goal          Follow Up Plan: SW will follow up with patient by phone over the next week.   [name], BSW, CDP Social Worker, Certified Dementia Practitioner [place] [facility] [place] [facility] [phone]  Total time spent performing care coordination and/or care management activities with the patient by phone or face to face = 7 minutes.

## 2020-06-16 ENCOUNTER — Telehealth: Payer: Self-pay

## 2020-06-16 DIAGNOSIS — I5032 Chronic diastolic (congestive) heart failure: Secondary | ICD-10-CM | POA: Diagnosis not present

## 2020-06-16 DIAGNOSIS — I130 Hypertensive heart and chronic kidney disease with heart failure and stage 1 through stage 4 chronic kidney disease, or unspecified chronic kidney disease: Secondary | ICD-10-CM | POA: Diagnosis not present

## 2020-06-16 DIAGNOSIS — I70203 Unspecified atherosclerosis of native arteries of extremities, bilateral legs: Secondary | ICD-10-CM | POA: Diagnosis not present

## 2020-06-16 DIAGNOSIS — D631 Anemia in chronic kidney disease: Secondary | ICD-10-CM | POA: Diagnosis not present

## 2020-06-16 DIAGNOSIS — I872 Venous insufficiency (chronic) (peripheral): Secondary | ICD-10-CM | POA: Diagnosis not present

## 2020-06-16 DIAGNOSIS — E1151 Type 2 diabetes mellitus with diabetic peripheral angiopathy without gangrene: Secondary | ICD-10-CM | POA: Diagnosis not present

## 2020-06-16 DIAGNOSIS — L89152 Pressure ulcer of sacral region, stage 2: Secondary | ICD-10-CM | POA: Diagnosis not present

## 2020-06-16 DIAGNOSIS — N182 Chronic kidney disease, stage 2 (mild): Secondary | ICD-10-CM | POA: Diagnosis not present

## 2020-06-16 DIAGNOSIS — E1122 Type 2 diabetes mellitus with diabetic chronic kidney disease: Secondary | ICD-10-CM | POA: Diagnosis not present

## 2020-06-16 NOTE — Telephone Encounter (Signed)
Called patient to check on him to see how he is doing since dc from rehab. He was dc on Monday. Pt is currently complaining of diarrhea. Pt was dc with linzess miralax and colace. Pt advised to stop miralax and colace and take linzess Monday Wednesday and Friday. Pt is coming for appt on 06/20/20. Pt said that he is doing fine outside of the diarrhea. He doesn't feel any weaker than normal.
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
## 2020-06-20 ENCOUNTER — Encounter: Payer: Self-pay | Admitting: Nurse Practitioner

## 2020-06-20 ENCOUNTER — Ambulatory Visit (INDEPENDENT_AMBULATORY_CARE_PROVIDER_SITE_OTHER): Payer: Medicare PPO | Admitting: Nurse Practitioner

## 2020-06-20 ENCOUNTER — Other Ambulatory Visit: Payer: Self-pay

## 2020-06-20 VITALS — BP 112/80 | HR 104 | Temp 98.4°F | Ht 73.0 in | Wt 156.0 lb

## 2020-06-20 DIAGNOSIS — L03115 Cellulitis of right lower limb: Secondary | ICD-10-CM

## 2020-06-20 DIAGNOSIS — S41112A Laceration without foreign body of left upper arm, initial encounter: Secondary | ICD-10-CM

## 2020-06-20 DIAGNOSIS — I739 Peripheral vascular disease, unspecified: Secondary | ICD-10-CM | POA: Diagnosis not present
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
## 2020-06-20 NOTE — Patient Instructions (Signed)
MOST form completed

## 2020-06-21 ENCOUNTER — Ambulatory Visit: Payer: Medicare PPO

## 2020-06-21 DIAGNOSIS — J449 Chronic obstructive pulmonary disease, unspecified: Secondary | ICD-10-CM

## 2020-06-21 DIAGNOSIS — E1151 Type 2 diabetes mellitus with diabetic peripheral angiopathy without gangrene: Secondary | ICD-10-CM | POA: Diagnosis not present

## 2020-06-21 DIAGNOSIS — L03115 Cellulitis of right lower limb: Secondary | ICD-10-CM

## 2020-06-21 DIAGNOSIS — D631 Anemia in chronic kidney disease: Secondary | ICD-10-CM | POA: Diagnosis not present

## 2020-06-21 DIAGNOSIS — I10 Essential (primary) hypertension: Secondary | ICD-10-CM

## 2020-06-21 DIAGNOSIS — I130 Hypertensive heart and chronic kidney disease with heart failure and stage 1 through stage 4 chronic kidney disease, or unspecified chronic kidney disease: Secondary | ICD-10-CM | POA: Diagnosis not present

## 2020-06-21 DIAGNOSIS — L89152 Pressure ulcer of sacral region, stage 2: Secondary | ICD-10-CM | POA: Diagnosis not present

## 2020-06-21 DIAGNOSIS — I5032 Chronic diastolic (congestive) heart failure: Secondary | ICD-10-CM | POA: Diagnosis not present

## 2020-06-21 DIAGNOSIS — I70203 Unspecified atherosclerosis of native arteries of extremities, bilateral legs: Secondary | ICD-10-CM | POA: Diagnosis not present

## 2020-06-21 DIAGNOSIS — N182 Chronic kidney disease, stage 2 (mild): Secondary | ICD-10-CM | POA: Diagnosis not present

## 2020-06-21 DIAGNOSIS — I872 Venous insufficiency (chronic) (peripheral): Secondary | ICD-10-CM | POA: Diagnosis not present

## 2020-06-21 DIAGNOSIS — E1122 Type 2 diabetes mellitus with diabetic chronic kidney disease: Secondary | ICD-10-CM | POA: Diagnosis not present

## 2020-06-22 DIAGNOSIS — N182 Chronic kidney disease, stage 2 (mild): Secondary | ICD-10-CM | POA: Diagnosis not present

## 2020-06-22 DIAGNOSIS — M5136 Other intervertebral disc degeneration, lumbar region: Secondary | ICD-10-CM | POA: Diagnosis not present

## 2020-06-22 DIAGNOSIS — I130 Hypertensive heart and chronic kidney disease with heart failure and stage 1 through stage 4 chronic kidney disease, or unspecified chronic kidney disease: Secondary | ICD-10-CM | POA: Diagnosis not present

## 2020-06-22 DIAGNOSIS — D631 Anemia in chronic kidney disease: Secondary | ICD-10-CM | POA: Diagnosis not present

## 2020-06-22 DIAGNOSIS — L89152 Pressure ulcer of sacral region, stage 2: Secondary | ICD-10-CM | POA: Diagnosis not present

## 2020-06-22 DIAGNOSIS — E1122 Type 2 diabetes mellitus with diabetic chronic kidney disease: Secondary | ICD-10-CM | POA: Diagnosis not present

## 2020-06-22 DIAGNOSIS — I5032 Chronic diastolic (congestive) heart failure: Secondary | ICD-10-CM | POA: Diagnosis not present

## 2020-06-22 DIAGNOSIS — E1151 Type 2 diabetes mellitus with diabetic peripheral angiopathy without gangrene: Secondary | ICD-10-CM | POA: Diagnosis not present

## 2020-06-22 DIAGNOSIS — M19032 Primary osteoarthritis, left wrist: Secondary | ICD-10-CM | POA: Diagnosis not present

## 2020-06-22 DIAGNOSIS — I872 Venous insufficiency (chronic) (peripheral): Secondary | ICD-10-CM | POA: Diagnosis not present

## 2020-06-22 DIAGNOSIS — I70203 Unspecified atherosclerosis of native arteries of extremities, bilateral legs: Secondary | ICD-10-CM | POA: Diagnosis not present

## 2020-06-22 DIAGNOSIS — M5134 Other intervertebral disc degeneration, thoracic region: Secondary | ICD-10-CM | POA: Diagnosis not present

## 2020-06-22 NOTE — Chronic Care Management (AMB) (Signed)
Chronic Care Management    Social Work Follow Up Note  06/22/2020 Name: Geoffrey Frank MRN: [id number] DOB: [birth year]  Geoffrey Frank is a 86 y.o. year old male who is a primary care patient of Geoffrey Chard, MD. The CCM team was consulted for assistance with care coordination.   Review of patient status, including review of consultants reports, other relevant assessments, and collaboration with appropriate care team members and the patient's provider was performed as part of comprehensive patient evaluation and provision of chronic care management services.    SDOH (Social Determinants of Health) assessments performed: No    Outpatient Encounter Medications as of 06/21/2020  Medication Sig  . acetaminophen (TYLENOL) 325 MG tablet Take 2 tablets (650 mg total) by mouth every 6 (six) hours as needed for mild pain (or Fever >/= 101).  [name] amLODipine-olmesartan (AZOR) 10-40 MG tablet Take 0.5 tablets by mouth daily.  [name] aspirin 81 MG tablet Take 81 mg by mouth daily.  [name] docusate sodium (COLACE) 100 MG capsule Take 1 capsule (100 mg total) by mouth daily as needed for mild constipation.  . lidocaine (XYLOCAINE) 5 % ointment Apply 1 application topically as needed.  . linaclotide (LINZESS) 145 MCG CAPS capsule Take 1 capsule (145 mcg total) by mouth daily before breakfast.  . metoprolol tartrate (LOPRESSOR) 25 MG tablet Take 1 tablet (25 mg total) by mouth 2 (two) times daily.  . polyethylene glycol powder (MIRALAX) 17 GM/SCOOP powder Take 17 g by mouth daily as needed for moderate constipation or severe constipation.  . QUEtiapine (SEROQUEL) 25 MG tablet Take 0.5 tablets (12.5 mg total) by mouth at bedtime. (Patient not taking: Reported on 06/20/2020)   No facility-administered encounter medications on file as of 06/21/2020.     Goals Addressed            This Visit's Progress   . Collaborate with RN Care Manager to perform appropriate assessments to assist with care coordination  needs       CARE PLAN ENTRY (see longitudinal plan of care for additional care plan information)  Current Barriers:  . ADL IADL limitations . Social Isolation . Limited access to caregiver . Recent inpatient stay from 05/18/20 - 05/22/20 due to Cellulitis which resulted in a SNF stay for Rehab from 05/22/20 - 06/13/20 . Chronic conditions including COPD, HTN, and CKD II which put patient at an increased risk for hospitalization  Social Work Clinical Goal(s):  [name] Over the next 90 days the patient will work with SW to address ongoing care coordination needs post SNF discharge  CCM SW Interventions: Completed 06/21/20 . Collaboration with Minette Brine FNP who requests SW assist with placement for long term care o Reviewed the patient is still cognitive and able to refuse placement if not desired . Successful outbound call placed to the patient to assess care coordination needs o The patient reports his grand-daughter continues to stay in his home to assist with patient care needs . Discussed opportunity for SW to assist with placement into long term care facility o Patient declined placement at this time stating he wants to remain home . Collaboration with Minette Brine FNP to inform of patient decision to remain in the home . Scheduled follow up call to the patient over the next three weeks  o Encouraged the patient to contact SW directly if assistance is needed prior to next scheduled call  Completed 06/15/20 . Successful outbound call placed to the patient to confirm contact with home health  o The patient reports his son was contacted and advised someone would visit the patients home today for start of care . Scheduled follow up call over the next week . Advised the patient to contact SW if assistance is needed prior to follow up call  Completed 06/14/20 . Inter-disciplinary care team collaboration (see longitudinal plan of care) . Performed chart review to note health plan issued a notice  indicating SNF days would no longer be covered by health plan . Successful outbound call placed to the patient to assess care coordination needs o Patient reports he returned home late afternoon of 10/5 o Patients grand-daughter is staying in the home to assist with care needs at this time  o Patient reports he has a niece in [place] who may come assist with patient care in the home if needed . Discussed patient referral to [facility] for Pt, OT, RN, and aid.  o Patient reports he has not yet been contacted to schedule start of care o Advised the patient SW will outreach on 06/15/20 to confirm contacted by Alvis Lemmings - If the patient has yet to receive a call, SW will collaborate with Alvis Lemmings to confirm plans for start of care . Collaboration with Larena Glassman RN with [facility]  o Confirmed [facility] is aware of patients return home and would be receiving a visit by the RN by end of business today (4/6) o Discussed Larena Glassman is no longer the assigned RN as assignments have been altered based on service zip codes . Collaboration with [place] to inform of patients current disposition and plan  Patient Self Care Activities:  . Self administers medications as prescribed . Attends all scheduled provider appointments . Calls provider office for new concerns or questions . Supportive family to assist with patient care needs  Please see past updates related to this goal by clicking on the "Past Updates" button in the selected goal          Follow Up Plan: SW will follow up with patient by phone over the next three weeks,   [name], BSW, CDP Social Worker, Certified Dementia Practitioner [place] [facility] [phone]  Total time spent performing care coordination and/or care management activities with the patient by phone or face to face = 15 minutes.

## 2020-06-22 NOTE — Patient Instructions (Signed)
Social Worker Visit Information  Goals we discussed today:  Goals Addressed            This Visit's Progress   . Collaborate with RN Care Manager to perform appropriate assessments to assist with care coordination needs       CARE PLAN ENTRY (see longitudinal plan of care for additional care plan information)  Current Barriers:  . ADL IADL limitations . Social Isolation . Limited access to caregiver . Recent inpatient stay from 05/18/20 - 05/22/20 due to Cellulitis which resulted in a SNF stay for Rehab from 05/22/20 - 06/13/20 . Chronic conditions including COPD, HTN, and CKD II which put patient at an increased risk for hospitalization  Social Work Clinical Goal(s):  [name] Over the next 90 days the patient will work with SW to address ongoing care coordination needs post SNF discharge  CCM SW Interventions: Completed 06/21/20 . Collaboration with Minette Brine FNP who requests SW assist with placement for long term care o Reviewed the patient is still cognitive and able to refuse placement if not desired . Successful outbound call placed to the patient to assess care coordination needs o The patient reports his grand-daughter continues to stay in his home to assist with patient care needs . Discussed opportunity for SW to assist with placement into long term care facility o Patient declined placement at this time stating he wants to remain home . Collaboration with Minette Brine FNP to inform of patient decision to remain in the home . Scheduled follow up call to the patient over the next three weeks  o Encouraged the patient to contact SW directly if assistance is needed prior to next scheduled call  Completed 06/15/20 . Successful outbound call placed to the patient to confirm contact with home health o The patient reports his son was contacted and advised someone would visit the patients home today for start of care . Scheduled follow up call over the next week . Advised the patient  to contact SW if assistance is needed prior to follow up call  Completed 06/14/20 . Inter-disciplinary care team collaboration (see longitudinal plan of care) . Performed chart review to note health plan issued a notice indicating SNF days would no longer be covered by health plan . Successful outbound call placed to the patient to assess care coordination needs o Patient reports he returned home late afternoon of 10/5 o Patients grand-daughter is staying in the home to assist with care needs at this time  o Patient reports he has a niece in [place] who may come assist with patient care in the home if needed . Discussed patient referral to [facility] for Pt, OT, RN, and aid.  o Patient reports he has not yet been contacted to schedule start of care o Advised the patient SW will outreach on 06/15/20 to confirm contacted by Alvis Lemmings - If the patient has yet to receive a call, SW will collaborate with Alvis Lemmings to confirm plans for start of care . Collaboration with Larena Glassman RN with [facility]  o Confirmed [facility] is aware of patients return home and would be receiving a visit by the RN by end of business today (4/6) o Discussed Larena Glassman is no longer the assigned RN as assignments have been altered based on service zip codes . Collaboration with [place] to inform of patients current disposition and plan  Patient Self Care Activities:  . Self administers medications as prescribed . Attends all scheduled provider appointments .  Calls provider office for new concerns or questions . Supportive family to assist with patient care needs  Please see past updates related to this goal by clicking on the "Past Updates" button in the selected goal          Follow Up Plan: SW will follow up with patient by phone over the next three weeks   [name], BSW, CDP Social Worker, Certified Dementia Practitioner [place] [facility] [facility] [phone]

## 2020-06-23 DIAGNOSIS — D631 Anemia in chronic kidney disease: Secondary | ICD-10-CM | POA: Diagnosis not present

## 2020-06-23 DIAGNOSIS — I872 Venous insufficiency (chronic) (peripheral): Secondary | ICD-10-CM | POA: Diagnosis not present

## 2020-06-23 DIAGNOSIS — N182 Chronic kidney disease, stage 2 (mild): Secondary | ICD-10-CM | POA: Diagnosis not present

## 2020-06-23 DIAGNOSIS — I130 Hypertensive heart and chronic kidney disease with heart failure and stage 1 through stage 4 chronic kidney disease, or unspecified chronic kidney disease: Secondary | ICD-10-CM | POA: Diagnosis not present

## 2020-06-23 DIAGNOSIS — E1151 Type 2 diabetes mellitus with diabetic peripheral angiopathy without gangrene: Secondary | ICD-10-CM | POA: Diagnosis not present

## 2020-06-23 DIAGNOSIS — E1122 Type 2 diabetes mellitus with diabetic chronic kidney disease: Secondary | ICD-10-CM | POA: Diagnosis not present

## 2020-06-23 DIAGNOSIS — I5032 Chronic diastolic (congestive) heart failure: Secondary | ICD-10-CM | POA: Diagnosis not present

## 2020-06-23 DIAGNOSIS — I70203 Unspecified atherosclerosis of native arteries of extremities, bilateral legs: Secondary | ICD-10-CM | POA: Diagnosis not present

## 2020-06-23 DIAGNOSIS — L89152 Pressure ulcer of sacral region, stage 2: Secondary | ICD-10-CM | POA: Diagnosis not present

## 2020-06-26 ENCOUNTER — Telehealth: Payer: Self-pay

## 2020-06-26 ENCOUNTER — Telehealth: Payer: Self-pay | Admitting: Orthopaedic Surgery

## 2020-06-26 NOTE — Telephone Encounter (Signed)
The pt's niece Butch Penny was notified that the pt's MRI was ordered by dr. Rodell Perna and that she would need to call their office to get the results.

## 2020-06-26 NOTE — Telephone Encounter (Signed)
I called Geoffrey Frank. Patient had xrays done at home by portable xray machine last week after fall. I explained that there was confusion because the message was for MRI results. There is a MRI order in the system by Dr. Lorin Mercy, however, that was in 2016. She will call PCP back to see if they have x-ray report from last week.

## 2020-06-26 NOTE — Telephone Encounter (Signed)
Patient's niece Butch Penny called to get MRI results read over the phone for patient. Patient is bed written at this time and need results read over the phone. Please call patient and niece Butch Penny with results at [phone].

## 2020-06-27 DIAGNOSIS — I70203 Unspecified atherosclerosis of native arteries of extremities, bilateral legs: Secondary | ICD-10-CM | POA: Diagnosis not present

## 2020-06-27 DIAGNOSIS — I130 Hypertensive heart and chronic kidney disease with heart failure and stage 1 through stage 4 chronic kidney disease, or unspecified chronic kidney disease: Secondary | ICD-10-CM | POA: Diagnosis not present

## 2020-06-27 DIAGNOSIS — D631 Anemia in chronic kidney disease: Secondary | ICD-10-CM | POA: Diagnosis not present

## 2020-06-27 DIAGNOSIS — E1122 Type 2 diabetes mellitus with diabetic chronic kidney disease: Secondary | ICD-10-CM | POA: Diagnosis not present

## 2020-06-27 DIAGNOSIS — I5032 Chronic diastolic (congestive) heart failure: Secondary | ICD-10-CM | POA: Diagnosis not present

## 2020-06-27 DIAGNOSIS — I872 Venous insufficiency (chronic) (peripheral): Secondary | ICD-10-CM | POA: Diagnosis not present

## 2020-06-27 DIAGNOSIS — L89152 Pressure ulcer of sacral region, stage 2: Secondary | ICD-10-CM | POA: Diagnosis not present

## 2020-06-27 DIAGNOSIS — N182 Chronic kidney disease, stage 2 (mild): Secondary | ICD-10-CM | POA: Diagnosis not present

## 2020-06-27 DIAGNOSIS — E1151 Type 2 diabetes mellitus with diabetic peripheral angiopathy without gangrene: Secondary | ICD-10-CM | POA: Diagnosis not present

## 2020-06-28 ENCOUNTER — Telehealth: Payer: Self-pay

## 2020-06-29 ENCOUNTER — Emergency Department (HOSPITAL_COMMUNITY): Payer: Medicare PPO

## 2020-06-29 ENCOUNTER — Emergency Department (HOSPITAL_COMMUNITY)
Admission: EM | Admit: 2020-06-29 | Discharge: 2020-06-29 | Disposition: A | Discharge status: Home / Self Care | Payer: Medicare PPO | Attending: Emergency Medicine | Admitting: Emergency Medicine

## 2020-06-29 ENCOUNTER — Encounter (HOSPITAL_COMMUNITY): Payer: Self-pay

## 2020-06-29 ENCOUNTER — Other Ambulatory Visit: Payer: Self-pay

## 2020-06-29 DIAGNOSIS — J189 Pneumonia, unspecified organism: Secondary | ICD-10-CM | POA: Diagnosis not present

## 2020-06-29 DIAGNOSIS — W19XXXA Unspecified fall, initial encounter: Secondary | ICD-10-CM | POA: Diagnosis not present

## 2020-06-29 DIAGNOSIS — R609 Edema, unspecified: Secondary | ICD-10-CM | POA: Diagnosis not present

## 2020-06-29 DIAGNOSIS — E1122 Type 2 diabetes mellitus with diabetic chronic kidney disease: Secondary | ICD-10-CM | POA: Diagnosis not present

## 2020-06-29 DIAGNOSIS — L8995 Pressure ulcer of unspecified site, unstageable: Secondary | ICD-10-CM | POA: Diagnosis not present

## 2020-06-29 DIAGNOSIS — Z79899 Other long term (current) drug therapy: Secondary | ICD-10-CM | POA: Insufficient documentation

## 2020-06-29 DIAGNOSIS — R531 Weakness: Secondary | ICD-10-CM | POA: Diagnosis not present

## 2020-06-29 DIAGNOSIS — J449 Chronic obstructive pulmonary disease, unspecified: Secondary | ICD-10-CM | POA: Insufficient documentation

## 2020-06-29 DIAGNOSIS — Z7982 Long term (current) use of aspirin: Secondary | ICD-10-CM | POA: Diagnosis not present

## 2020-06-29 DIAGNOSIS — N182 Chronic kidney disease, stage 2 (mild): Secondary | ICD-10-CM | POA: Insufficient documentation

## 2020-06-29 DIAGNOSIS — I5032 Chronic diastolic (congestive) heart failure: Secondary | ICD-10-CM | POA: Insufficient documentation

## 2020-06-29 DIAGNOSIS — Z8546 Personal history of malignant neoplasm of prostate: Secondary | ICD-10-CM | POA: Insufficient documentation

## 2020-06-29 DIAGNOSIS — I491 Atrial premature depolarization: Secondary | ICD-10-CM | POA: Diagnosis not present

## 2020-06-29 DIAGNOSIS — D631 Anemia in chronic kidney disease: Secondary | ICD-10-CM | POA: Diagnosis not present

## 2020-06-29 DIAGNOSIS — J90 Pleural effusion, not elsewhere classified: Secondary | ICD-10-CM | POA: Diagnosis not present

## 2020-06-29 DIAGNOSIS — Z8551 Personal history of malignant neoplasm of bladder: Secondary | ICD-10-CM | POA: Insufficient documentation

## 2020-06-29 DIAGNOSIS — I130 Hypertensive heart and chronic kidney disease with heart failure and stage 1 through stage 4 chronic kidney disease, or unspecified chronic kidney disease: Secondary | ICD-10-CM | POA: Diagnosis not present

## 2020-06-29 DIAGNOSIS — Z87891 Personal history of nicotine dependence: Secondary | ICD-10-CM | POA: Diagnosis not present

## 2020-06-29 DIAGNOSIS — Z20822 Contact with and (suspected) exposure to covid-19: Secondary | ICD-10-CM | POA: Insufficient documentation

## 2020-06-29 DIAGNOSIS — I10 Essential (primary) hypertension: Secondary | ICD-10-CM | POA: Diagnosis not present

## 2020-06-29 DIAGNOSIS — E1151 Type 2 diabetes mellitus with diabetic peripheral angiopathy without gangrene: Secondary | ICD-10-CM | POA: Diagnosis not present

## 2020-06-29 DIAGNOSIS — I872 Venous insufficiency (chronic) (peripheral): Secondary | ICD-10-CM | POA: Diagnosis not present

## 2020-06-29 DIAGNOSIS — Z96643 Presence of artificial hip joint, bilateral: Secondary | ICD-10-CM | POA: Diagnosis not present

## 2020-06-29 DIAGNOSIS — L89152 Pressure ulcer of sacral region, stage 2: Secondary | ICD-10-CM | POA: Diagnosis not present

## 2020-06-29 DIAGNOSIS — I70203 Unspecified atherosclerosis of native arteries of extremities, bilateral legs: Secondary | ICD-10-CM | POA: Diagnosis not present

## 2020-06-29 LAB — URINALYSIS, ROUTINE W REFLEX MICROSCOPIC
Bilirubin Urine: NEGATIVE
Glucose, UA: NEGATIVE mg/dL
Ketones, ur: NEGATIVE mg/dL
Nitrite: NEGATIVE
Specific Gravity, Urine: >1.03 — ABNORMAL HIGH (ref 1.005–1.030)
pH: 6 (ref 5.0–8.0)

## 2020-06-29 LAB — CBC WITH DIFFERENTIAL/PLATELET
Abs Immature Granulocytes: 0.01 10*3/uL (ref 0.00–0.07)
Basophils Absolute: 0 10*3/uL (ref 0.0–0.1)
Basophils Relative: 1 %
Eosinophils Absolute: 0.1 10*3/uL (ref 0.0–0.5)
Eosinophils Relative: 1 %
HCT: 29.7 % — ABNORMAL LOW (ref 39.0–52.0)
Hemoglobin: 9 g/dL — ABNORMAL LOW (ref 13.0–17.0)
Immature Granulocytes: 0 %
Lymphocytes Relative: 13 %
Lymphs Abs: 0.8 10*3/uL (ref 0.7–4.0)
MCH: 24.8 pg — ABNORMAL LOW (ref 26.0–34.0)
MCHC: 30.3 g/dL (ref 30.0–36.0)
MCV: 81.8 fL (ref 80.0–100.0)
Monocytes Absolute: 0.5 10*3/uL (ref 0.1–1.0)
Monocytes Relative: 8 %
Neutro Abs: 4.8 10*3/uL (ref 1.7–7.7)
Neutrophils Relative %: 77 %
Platelets: 160 10*3/uL (ref 150–400)
RBC: 3.63 MIL/uL — ABNORMAL LOW (ref 4.22–5.81)
RDW: 15.6 % — ABNORMAL HIGH (ref 11.5–15.5)
WBC: 6.3 10*3/uL (ref 4.0–10.5)
nRBC: 0 % (ref 0.0–0.2)

## 2020-06-29 LAB — HEPATIC FUNCTION PANEL
ALT: 16 U/L (ref 0–44)
AST: 22 U/L (ref 15–41)
Albumin: 2.7 g/dL — ABNORMAL LOW (ref 3.5–5.0)
Alkaline Phosphatase: 36 U/L — ABNORMAL LOW (ref 38–126)
Bilirubin, Direct: 0.2 mg/dL (ref 0.0–0.2)
Indirect Bilirubin: 0.4 mg/dL (ref 0.3–0.9)
Total Bilirubin: 0.6 mg/dL (ref 0.3–1.2)
Total Protein: 5.2 g/dL — ABNORMAL LOW (ref 6.5–8.1)

## 2020-06-29 LAB — RESPIRATORY PANEL BY RT PCR (FLU A&B, COVID)
Influenza A by PCR: NEGATIVE
Influenza B by PCR: NEGATIVE
SARS Coronavirus 2 by RT PCR: NEGATIVE

## 2020-06-29 LAB — URINALYSIS, MICROSCOPIC (REFLEX)

## 2020-06-29 LAB — BASIC METABOLIC PANEL
Anion gap: 10 (ref 5–15)
BUN: 26 mg/dL — ABNORMAL HIGH (ref 8–23)
CO2: 26 mmol/L (ref 22–32)
Calcium: 9 mg/dL (ref 8.9–10.3)
Chloride: 105 mmol/L (ref 98–111)
Creatinine, Ser: 0.94 mg/dL (ref 0.61–1.24)
GFR, Estimated: >60 mL/min (ref >60)
Glucose, Bld: 88 mg/dL (ref 70–99)
Potassium: 4.7 mmol/L (ref 3.5–5.1)
Sodium: 141 mmol/L (ref 135–145)

## 2020-06-29 LAB — LACTIC ACID, PLASMA: Lactic Acid, Venous: 1.4 mmol/L (ref 0.5–1.9)

## 2020-06-29 LAB — LIPASE, BLOOD: Lipase: 22 U/L (ref 11–51)

## 2020-06-29 MED ORDER — DOXYCYCLINE HYCLATE 100 MG PO TABS
100.0000 mg | ORAL_TABLET | Freq: Once | ORAL | Status: AC
  Administered 2020-06-29: 100 mg via ORAL
  Filled 2020-06-29: qty 1

## 2020-06-29 MED ORDER — DOXYCYCLINE HYCLATE 100 MG PO CAPS: 100.0000 mg | ORAL_CAPSULE | Freq: Two times a day (BID) | ORAL | 0 refills | Status: DC

## 2020-06-29 NOTE — ED Triage Notes (Signed)
Pt BIB EMS from home. Home health called EMS today due to progressive generalized weakness that started last Thursday. Pt fell on Thursday, but he refused to go to the hospital at that time. Pt started running a fever today, which is why he agreed to come to hospital today. Pt has sacral wound, which is not new or worsened. Pt does have new pressure sores to right buttocks and hip. Pt has edema to extremities,whic is not any worse than last Tuesday. Pt has expiratory wheezing bilaterally. Pt reports worsening cough. A&O x4.   Temp 101 BP 122/64 HR 86 RR 20 98% RA CBG 135 20G RH

## 2020-06-29 NOTE — ED Notes (Signed)
Niece arrived to ER and stated she would take patient home.

## 2020-06-29 NOTE — ED Notes (Signed)
Per lab, there is a delay in COVID test results due to a technician currently working on the machine.

## 2020-06-29 NOTE — Discharge Instructions (Signed)
Possible pneumonia on chest x-ray but otherwise lab work is unremarkable. We will start antibiotic treatment just in case that this is an infectious process. However he did not have a fever here today. Continue to take antibiotic at home and follow-up with your primary care doctor.

## 2020-06-29 NOTE — ED Notes (Signed)
Pt unable to get up to get in wheelchair due to weakness. [facility] called.

## 2020-06-29 NOTE — ED Notes (Signed)
Delay in discharge due to needing results of COVID test per SW to determine if patient can go home via safe transport or [facility].
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
## 2020-06-30 ENCOUNTER — Ambulatory Visit: Payer: Medicare PPO

## 2020-06-30 DIAGNOSIS — J449 Chronic obstructive pulmonary disease, unspecified: Secondary | ICD-10-CM

## 2020-06-30 LAB — URINE CULTURE

## 2020-06-30 NOTE — Chronic Care Management (AMB) (Signed)
°  Care Management    Consult Note  06/30/2020 Name: STOKES RATTIGAN MRN: [id number] DOB: [birth year]  Care management team received notification of patient's recent emergency department visit related to Community Acquired Pneumonia. Based on review of health record, NESTA KIMPLE is currently active in the embedded care coordination program.   Review of patient status, including review of consultants reports, relevant laboratory and other test results, and collaboration with appropriate care team members and the patient's provider was performed as part of comprehensive patient evaluation and provision of chronic care management services.     Plan: Collaboration with RN Care Manager to inform of patient ED visit. SW will follow up with the patient over the next week.  [name], BSW, CDP Social Worker, Certified Dementia Practitioner [place] [facility] [phone]

## 2020-07-03 NOTE — Telephone Encounter (Signed)
Got an FYI call today that the pt is refusing therapy and they are discharging him.

## 2020-07-04 ENCOUNTER — Other Ambulatory Visit: Payer: Self-pay

## 2020-07-04 ENCOUNTER — Ambulatory Visit: Payer: Self-pay

## 2020-07-04 ENCOUNTER — Telehealth: Payer: Self-pay

## 2020-07-04 ENCOUNTER — Telehealth: Payer: Medicare PPO

## 2020-07-04 DIAGNOSIS — I10 Essential (primary) hypertension: Secondary | ICD-10-CM

## 2020-07-04 DIAGNOSIS — J449 Chronic obstructive pulmonary disease, unspecified: Secondary | ICD-10-CM

## 2020-07-04 DIAGNOSIS — N182 Chronic kidney disease, stage 2 (mild): Secondary | ICD-10-CM

## 2020-07-04 NOTE — Telephone Encounter (Cosign Needed)
  Chronic Care Management   Outreach Note  07/04/2020 Name: Geoffrey Frank MRN: [id number] DOB: 02/26/1926  Referred by: Glendale Chard, MD Reason for referral : Chronic Care Management (Inbound Call from patient )   An unsuccessful telephone outreach was attempted today. The patient was referred to the case management team for assistance with care management and care coordination.   Follow Up Plan: The care management team will reach out to the patient again over the next 7 days.   [name], RN, BSN, CCM Care Management Coordinator [place] [facility]  Direct Phone: [phone]

## 2020-07-04 NOTE — Telephone Encounter (Signed)
[name] social worker with [facility] called to request an additional visit and that it's recommended that APS go out to visit the pt because of some things that has been going on.

## 2020-07-05 ENCOUNTER — Ambulatory Visit: Payer: Medicare PPO

## 2020-07-05 ENCOUNTER — Inpatient Hospital Stay (HOSPITAL_COMMUNITY)
Admission: EM | Admit: 2020-07-05 | Discharge: 2020-07-10 | DRG: 193 | Disposition: A | Discharge status: Skilled Nursing Facility | Payer: Medicare PPO | Attending: Internal Medicine | Admitting: Internal Medicine

## 2020-07-05 ENCOUNTER — Other Ambulatory Visit: Payer: Self-pay

## 2020-07-05 ENCOUNTER — Encounter (HOSPITAL_COMMUNITY): Payer: Self-pay | Admitting: Internal Medicine

## 2020-07-05 ENCOUNTER — Emergency Department (HOSPITAL_COMMUNITY): Payer: Medicare PPO

## 2020-07-05 DIAGNOSIS — R001 Bradycardia, unspecified: Secondary | ICD-10-CM | POA: Diagnosis not present

## 2020-07-05 DIAGNOSIS — N182 Chronic kidney disease, stage 2 (mild): Secondary | ICD-10-CM | POA: Diagnosis present

## 2020-07-05 DIAGNOSIS — E1122 Type 2 diabetes mellitus with diabetic chronic kidney disease: Secondary | ICD-10-CM | POA: Diagnosis not present

## 2020-07-05 DIAGNOSIS — E860 Dehydration: Secondary | ICD-10-CM | POA: Diagnosis present

## 2020-07-05 DIAGNOSIS — R0602 Shortness of breath: Secondary | ICD-10-CM

## 2020-07-05 DIAGNOSIS — D696 Thrombocytopenia, unspecified: Secondary | ICD-10-CM | POA: Diagnosis present

## 2020-07-05 DIAGNOSIS — J440 Chronic obstructive pulmonary disease with acute lower respiratory infection: Secondary | ICD-10-CM | POA: Diagnosis present

## 2020-07-05 DIAGNOSIS — Z20822 Contact with and (suspected) exposure to covid-19: Secondary | ICD-10-CM | POA: Diagnosis not present

## 2020-07-05 DIAGNOSIS — J9811 Atelectasis: Secondary | ICD-10-CM | POA: Diagnosis not present

## 2020-07-05 DIAGNOSIS — Z66 Do not resuscitate: Secondary | ICD-10-CM | POA: Diagnosis present

## 2020-07-05 DIAGNOSIS — C679 Malignant neoplasm of bladder, unspecified: Secondary | ICD-10-CM | POA: Diagnosis present

## 2020-07-05 DIAGNOSIS — I5032 Chronic diastolic (congestive) heart failure: Secondary | ICD-10-CM | POA: Diagnosis not present

## 2020-07-05 DIAGNOSIS — F0390 Unspecified dementia without behavioral disturbance: Secondary | ICD-10-CM | POA: Diagnosis present

## 2020-07-05 DIAGNOSIS — Z87891 Personal history of nicotine dependence: Secondary | ICD-10-CM

## 2020-07-05 DIAGNOSIS — Z841 Family history of disorders of kidney and ureter: Secondary | ICD-10-CM

## 2020-07-05 DIAGNOSIS — Z9119 Patient's noncompliance with other medical treatment and regimen: Secondary | ICD-10-CM

## 2020-07-05 DIAGNOSIS — D631 Anemia in chronic kidney disease: Secondary | ICD-10-CM | POA: Diagnosis present

## 2020-07-05 DIAGNOSIS — Z8249 Family history of ischemic heart disease and other diseases of the circulatory system: Secondary | ICD-10-CM

## 2020-07-05 DIAGNOSIS — J449 Chronic obstructive pulmonary disease, unspecified: Secondary | ICD-10-CM

## 2020-07-05 DIAGNOSIS — I5042 Chronic combined systolic (congestive) and diastolic (congestive) heart failure: Secondary | ICD-10-CM | POA: Diagnosis not present

## 2020-07-05 DIAGNOSIS — F05 Delirium due to known physiological condition: Secondary | ICD-10-CM | POA: Diagnosis not present

## 2020-07-05 DIAGNOSIS — J90 Pleural effusion, not elsewhere classified: Secondary | ICD-10-CM | POA: Diagnosis not present

## 2020-07-05 DIAGNOSIS — L89212 Pressure ulcer of right hip, stage 2: Secondary | ICD-10-CM | POA: Diagnosis present

## 2020-07-05 DIAGNOSIS — R296 Repeated falls: Secondary | ICD-10-CM | POA: Diagnosis present

## 2020-07-05 DIAGNOSIS — R4182 Altered mental status, unspecified: Secondary | ICD-10-CM | POA: Diagnosis present

## 2020-07-05 DIAGNOSIS — E875 Hyperkalemia: Secondary | ICD-10-CM | POA: Diagnosis present

## 2020-07-05 DIAGNOSIS — I872 Venous insufficiency (chronic) (peripheral): Secondary | ICD-10-CM | POA: Diagnosis not present

## 2020-07-05 DIAGNOSIS — L89152 Pressure ulcer of sacral region, stage 2: Secondary | ICD-10-CM | POA: Diagnosis not present

## 2020-07-05 DIAGNOSIS — C61 Malignant neoplasm of prostate: Secondary | ICD-10-CM | POA: Diagnosis present

## 2020-07-05 DIAGNOSIS — Z87442 Personal history of urinary calculi: Secondary | ICD-10-CM

## 2020-07-05 DIAGNOSIS — Z9114 Patient's other noncompliance with medication regimen: Secondary | ICD-10-CM

## 2020-07-05 DIAGNOSIS — E440 Moderate protein-calorie malnutrition: Secondary | ICD-10-CM | POA: Insufficient documentation

## 2020-07-05 DIAGNOSIS — N3000 Acute cystitis without hematuria: Secondary | ICD-10-CM | POA: Diagnosis not present

## 2020-07-05 DIAGNOSIS — I130 Hypertensive heart and chronic kidney disease with heart failure and stage 1 through stage 4 chronic kidney disease, or unspecified chronic kidney disease: Secondary | ICD-10-CM | POA: Diagnosis not present

## 2020-07-05 DIAGNOSIS — Z7401 Bed confinement status: Secondary | ICD-10-CM | POA: Diagnosis not present

## 2020-07-05 DIAGNOSIS — Z7982 Long term (current) use of aspirin: Secondary | ICD-10-CM

## 2020-07-05 DIAGNOSIS — Z8042 Family history of malignant neoplasm of prostate: Secondary | ICD-10-CM

## 2020-07-05 DIAGNOSIS — R000 Tachycardia, unspecified: Secondary | ICD-10-CM | POA: Diagnosis not present

## 2020-07-05 DIAGNOSIS — G9341 Metabolic encephalopathy: Secondary | ICD-10-CM | POA: Diagnosis not present

## 2020-07-05 DIAGNOSIS — E1151 Type 2 diabetes mellitus with diabetic peripheral angiopathy without gangrene: Secondary | ICD-10-CM | POA: Diagnosis present

## 2020-07-05 DIAGNOSIS — Z8546 Personal history of malignant neoplasm of prostate: Secondary | ICD-10-CM | POA: Diagnosis not present

## 2020-07-05 DIAGNOSIS — R0902 Hypoxemia: Secondary | ICD-10-CM | POA: Diagnosis not present

## 2020-07-05 DIAGNOSIS — I70203 Unspecified atherosclerosis of native arteries of extremities, bilateral legs: Secondary | ICD-10-CM | POA: Diagnosis not present

## 2020-07-05 DIAGNOSIS — I10 Essential (primary) hypertension: Secondary | ICD-10-CM | POA: Diagnosis not present

## 2020-07-05 DIAGNOSIS — M2550 Pain in unspecified joint: Secondary | ICD-10-CM | POA: Diagnosis not present

## 2020-07-05 DIAGNOSIS — R609 Edema, unspecified: Secondary | ICD-10-CM | POA: Diagnosis not present

## 2020-07-05 DIAGNOSIS — J189 Pneumonia, unspecified organism: Principal | ICD-10-CM | POA: Diagnosis present

## 2020-07-05 DIAGNOSIS — N179 Acute kidney failure, unspecified: Secondary | ICD-10-CM | POA: Diagnosis present

## 2020-07-05 DIAGNOSIS — Z6822 Body mass index (BMI) 22.0-22.9, adult: Secondary | ICD-10-CM

## 2020-07-05 DIAGNOSIS — Z86010 Personal history of colonic polyps: Secondary | ICD-10-CM

## 2020-07-05 DIAGNOSIS — Z79899 Other long term (current) drug therapy: Secondary | ICD-10-CM

## 2020-07-05 LAB — COMPREHENSIVE METABOLIC PANEL
ALT: 16 U/L (ref 0–44)
AST: 30 U/L (ref 15–41)
Albumin: 2.7 g/dL — ABNORMAL LOW (ref 3.5–5.0)
Alkaline Phosphatase: 53 U/L (ref 38–126)
Anion gap: 10 (ref 5–15)
BUN: 32 mg/dL — ABNORMAL HIGH (ref 8–23)
CO2: 26 mmol/L (ref 22–32)
Calcium: 9.1 mg/dL (ref 8.9–10.3)
Chloride: 102 mmol/L (ref 98–111)
Creatinine, Ser: 1.49 mg/dL — ABNORMAL HIGH (ref 0.61–1.24)
GFR, Estimated: 44 mL/min — ABNORMAL LOW (ref >60)
Glucose, Bld: 97 mg/dL (ref 70–99)
Potassium: 4.8 mmol/L (ref 3.5–5.1)
Sodium: 138 mmol/L (ref 135–145)
Total Bilirubin: 1.3 mg/dL — ABNORMAL HIGH (ref 0.3–1.2)
Total Protein: 5.3 g/dL — ABNORMAL LOW (ref 6.5–8.1)

## 2020-07-05 LAB — CBC WITH DIFFERENTIAL/PLATELET
Abs Immature Granulocytes: 0.02 10*3/uL (ref 0.00–0.07)
Basophils Absolute: 0 10*3/uL (ref 0.0–0.1)
Basophils Relative: 0 %
Eosinophils Absolute: 0 10*3/uL (ref 0.0–0.5)
Eosinophils Relative: 0 %
HCT: 31.7 % — ABNORMAL LOW (ref 39.0–52.0)
Hemoglobin: 9.6 g/dL — ABNORMAL LOW (ref 13.0–17.0)
Immature Granulocytes: 0 %
Lymphocytes Relative: 14 %
Lymphs Abs: 1 10*3/uL (ref 0.7–4.0)
MCH: 24.3 pg — ABNORMAL LOW (ref 26.0–34.0)
MCHC: 30.3 g/dL (ref 30.0–36.0)
MCV: 80.3 fL (ref 80.0–100.0)
Monocytes Absolute: 0.7 10*3/uL (ref 0.1–1.0)
Monocytes Relative: 10 %
Neutro Abs: 5.1 10*3/uL (ref 1.7–7.7)
Neutrophils Relative %: 76 %
Platelets: 159 10*3/uL (ref 150–400)
RBC: 3.95 MIL/uL — ABNORMAL LOW (ref 4.22–5.81)
RDW: 14.8 % (ref 11.5–15.5)
WBC: 6.7 10*3/uL (ref 4.0–10.5)
nRBC: 0 % (ref 0.0–0.2)

## 2020-07-05 LAB — URINALYSIS, ROUTINE W REFLEX MICROSCOPIC
Bilirubin Urine: NEGATIVE
Glucose, UA: NEGATIVE mg/dL
Ketones, ur: NEGATIVE mg/dL
Nitrite: NEGATIVE
Protein, ur: 100 mg/dL — AB
Specific Gravity, Urine: 1.017 (ref 1.005–1.030)
WBC, UA: >50 WBC/hpf — ABNORMAL HIGH (ref 0–5)
pH: 5 (ref 5.0–8.0)

## 2020-07-05 LAB — RESP PANEL BY RT PCR (RSV, FLU A&B, COVID)
Influenza A by PCR: NEGATIVE
Influenza B by PCR: NEGATIVE
Respiratory Syncytial Virus by PCR: NEGATIVE
SARS Coronavirus 2 by RT PCR: NEGATIVE

## 2020-07-05 LAB — HIV ANTIBODY (ROUTINE TESTING W REFLEX): HIV Screen 4th Generation wRfx: NONREACTIVE

## 2020-07-05 LAB — BRAIN NATRIURETIC PEPTIDE: B Natriuretic Peptide: 315.5 pg/mL — ABNORMAL HIGH (ref 0.0–100.0)

## 2020-07-05 LAB — LACTIC ACID, PLASMA
Lactic Acid, Venous: 2 mmol/L (ref 0.5–1.9)
Lactic Acid, Venous: 2.1 mmol/L (ref 0.5–1.9)

## 2020-07-05 LAB — STREP PNEUMONIAE URINARY ANTIGEN: Strep Pneumo Urinary Antigen: NEGATIVE

## 2020-07-05 MED ORDER — FUROSEMIDE 10 MG/ML IJ SOLN
40.0000 mg | Freq: Once | INTRAMUSCULAR | Status: AC
  Administered 2020-07-05: 40 mg via INTRAVENOUS
  Filled 2020-07-05: qty 4

## 2020-07-05 MED ORDER — DOCUSATE SODIUM 100 MG PO CAPS: 100.0000 mg | ORAL_CAPSULE | Freq: Every day | ORAL | Status: DC | PRN

## 2020-07-05 MED ORDER — ASPIRIN EC 81 MG PO TBEC
81.0000 mg | DELAYED_RELEASE_TABLET | Freq: Every day | ORAL | Status: DC
  Administered 2020-07-06 – 2020-07-10 (×5): 81 mg via ORAL
  Filled 2020-07-05 (×5): qty 1

## 2020-07-05 MED ORDER — ENOXAPARIN SODIUM 40 MG/0.4ML ~~LOC~~ SOLN
40.0000 mg | SUBCUTANEOUS | Status: DC
  Administered 2020-07-05 – 2020-07-09 (×4): 40 mg via SUBCUTANEOUS
  Filled 2020-07-05 (×5): qty 0.4

## 2020-07-05 MED ORDER — METOPROLOL TARTRATE 25 MG PO TABS
25.0000 mg | ORAL_TABLET | Freq: Two times a day (BID) | ORAL | Status: DC
  Administered 2020-07-05 – 2020-07-10 (×10): 25 mg via ORAL
  Filled 2020-07-05 (×11): qty 1

## 2020-07-05 MED ORDER — SODIUM CHLORIDE 0.9 % IV SOLN
500.0000 mg | INTRAVENOUS | Status: DC
  Administered 2020-07-06: 500 mg via INTRAVENOUS
  Filled 2020-07-05: qty 500

## 2020-07-05 MED ORDER — SODIUM CHLORIDE 0.9 % IV SOLN
1.0000 g | INTRAVENOUS | Status: DC
  Administered 2020-07-06 – 2020-07-08 (×3): 1 g via INTRAVENOUS
  Filled 2020-07-05 (×3): qty 10

## 2020-07-05 MED ORDER — SODIUM CHLORIDE 0.9 % IV SOLN
1.0000 g | Freq: Once | INTRAVENOUS | Status: AC
  Administered 2020-07-05: 1 g via INTRAVENOUS
  Filled 2020-07-05: qty 10

## 2020-07-05 MED ORDER — AMLODIPINE BESYLATE 5 MG PO TABS
5.0000 mg | ORAL_TABLET | Freq: Every day | ORAL | Status: DC
  Administered 2020-07-06 – 2020-07-10 (×5): 5 mg via ORAL
  Filled 2020-07-05 (×5): qty 1

## 2020-07-05 MED ORDER — POLYETHYLENE GLYCOL 3350 17 GM/SCOOP PO POWD
17.0000 g | Freq: Every day | ORAL | Status: DC | PRN
  Filled 2020-07-05: qty 255

## 2020-07-05 MED ORDER — SODIUM CHLORIDE 0.9 % IV SOLN
500.0000 mg | Freq: Once | INTRAVENOUS | Status: AC
  Administered 2020-07-05: 500 mg via INTRAVENOUS
  Filled 2020-07-05: qty 500

## 2020-07-05 MED ORDER — SODIUM CHLORIDE 0.9 % IV SOLN: INTRAVENOUS | Status: AC

## 2020-07-05 MED ORDER — SODIUM CHLORIDE 0.9 % IV BOLUS
500.0000 mL | Freq: Once | INTRAVENOUS | Status: AC
  Administered 2020-07-05: 500 mL via INTRAVENOUS

## 2020-07-05 NOTE — ED Notes (Addendum)
Patient in and out cath 74ml of amber urine obtained. Assisted by Otila Kluver NT

## 2020-07-05 NOTE — Chronic Care Management (AMB) (Addendum)
  Chronic Care Management   Outreach Note  07/05/2020 Name: Geoffrey Frank MRN: [id number] DOB: 1926-01-13  Referred by: Glendale Chard, MD Reason for referral : Care Coordination   SW received inbound call from [place] Worker [name] [phone]) who expresses concern over patient ability to remain in the home. [name] reports family member concern for patient safety due to patients continued physical decline. The patient lives alone and has family who is assisting as much as they can with care needs but no one is available to assist full-time. [name] indicated previous plan to visit the patient home on 07/05/20 with Home Health RN to complete a home visit and place an APS referral. Unfortunately, upon arrival the patient oxygen level was low and EMS was called. SW advised [name] of this SW role and plan to follow up with hospital liaison regarding patient placement needs.  SW successfully outreached the patients grand-daughter, [name], in response to a voice message received requesting a return call. Mrs. Maxwell reports concern over patient ability to remain in the home and desire for "emergency placement". SW discussed process involved for placement as well as funding needed including opportunity to apply for assistance from Medicaid once the patient completes a spend down. SW also discussed barrier to placement if the patient is not willing due to patient cognition.  Mrs. Maxwell further reports concern over patient worsening depression indicating the patient often has crying spells over lost independence. The patient has been making statements such as "I am ready to die" and "I won't be alive at Thanksgiving". SW briefly discussed opportunity to involve Hospice for end of life care and support to the patient and family. Mrs. Maxwell did not engage in conversation about Hospice.  SW advised Mrs. Maxwell this SW plans to outreach hospital liaison to request SW  and/or Case Management support for the patient.  Follow Up Plan: Collaboration with embedded [place] regarding above. SW to continue to follow to assist with care coordination needs post discharge.  1:33 pm: SW received inbound call from [facility] ED PA, Emeterio Reeve to review patient placement needs due to limited assistance in the home. PA reports she is still assessing patient to determine if appropriate for admission.   [name], BSW, CDP Social Worker, Certified Dementia Practitioner [place] [facility] [place] [facility] [phone]
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
## 2020-07-05 NOTE — Patient Instructions (Addendum)
Visit Information  Goals Addressed      Patient Stated   .  "I need help at home but don't think I can qualify" (pt-stated)        Current Barriers:  [name] Knowledge Deficits related to eligibility for VA benefits to assist with caregiver/custodial care . Chronic Disease Management support and education needs related to CKD, HTN, COPD, Impaired Physical Mobility and Poor Gait . Lacks caregiver support.  . Self Care Deficit  Nurse Case Manager Clinical Goal(s):  [name] Over the next 30 days, patient will work with the [place] office to address needs related to [place] benefits including caregiver and or custodial care assistance  Goal Not Met  . New - 08/23/19 - Over the next 90 days, patient will maintain his ability to perform Self Care as evidence by patient will be able to continue completing his ADL's/IADL's, self-administering his medications and independently performing all other aspects of personal care without difficulty  CCM RN CM Interventions:  10/08/19 call completed with patient   . Evaluation of current treatment plan related to verification of VA benefits and patient's adherence to plan as established by provider . Determined patient has not contacted the [place] office; determined patient's granddaughter continues to be supportive and visits him often . Determined patient does not plan to follow up with the [place] Office at this time; discussed patient is content with his current living situation although he admits he still gets lonely living alone . Encouraged patient to reconsider contacting the [place] office; to help determine what benefits Mr. Zaremba may be eligible for and will help provide him with the details/application needed in order to apply for benefits . Discussed plans with patient for ongoing care management follow up and provided patient with direct contact information for care management  team  Patient Self Care Activities:  . Self administers medications as prescribed . Attends all scheduled provider appointments . Calls pharmacy for medication refills . Performs ADL's independently . Performs IADL's independently . Calls provider office for new concerns or questions  Please see past updates related to this goal by clicking on the "Past Updates" button in the selected goal      .  "I need to know when my hospital bed will arrive" (pt-stated)        [place] (see longitudinal plan of care for additional care plan information)  Current Barriers:  . Care Coordination needs related to DME in a patient with new onset of Community Acquired-Pneumonia . 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 Lacks caregiver support.   Nurse Case Manager Clinical Goal(s):  [name] Over the next 30 days, patient will work with health care team  to address needs related to Care Coordination for DME needs  CCM RN CM Interventions:  07/04/20 inbound call received from patient  . Inter-disciplinary care team collaboration (see longitudinal plan of care) . Returned call from patient after receiving a voice message stating he needs to know when his hospital bed will arrive to his home . Determined patient has since learned his semi-electric hospital bed will arrive to his home on Wednesday this week, per the [facility] CM . Determined patient has no other DME needs at this time, his son is present and is available to assist him with care needs . Determined patient's son Nicki Reaper not listed on DPR has concerns about increased bruising  to patient's arms, he is requesting a nurse visit, no PHI shared . Collaborated with [facility] spoke with Ebony Hail  regarding confirmation that home services have resumed, Renaldo Reel is currently assigned, Harriette Bouillon will take over his care in about a week, advised patient's son is requesting a nurse visit, Ebony Hail will notify  Engineer, building services  . Collaboration with embedded BSW Kendra Humble regarding patient's status and reported son's concerns   . Discussed plans with patient for ongoing care management follow up and provided patient with direct contact information for care management team  Patient Self Care Activities:  . Self administers medications as prescribed . Attends all scheduled provider appointments . Calls pharmacy for medication refills . Calls provider office for new concerns or questions  Initial goal documentation       Patient verbalizes understanding of instructions provided today.   Telephone follow up appointment with care management team member scheduled for:   [name], RN, BSN, CCM Care Management Coordinator [facility]  Direct Phone: [phone]
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
## 2020-07-05 NOTE — ED Notes (Signed)
Attempted to call report to 3E x 1

## 2020-07-05 NOTE — ED Notes (Signed)
Attempted to walk patient, patient up able to stand. PA made aware

## 2020-07-05 NOTE — Chronic Care Management (AMB) (Signed)
Chronic Care Management   Follow Up Note   07/04/2020 Name: Geoffrey Frank MRN: [id number] DOB: 1926-06-03  Referred by: Geoffrey Chard, MD Reason for referral : Chronic Care Management (CCM RNCM FU Call )   Geoffrey Frank is a 86 y.o. year old male who is a primary care patient of Geoffrey Chard, MD. The CCM team was consulted for assistance with chronic disease management and care coordination needs.    Review of patient status, including review of consultants reports, relevant laboratory and other test results, and collaboration with appropriate care team members and the patient's provider was performed as part of comprehensive patient evaluation and provision of chronic care management services.    SDOH (Social Determinants of Health) assessments performed: No See Care Plan activities for detailed interventions related to [place])   Received inbound call from patient concerning his hospital bed.     Outpatient Encounter Medications as of 07/04/2020  Medication Sig  . acetaminophen (TYLENOL) 325 MG tablet Take 2 tablets (650 mg total) by mouth every 6 (six) hours as needed for mild pain (or Fever >/= 101).  [name] amLODipine-olmesartan (AZOR) 10-40 MG tablet Take 0.5 tablets by mouth daily.  [name] aspirin 81 MG tablet Take 81 mg by mouth daily.  [name] docusate sodium (COLACE) 100 MG capsule Take 1 capsule (100 mg total) by mouth daily as needed for mild constipation.  [name] doxycycline (VIBRAMYCIN) 100 MG capsule Take 1 capsule (100 mg total) by mouth 2 (two) times daily for 10 days.  . Furosemide (LASIX PO) Take by mouth. unk dose but restarted furosemide (unk dose) 06/21/20  . lidocaine (XYLOCAINE) 5 % ointment Apply 1 application topically as needed.  . linaclotide (LINZESS) 145 MCG CAPS capsule Take 1 capsule (145 mcg total) by mouth daily before breakfast. (Patient taking differently: Take 145 mcg by mouth every Monday, Wednesday, and Friday. )  . metoprolol tartrate (LOPRESSOR) 25 MG tablet  Take 1 tablet (25 mg total) by mouth 2 (two) times daily.  . polyethylene glycol powder (MIRALAX) 17 GM/SCOOP powder Take 17 g by mouth daily as needed for moderate constipation or severe constipation.  . QUEtiapine (SEROQUEL) 25 MG tablet Take 0.5 tablets (12.5 mg total) by mouth at bedtime. (Patient not taking: Reported on 06/20/2020)   No facility-administered encounter medications on file as of 07/04/2020.     Objective:  Lab Results  Component Value Date   HGBA1C 5.9 (H) 06/25/2018   HGBA1C 5.9 (H) 09/29/2017   HGBA1C 5.9 (H) 10/22/2016   Lab Results  Component Value Date   CREATININE 0.94 06/29/2020   BP Readings from Last 3 Encounters:  06/29/20 139/89  06/20/20 112/80  06/09/20 135/90    Goals Addressed      Patient Stated   .  "I need help at home but don't think I can qualify" (pt-stated)        Current Barriers:  [name] Knowledge Deficits related to eligibility for VA benefits to assist with caregiver/custodial care . Chronic Disease Management support and education needs related to CKD, HTN, COPD, Impaired Physical Mobility and Poor Gait . Lacks caregiver support.  . Self Care Deficit  Nurse Case Manager Clinical Goal(s):  [name] Over the next 30 days, patient will work with the [place] office to address needs related to [place] benefits including caregiver and or custodial care assistance  Goal Not Met  . New - 08/23/19 - Over the next 90 days, patient will maintain his ability to perform Self  Care as evidence by patient will be able to continue completing his ADL's/IADL's, self-administering his medications and independently performing all other aspects of personal care without difficulty  CCM RN CM Interventions:  10/08/19 call completed with patient   . Evaluation of current treatment plan related to verification of VA benefits and patient's adherence to plan as established by provider . Determined patient has not contacted the [place] office; determined patient's granddaughter continues to be supportive and visits him often . Determined patient does not plan to follow up with the [place] Office at this time; discussed patient is content with his current living situation although he admits he still gets lonely living alone . Encouraged patient to reconsider contacting the [place] office; to help determine what benefits [name] may be eligible for and will help provide him with the details/application needed in order to apply for benefits . Discussed plans with patient for ongoing care management follow up and provided patient with direct contact information for care management team  Patient Self Care Activities:  . Self administers medications as prescribed . Attends all scheduled provider appointments . Calls pharmacy for medication refills . Performs ADL's independently . Performs IADL's independently . Calls provider office for new concerns or questions  Please see past updates related to this goal by clicking on the "Past Updates" button in the selected goal      .  "I need to know when my hospital bed will arrive" (pt-stated)        [place] (see longitudinal plan of care for additional care plan information)  Current Barriers:  . Care Coordination needs related to DME in a patient with new onset of Community Acquired-Pneumonia . 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 Lacks caregiver support.   Nurse Case Manager Clinical Goal(s):  [name] Over the next 30 days, patient will work with health care team  to address needs related to Care Coordination for DME needs  CCM RN CM Interventions:  07/04/20 inbound call received from patient  . Inter-disciplinary care team collaboration (see longitudinal plan of care) . Returned call from patient after receiving a voice message stating he needs to know when his hospital bed will  arrive to his home . Determined patient has since learned his semi-electric hospital bed will arrive to his home on Wednesday this week, per the [facility] CM . Determined patient has no other DME needs at this time, his son is present and is available to assist him with care needs . Determined patient's son Nicki Reaper not listed on DPR has concerns about increased bruising to patient's arms, he is requesting a nurse visit, no PHI shared . Collaborated with [facility] spoke with Ebony Hail  regarding confirmation that home services have resumed, Renaldo Reel is currently assigned, Harriette Bouillon will take over his care in about a week, advised patient's son is requesting a nurse visit, Ebony Hail will notify Engineer, building services  . Collaboration with embedded BSW Kendra Humble regarding patient's status and reported son's concerns   . Discussed plans with patient for ongoing care management follow up and provided patient with direct contact information for care management team  Patient Self Care Activities:  . Self administers medications as prescribed . Attends all scheduled provider appointments . Calls pharmacy for medication refills . Calls provider office for new concerns or questions  Initial goal documentation  Plan:   Telephone follow up appointment with care management team member scheduled for:   [name], RN, BSN, CCM Care Management Coordinator [place] [facility]  Direct Phone: [phone]

## 2020-07-05 NOTE — ED Triage Notes (Signed)
Patient arrived via [facility] from home. Home health nurse stated patient was found to have O2 sats in 70s and was placed on 4L via [place] with no improved. When EMS got there patient fingers were cold and O2 was not taking. Patient was placed on 15L NRB and O2 sats 97%. Patient was diagnosed with PNE 1 week ago. Patient denies any CP, N/V/D. EMS stated patient has not been taking his Lasix. 12 Lead ok. Patient has a Stage 2 Ulcer to buttock.

## 2020-07-06 LAB — CBC WITH DIFFERENTIAL/PLATELET
Abs Immature Granulocytes: 0.04 10*3/uL (ref 0.00–0.07)
Basophils Absolute: 0 10*3/uL (ref 0.0–0.1)
Basophils Relative: 0 %
Eosinophils Absolute: 0 10*3/uL (ref 0.0–0.5)
Eosinophils Relative: 0 %
HCT: 33.6 % — ABNORMAL LOW (ref 39.0–52.0)
Hemoglobin: 10.2 g/dL — ABNORMAL LOW (ref 13.0–17.0)
Immature Granulocytes: 1 %
Lymphocytes Relative: 8 %
Lymphs Abs: 0.6 10*3/uL — ABNORMAL LOW (ref 0.7–4.0)
MCH: 23.8 pg — ABNORMAL LOW (ref 26.0–34.0)
MCHC: 30.4 g/dL (ref 30.0–36.0)
MCV: 78.3 fL — ABNORMAL LOW (ref 80.0–100.0)
Monocytes Absolute: 0.4 10*3/uL (ref 0.1–1.0)
Monocytes Relative: 5 %
Neutro Abs: 6.6 10*3/uL (ref 1.7–7.7)
Neutrophils Relative %: 86 %
Platelets: 161 10*3/uL (ref 150–400)
RBC: 4.29 MIL/uL (ref 4.22–5.81)
RDW: 14.8 % (ref 11.5–15.5)
WBC: 7.7 10*3/uL (ref 4.0–10.5)
nRBC: 0.3 % — ABNORMAL HIGH (ref 0.0–0.2)

## 2020-07-06 LAB — BASIC METABOLIC PANEL
Anion gap: 15 (ref 5–15)
BUN: 39 mg/dL — ABNORMAL HIGH (ref 8–23)
CO2: 22 mmol/L (ref 22–32)
Calcium: 9.3 mg/dL (ref 8.9–10.3)
Chloride: 103 mmol/L (ref 98–111)
Creatinine, Ser: 1.6 mg/dL — ABNORMAL HIGH (ref 0.61–1.24)
GFR, Estimated: 40 mL/min — ABNORMAL LOW (ref >60)
Glucose, Bld: 130 mg/dL — ABNORMAL HIGH (ref 70–99)
Potassium: 4.8 mmol/L (ref 3.5–5.1)
Sodium: 140 mmol/L (ref 135–145)

## 2020-07-06 LAB — MAGNESIUM: Magnesium: 2 mg/dL (ref 1.7–2.4)

## 2020-07-06 LAB — URINE CULTURE: Culture: <10000 — AB

## 2020-07-06 LAB — PROCALCITONIN: Procalcitonin: 0.77 ng/mL

## 2020-07-06 MED ORDER — ENSURE ENLIVE PO LIQD
237.0000 mL | Freq: Two times a day (BID) | ORAL | Status: DC
  Administered 2020-07-07 – 2020-07-10 (×8): 237 mL via ORAL

## 2020-07-06 MED ORDER — SODIUM CHLORIDE 0.9 % IV BOLUS
1000.0000 mL | Freq: Once | INTRAVENOUS | Status: AC
  Administered 2020-07-06: 1000 mL via INTRAVENOUS

## 2020-07-06 MED ORDER — ADULT MULTIVITAMIN W/MINERALS CH
1.0000 | ORAL_TABLET | Freq: Every day | ORAL | Status: DC
  Administered 2020-07-06 – 2020-07-10 (×5): 1 via ORAL
  Filled 2020-07-06 (×5): qty 1

## 2020-07-06 NOTE — Evaluation (Signed)
Occupational Therapy Evaluation Patient Details Name: Geoffrey Frank MRN: [id number] DOB: February 29, 1926 Today's Date: 07/06/2020    History of Present Illness 86 y.o. male with medical history significant of prostate/bladder CA; PVD; DM; and COPD presenting with hypoxia to 95s by home health nurse.  He was last seen on 10/21 with weakness and possible fever, CXR with ?infiltrate.  Increased confusion per family.   Clinical Impression   Patient admitted for the above diagnosis.  He presents with generalized weakness and confusion, poor stand balance, pain to his right side, decreased activity tolerance and declines to memory and orientation.  All of which currently impact his ADL and mobility status in the acute setting compared to his stated prior level.  He admits to being sedentary at times, but stated he could mobilize in his apartment.  Questionable historian given cognitive dysfunction.  He would like to go home, but OT will recommend SNF and 24 hour assist.  Recommendations can change if his status improves.  OT will continue to follow in the acute setting.      Follow Up Recommendations  SNF;Supervision/Assistance - 24 hour    Equipment Recommendations  Wheelchair cushion (measurements OT);Wheelchair (measurements OT)    Recommendations for Other Services       Precautions / Restrictions Precautions Precautions: Fall Restrictions Weight Bearing Restrictions: No Other Position/Activity Restrictions: Sacral wound      Mobility Bed Mobility               General bed mobility comments: Patient in recliner upon entering    Transfers Overall transfer level: Needs assistance   Transfers: Sit to/from Stand;Squat Pivot Transfers Sit to Stand: Max assist;+2 safety/equipment   Squat pivot transfers: Max assist;+2 safety/equipment          Balance Overall balance assessment: Needs assistance Sitting-balance support: No upper extremity supported Sitting balance-Leahy  Scale: Fair   Postural control: Right lateral lean;Posterior lean Standing balance support: Bilateral upper extremity supported Standing balance-Leahy Scale: Zero                             ADL either performed or assessed with clinical judgement   ADL Overall ADL's : Needs assistance/impaired Eating/Feeding: Set up;Sitting   Grooming: Wash/dry hands;Wash/dry face;Set up;Sitting   Upper Body Bathing: Minimal assistance;Sitting   Lower Body Bathing: Maximal assistance;Sitting/lateral leans   Upper Body Dressing : Minimal assistance;Sitting   Lower Body Dressing: Maximal assistance;Sitting/lateral leans   Toilet Transfer: Maximal assistance;+2 for safety/equipment   Toileting- Clothing Manipulation and Hygiene: Total assistance       Functional mobility during ADLs: Maximal assistance;+2 for safety/equipment General ADL Comments: Patient did not come to a full stand.  Could not balance on feet.  Did not trust using a walker.     Vision Baseline Vision/History: Wears glasses Wears Glasses: At all times Patient Visual Report: No change from baseline       Perception     Praxis      Pertinent Vitals/Pain Pain Assessment: Faces Faces Pain Scale: Hurts a little bit Pain Location: Right leg     Hand Dominance Right   Extremity/Trunk Assessment Upper Extremity Assessment Upper Extremity Assessment: Generalized weakness       Cervical / Trunk Assessment Cervical / Trunk Assessment: Kyphotic   Communication Communication Communication: No difficulties   Cognition Arousal/Alertness: Awake/alert Behavior During Therapy: Anxious Overall Cognitive Status: Impaired/Different from baseline Area of Impairment: Memory;Safety/judgement;Orientation  Orientation Level: Time;Situation   Memory: Decreased short-term memory   Safety/Judgement: Decreased awareness of deficits         General Comments      Exercises     Shoulder  Instructions      Home Living Family/patient expects to be discharged to:: Private residence Living Arrangements: Alone Available Help at Discharge: Family Type of Home: Apartment Home Access: Level entry     Home Layout: One level     Bathroom Shower/Tub: Teacher, early years/pre: Standard     Home Equipment: Environmental consultant - 2 wheels;Walker - 4 wheels;Shower seat;Bedside commode;Hospital bed;Grab bars - tub/shower;Adaptive equipment;Tub bench Adaptive Equipment: Reacher;Long-handled shoe horn Additional Comments: Reacher and long shoe horn. Pt sleep in a regular bed.      Prior Functioning/Environment Level of Independence: Needs assistance  Gait / Transfers Assistance Needed: Pt states he used RW at home.  STayed inbed alot  ADL's / Homemaking Assistance Needed: Assist with showering, dresed self, feeds self,  pt cooked per pt           OT Problem List: Decreased strength;Decreased activity tolerance;Impaired balance (sitting and/or standing);Decreased cognition;Decreased safety awareness;Pain;Increased edema      OT Treatment/Interventions: Self-care/ADL training;Therapeutic exercise;Therapeutic activities;Balance training    OT Goals(Current goals can be found in the care plan section) Acute Rehab OT Goals Patient Stated Goal: I guess I just need to move more. OT Goal Formulation: With patient Time For Goal Achievement: 07/20/20 Potential to Achieve Goals: Fair ADL Goals Pt Will Perform Grooming: with set-up;sitting Pt Will Perform Lower Body Bathing: with min assist;sitting/lateral leans Pt Will Perform Lower Body Dressing: with min assist;sitting/lateral leans Pt Will Transfer to Toilet: with min assist;squat pivot transfer;bedside commode Pt Will Perform Toileting - Clothing Manipulation and hygiene: with min assist;sitting/lateral leans  OT Frequency: Min 2X/week   Barriers to D/C: Decreased caregiver support          Co-evaluation               AM-PAC OT "6 Clicks" Daily Activity     Outcome Measure Help from another person eating meals?: A Little Help from another person taking care of personal grooming?: A Little Help from another person toileting, which includes using toliet, bedpan, or urinal?: Total Help from another person bathing (including washing, rinsing, drying)?: A Lot Help from another person to put on and taking off regular upper body clothing?: A Little Help from another person to put on and taking off regular lower body clothing?: A Lot 6 Click Score: 14   End of Session Nurse Communication: Other (comment) (IV beeping)  Activity Tolerance: No increased pain Patient left: in chair;with call bell/phone within reach;with chair alarm set  OT Visit Diagnosis: Unsteadiness on feet (R26.81);Muscle weakness (generalized) (M62.81);History of falling (Z91.81);Other symptoms and signs involving cognitive function                Time: 0712-1975 OT Time Calculation (min): 19 min Charges:  OT General Charges $OT Visit: 1 Visit OT Evaluation $OT Eval Moderate Complexity: 1 Mod  07/06/2020  Rich, OTR/L  Acute Rehabilitation Services  Office:  [phone]   Metta Clines 07/06/2020, 10:53 AM

## 2020-07-06 NOTE — Evaluation (Addendum)
Physical Therapy Evaluation Patient Details Name: Geoffrey Frank MRN: [id number] DOB: 1926/09/09 Today's Date: 07/06/2020   History of Present Illness  86 y.o. male with medical history significant of prostate/bladder CA; PVD; DM; and COPD presenting with hypoxia to 14s by home health nurse.  He was last seen on 10/21 with weakness and possible fever, CXR with ?infiltrate.  Increased confusion per family.  Clinical Impression  Pt admitted with above diagnosis. Pt needing +2 max assist to stand and transfer to chair with heavy posterior lean as well as right lateral lean.O2 would not register but pt in no distress during treatment.  Other VSS.  Pt will need Rehab as he does not have 24 hour care and will need this at current level.   Pt currently with functional limitations due to the deficits listed below (see PT Problem List). Pt will benefit from skilled PT to increase their independence and safety with mobility to allow discharge to the venue listed below.      Follow Up Recommendations SNF;Supervision/Assistance - 24 hour    Equipment Recommendations  None recommended by PT    Recommendations for Other Services       Precautions / Restrictions Precautions Precautions: Fall Restrictions Weight Bearing Restrictions: No Other Position/Activity Restrictions: Sacral wound      Mobility  Bed Mobility Overal bed mobility: Needs Assistance Bed Mobility: Supine to Sit     Supine to sit: Min assist     General bed mobility comments: Pt needed assist for LEs off bed and for elevation of trunk.     Transfers Overall transfer level: Needs assistance Equipment used: Rolling walker (2 wheeled) Transfers: Sit to/from [facility] Sit to Stand: Max assist;+2 physical assistance;From elevated surface Stand pivot transfers: Max assist;+2 physical assistance Squat pivot transfers: Max assist;+2 safety/equipment     General transfer comment: Pt needed max assist to  stand.  Also max of 2 to pivot with pt getting 1/2 way to the chair and scissoring feet neeidng incr assist to control descent into chair. Pt with flexed hips and knees as well as flexed trunk.   Ambulation/Gait                Stairs            Wheelchair Mobility    Modified Rankin (Stroke Patients Only)       Balance Overall balance assessment: Needs assistance Sitting-balance support: No upper extremity supported Sitting balance-Leahy Scale: Fair   Postural control: Right lateral lean;Posterior lean Standing balance support: Bilateral upper extremity supported Standing balance-Leahy Scale: Zero Standing balance comment: Pt with heavy posterior lean in standing and cannot stand fully upright.                              Pertinent Vitals/Pain Pain Assessment: Faces Faces Pain Scale: Hurts a little bit Pain Location: Right leg Pain Descriptors / Indicators: Aching;Discomfort;Grimacing;Guarding    Home Living Family/patient expects to be discharged to:: Private residence Living Arrangements: Alone Available Help at Discharge: Family Type of Home: Apartment Home Access: Level entry     Home Layout: One level Home Equipment: Environmental consultant - 2 wheels;Walker - 4 wheels;Shower seat;Bedside commode;Hospital bed;Grab bars - tub/shower;Adaptive equipment;Tub bench Additional Comments: Reacher and long shoe horn. Pt sleep in a regular bed.    Prior Function Level of Independence: Needs assistance   Gait / Transfers Assistance Needed: Pt states he used RW at home.  STayed inbed alot   ADL's / Homemaking Assistance Needed: Assist with showering, dresed self, feeds self,  pt cooked per pt  Comments: pt states he fell out of bed     Hand Dominance   Dominant Hand: Right    Extremity/Trunk Assessment   Upper Extremity Assessment Upper Extremity Assessment: Defer to OT evaluation    Lower Extremity Assessment Lower Extremity Assessment: RLE  deficits/detail;LLE deficits/detail RLE Deficits / Details: grossly 2+/5 LLE Deficits / Details: grossly 2+/5    Cervical / Trunk Assessment Cervical / Trunk Assessment: Kyphotic  Communication   Communication: No difficulties  Cognition Arousal/Alertness: Awake/alert Behavior During Therapy: Anxious Overall Cognitive Status: Impaired/Different from baseline Area of Impairment: Memory;Safety/judgement;Orientation                 Orientation Level: Time;Situation   Memory: Decreased short-term memory   Safety/Judgement: Decreased awareness of deficits            General Comments General comments (skin integrity, edema, etc.): 170/97, 88 bpm    Exercises     Assessment/Plan    PT Assessment Patient needs continued PT services  PT Problem List Decreased activity tolerance;Decreased balance;Decreased mobility;Decreased knowledge of use of DME;Decreased safety awareness;Decreased knowledge of precautions;Decreased coordination       PT Treatment Interventions DME instruction;Gait training;Functional mobility training;Therapeutic activities;Therapeutic exercise;Balance training;Patient/family education    PT Goals (Current goals can be found in the Care Plan section)  Acute Rehab PT Goals Patient Stated Goal: I guess i need to go get therapy again PT Goal Formulation: With patient Time For Goal Achievement: 07/20/20 Potential to Achieve Goals: Good    Frequency Min 2X/week   Barriers to discharge Decreased caregiver support      Co-evaluation               AM-PAC PT "6 Clicks" Mobility  Outcome Measure Help needed turning from your back to your side while in a flat bed without using bedrails?: A Lot Help needed moving from lying on your back to sitting on the side of a flat bed without using bedrails?: A Lot Help needed moving to and from a bed to a chair (including a wheelchair)?: Total Help needed standing up from a chair using your arms (e.g.,  wheelchair or bedside chair)?: Total Help needed to walk in hospital room?: Total Help needed climbing 3-5 steps with a railing? : Total 6 Click Score: 8    End of Session Equipment Utilized During Treatment: Gait belt Activity Tolerance: Patient limited by fatigue Patient left: in chair;with call bell/phone within reach;with chair alarm set Nurse Communication: Mobility status PT Visit Diagnosis: Unsteadiness on feet (R26.81);Muscle weakness (generalized) (M62.81);Pain Pain - Right/Left: Right Pain - part of body: Hip    Time: 0912-0948 PT Time Calculation (min) (ACUTE ONLY): 36 min   Charges:   PT Evaluation $PT Eval Moderate Complexity: 1 Mod PT Treatments $Therapeutic Activity: 8-22 mins        Bralyn Espino W,PT Acute Rehabilitation Services Pager:  [phone]  Office:  [place] 07/06/2020, 12:41 PM

## 2020-07-06 NOTE — Consult Note (Signed)
[place] Nurse Consult Note: Patient receiving care in [place] Reason for Consult: Sacral wound Wound type: Stage 2 on the right hip Pressure Injury POA: Yes Measurement: 3 cm x 6.5 cm Wound bed: 75% pink/red, 25% yellow Drainage (amount, consistency, odor) Tan/brown drainage on foam dressing Periwound: Intact Dressing procedure/placement/frequency: Wash right hip wound with NS and apply Aquacel (790240) then cover with 4x4 and foam dressing. Change daily.  Keep patient off of the right side to the greatest extent possible.   Monitor the wound area(s) for worsening of condition such as: Signs/symptoms of infection, increase in size, development of or worsening of odor, development of pain, or increased pain at the affected locations.   Notify the medical team if any of these develop.  Thank you for the consult. [place] nurse will not follow at this time.   Please re-consult the [place] team if needed.  Cathlean Marseilles Tamala Julian, MSN, RN, [place], Lysle Pearl, [facility] Wound Treatment Associate Pager [phone]

## 2020-07-06 NOTE — Progress Notes (Signed)
PROGRESS NOTE    Geoffrey Frank  WRU:[id number]  DOB: 03/04/1926  DOA: 07/05/2020 PCP: Glendale Chard, MD Outpatient Specialists:   Hospital course:  86 year old with COPD, DM 2, PVD and prostate/bladder CA was admitted 07/05/2020 with hypoxia down to 70% O2 sat on RA.  CXR revealed RLL CAP.  Of note patient had been in house from 05/18/2020 to 05/22/2020 and treated for RLE cellulitis, UTI and altered mental status.  Subjective:  "I feel fine doc" his main concern was that he did not find the recliner to be comfortable "but they want me to be sitting up".  Patient states he lives at home alone and uses his microwave to warm up food.  Notes he has 7 frozen dinners at home waiting to be warmed.  Spoke with patient's granddaughter who is also his POA.  She is very concerned about his safety at home.  He has declined 24-hour care in the past but she is now working 2 jobs and is not sure she is going to be able to support him the way that she has in the past.  Objective: Vitals:   07/06/20 0347 07/06/20 0347 07/06/20 0825 07/06/20 1147  BP:  (!) 160/98 (!) 145/92 125/87  Pulse:  (!) 102 91 90  Resp:  20  16  Temp:  98.3 F (36.8 C)  97.7 F (36.5 C)  TempSrc:  Oral  Oral  SpO2:  94%  (!) 78%  Weight: 78 kg     Height:        Intake/Output Summary (Last 24 hours) at 07/06/2020 1423 Last data filed at 07/06/2020 0400 Gross per 24 hour  Intake 822.06 ml  Output 350 ml  Net 472.06 ml   Filed Weights   07/05/20 1218 07/05/20 2107 07/06/20 0347  Weight: 76.2 kg 78 kg 78 kg     Exam:  General: Charming elderly gentleman sitting in recliner in NAD, conversant and articulate. Eyes: sclera anicteric, conjuctiva mild injection bilaterally CVS: S1-S2, regular  Respiratory:  decreased air entry bilaterally secondary to decreased inspiratory effort, rales at bases  GI: NABS, soft, NT  LE: 2+ edema up to knees bilaterally. Neuro: A/O x 3, Moving all extremities equally with  normal strength, CN 3-12 intact, grossly nonfocal.  Psych: patient is logical and coherent, judgement and insight appear normal, mood and affect appropriate to situation.   Assessment & Plan:   CAP Patient with RLL infiltrate on CXR He apparently had significant oxygen requirement yesterday however today he is at 95% on room air. Its possible that he is having some mucous plugging as he does seem to have a deep cough. Procalcitonin is 0.77, with curb 65 of 3 with a 14.5% risk of death and a PSI class V with a 27% mortality. Continue azithromycin and ceftriaxone  AKI Kidney function about the same as yesterday despite treatment of infection Increase normal saline from 75 cc an hour to 125 cc an hour as he has increased insensible losses.  CHF EF of 50% with grade 1 diastolic dysfunction, normal BNP, compensated Extremity edema may well be venous stasis disease Consider Unna boots either as an inpatient or an outpatient Also of note his low albumin  Abnormal UA Unclear if this is truly an infection versus just colonization In any case Rocephin should cover likely bacteria  HTN Continue amlodipine and metoprolol Hold all losartan due to AKI  Stage II pressure ulcer Wound care consult placed  COPD No wheezing on exam although  he does have decreased air entry bilaterally As needed duo nebs  Disposition Discussed with patient's granddaughter who is concerned about patient living by himself. Patient apparently feels like he will be dying soon so does not want to be placed. Family has declined potential hospice placement in the past. We will need to continue conversation regarding best place for this patient to live safely  Bladder/prostate CA NED, continue outpatient plan in place    DVT prophylaxis: Lovenox Code Status: DNR Family Communication: Spoke at length with the patient's granddaughter [name] Disposition Plan:   Patient is from: Home  Anticipated  Discharge Location: TBD  Barriers to Discharge: Ongoing treatment for CAP  Is patient medically stable for Discharge: No   Consultants:  None  Procedures:  None  Antimicrobials:  Ceftriaxone  Azithromycin   Data Reviewed:  Basic Metabolic Panel: Recent Labs  Lab 07/05/20 1225 07/06/20 0230  NA 138 140  K 4.8 4.8  CL 102 103  CO2 26 22  GLUCOSE 97 130*  BUN 32* 39*  CREATININE 1.49* 1.60*  CALCIUM 9.1 9.3  MG  --  2.0   Liver Function Tests: Recent Labs  Lab 07/05/20 1225  AST 30  ALT 16  ALKPHOS 53  BILITOT 1.3*  PROT 5.3*  ALBUMIN 2.7*   No results for input(s): LIPASE, AMYLASE in the last 168 hours. No results for input(s): AMMONIA in the last 168 hours. CBC: Recent Labs  Lab 07/05/20 1225 07/06/20 0230  WBC 6.7 7.7  NEUTROABS 5.1 6.6  HGB 9.6* 10.2*  HCT 31.7* 33.6*  MCV 80.3 78.3*  PLT 159 161   Cardiac Enzymes: No results for input(s): CKTOTAL, CKMB, CKMBINDEX, TROPONINI in the last 168 hours. BNP (last 3 results) No results for input(s): PROBNP in the last 8760 hours. CBG: No results for input(s): GLUCAP in the last 168 hours.  Recent Results (from the past 240 hour(s))  Urine culture     Status: Abnormal   Collection Time: 06/29/20 12:15 PM   Specimen: Urine, Random  Result Value Ref Range Status   Specimen Description   Final    URINE, RANDOM Performed at [facility] [address] [place], [place] [address]    Special Requests   Final    NONE Performed at [facility], [place] [address] [place], [place] [address]    Culture MULTIPLE SPECIES PRESENT, SUGGEST RECOLLECTION (A)  Final   Report Status 06/30/2020 FINAL  Final  Respiratory Panel by RT PCR (Flu A&B, Covid) - Nasopharyngeal Swab     Status: None   Collection Time: 06/29/20 12:15 PM   Specimen: Nasopharyngeal Swab  Result Value Ref Range Status   SARS Coronavirus 2 by RT PCR NEGATIVE NEGATIVE Final    Comment:  (NOTE) SARS-CoV-2 target nucleic acids are NOT DETECTED.  The SARS-CoV-2 RNA is generally detectable in upper respiratoy specimens during the acute phase of infection. The lowest concentration of SARS-CoV-2 viral copies this assay can detect is 131 copies/mL. A negative result does not preclude SARS-Cov-2 infection and should not be used as the sole basis for treatment or other patient management decisions. A negative result may occur with  improper specimen collection/handling, submission of specimen other than nasopharyngeal swab, presence of viral mutation(s) within the areas targeted by this assay, and inadequate number of viral copies (<131 copies/mL). A negative result must be combined with clinical observations, patient history, and epidemiological information. The expected result is Negative.  Fact Sheet for Patients:  PinkCheek.be  Fact Sheet for Healthcare Providers:  GravelBags.it  This test is no t yet approved or cleared by the [place] FDA and  has been authorized for detection and/or diagnosis of SARS-CoV-2 by FDA under an Emergency Use Authorization (EUA). This EUA will remain  in effect (meaning this test can be used) for the duration of the COVID-19 declaration under Section 564(b)(1) of the Act, 21 U.S.C. section 360bbb-3(b)(1), unless the authorization is terminated or revoked sooner.     Influenza A by PCR NEGATIVE NEGATIVE Final   Influenza B by PCR NEGATIVE NEGATIVE Final    Comment: (NOTE) The Xpert Xpress SARS-CoV-2/FLU/RSV assay is intended as an aid in  the diagnosis of influenza from Nasopharyngeal swab specimens and  should not be used as a sole basis for treatment. Nasal washings and  aspirates are unacceptable for Xpert Xpress SARS-CoV-2/FLU/RSV  testing.  Fact Sheet for Patients: PinkCheek.be  Fact Sheet for Healthcare  Providers: GravelBags.it  This test is not yet approved or cleared by the [place] FDA and  has been authorized for detection and/or diagnosis of SARS-CoV-2 by  FDA under an Emergency Use Authorization (EUA). This EUA will remain  in effect (meaning this test can be used) for the duration of the  Covid-19 declaration under Section 564(b)(1) of the Act, 21  U.S.C. section 360bbb-3(b)(1), unless the authorization is  terminated or revoked. Performed at [facility], [place] [address] [place], [place] [address]   Urine culture     Status: Abnormal   Collection Time: 07/05/20  2:00 PM   Specimen: Urine, Catheterized  Result Value Ref Range Status   Specimen Description URINE, CATHETERIZED  Final   Special Requests NONE  Final   Culture (A)  Final    <10,000 COLONIES/mL INSIGNIFICANT GROWTH Performed at [facility], [place] [address], [place], [place] [address]    Report Status 07/06/2020 FINAL  Final  Resp Panel by RT PCR (RSV, Flu A&B, Covid) - Nasopharyngeal Swab     Status: None   Collection Time: 07/05/20  2:11 PM   Specimen: Nasopharyngeal Swab  Result Value Ref Range Status   SARS Coronavirus 2 by RT PCR NEGATIVE NEGATIVE Final    Comment: (NOTE) SARS-CoV-2 target nucleic acids are NOT DETECTED.  The SARS-CoV-2 RNA is generally detectable in upper respiratoy specimens during the acute phase of infection. The lowest concentration of SARS-CoV-2 viral copies this assay can detect is 131 copies/mL. A negative result does not preclude SARS-Cov-2 infection and should not be used as the sole basis for treatment or other patient management decisions. A negative result may occur with  improper specimen collection/handling, submission of specimen other than nasopharyngeal swab, presence of viral mutation(s) within the areas targeted by this assay, and inadequate number of viral copies (<131 copies/mL). A negative result must be  combined with clinical observations, patient history, and epidemiological information. The expected result is Negative.  Fact Sheet for Patients:  PinkCheek.be  Fact Sheet for Healthcare Providers:  GravelBags.it  This test is no t yet approved or cleared by the [place] FDA and  has been authorized for detection and/or diagnosis of SARS-CoV-2 by FDA under an Emergency Use Authorization (EUA). This EUA will remain  in effect (meaning this test can be used) for the duration of the COVID-19 declaration under Section 564(b)(1) of the Act, 21 U.S.C. section 360bbb-3(b)(1), unless the authorization is terminated or revoked sooner.     Influenza A by PCR NEGATIVE NEGATIVE  Final   Influenza B by PCR NEGATIVE NEGATIVE Final    Comment: (NOTE) The Xpert Xpress SARS-CoV-2/FLU/RSV assay is intended as an aid in  the diagnosis of influenza from Nasopharyngeal swab specimens and  should not be used as a sole basis for treatment. Nasal washings and  aspirates are unacceptable for Xpert Xpress SARS-CoV-2/FLU/RSV  testing.  Fact Sheet for Patients: PinkCheek.be  Fact Sheet for Healthcare Providers: GravelBags.it  This test is not yet approved or cleared by the [place] FDA and  has been authorized for detection and/or diagnosis of SARS-CoV-2 by  FDA under an Emergency Use Authorization (EUA). This EUA will remain  in effect (meaning this test can be used) for the duration of the  Covid-19 declaration under Section 564(b)(1) of the Act, 21  U.S.C. section 360bbb-3(b)(1), unless the authorization is  terminated or revoked.    Respiratory Syncytial Virus by PCR NEGATIVE NEGATIVE Final    Comment: (NOTE) Fact Sheet for Patients: PinkCheek.be  Fact Sheet for Healthcare Providers: GravelBags.it  This test is  not yet approved or cleared by the [place] FDA and  has been authorized for detection and/or diagnosis of SARS-CoV-2 by  FDA under an Emergency Use Authorization (EUA). This EUA will remain  in effect (meaning this test can be used) for the duration of the  COVID-19 declaration under Section 564(b)(1) of the Act, 21 U.S.C.  section 360bbb-3(b)(1), unless the authorization is terminated or  revoked. Performed at [facility], [place] [address], [place], [place] [address]       Studies: DG Chest 2 View  Result Date: 07/05/2020 CLINICAL DATA:  Shortness of breath EXAM: CHEST - 2 VIEW COMPARISON:  June 29, 2020 FINDINGS: Degree of inspiration is shallow. There is elevation left hemidiaphragm, stable. There is a persistent right pleural effusion with atelectatic change and ill-defined airspace opacity in the right base. Left lung is clear. Heart size and pulmonary vascular normal. No adenopathy. There is aortic atherosclerosis. No appreciable bone lesions. IMPRESSION: Suspected small focus of pneumonia with atelectasis and effusion in the right base region, similar to recent study. No new opacity evident. Stable cardiac silhouette. No adenopathy evident by radiography. Aortic Atherosclerosis (ICD10-I70.0). Electronically Signed   By: Lowella Grip III M.D.   On: 07/05/2020 12:55     Scheduled Meds: . amLODipine  5 mg Oral Daily  . aspirin EC  81 mg Oral Daily  . enoxaparin (LOVENOX) injection  40 mg Subcutaneous Q24H  . metoprolol tartrate  25 mg Oral BID   Continuous Infusions: . sodium chloride 75 mL/hr at 07/05/20 2001  . azithromycin    . cefTRIAXone (ROCEPHIN)  IV      Principal Problem:   CAP (community acquired pneumonia) Active Problems:   COPD (chronic obstructive pulmonary disease) ([place])   PROSTATE CANCER, HX OF   Bladder cancer ([place])   Essential hypertension   Chronic diastolic heart failure ([facility])   Pressure ulcer of sacral region, stage 2 ([place])   AKI  (acute kidney injury) ([place])     Kinnley Paulson Tublu Jacy Howat, [facility]  If 7PM-7AM, please contact night-coverage [email or web address] Password [email or web address] 07/06/2020, 2:24 PM    LOS: 1 day

## 2020-07-06 NOTE — Progress Notes (Signed)
Initial Nutrition Assessment  DOCUMENTATION CODES:   Non-severe (moderate) malnutrition in context of chronic illness  INTERVENTION:   -Ensure Enlive po BID, each supplement provides 350 kcal and 20 grams of protein -Magic cup TID with meals, each supplement provides 290 kcal and 9 grams of protein -MVI with minerals daily -Downgrade diet to dysphagia 3 diet (advanced mechanical soft) for ease of intake  NUTRITION DIAGNOSIS:   Moderate Malnutrition related to chronic illness (prostate/bladder cancer, COPD) as evidenced by mild fat depletion, moderate fat depletion, moderate muscle depletion, severe muscle depletion.  GOAL:   Patient will meet greater than or equal to 90% of their needs  MONITOR:   PO intake, Supplement acceptance, Labs, Weight trends, Skin, I & O's  REASON FOR ASSESSMENT:   Consult Assessment of nutrition requirement/status, Wound healing  ASSESSMENT:   Geoffrey Frank is a 86 y.o. male with medical history significant of prostate/bladder CA; PVD; DM; and COPD presenting with hypoxia to 71s by home health nurse.  He was last seen on 10/21 with weakness and possible fever, CXR with ?infiltrate.  Pt admitted with CAP, AKI, and UTI.   Reviewed I/O's: +472 ml x 24 hours  UOP: 350 ml x 24 hours  Spoke with pt, who was sitting in recliner chair at time of visit, eating lunch. He reports feeling a little better today. Noted pt consumed a few potato chips and two containers of ice cream. He shares that hard textured food is difficult for him and it often takes him a long time to eat due to this.   PTA he was consuming 2-3 meals per day- frequently consumed foods included cereal, fruit and yogurt, and pork and beans.   Pt unsure of UBW, but reports he has lost "a lot of weight" over the past few months. He shares with this RD that he was hospitalized a few months ago s/p fall and had just returned home from a rehab facility a few weeks PTA. Per wt hx, no wt loss  over the past 5 months; suspect edema may be masking further weight loss as well as fat and muscle depletions.  Discussed with pt importance of good meal and supplement intake to promote healing. He is amenable to diet downgrade, [employer], and [employer].   Labs reviewed.   NUTRITION - FOCUSED PHYSICAL EXAM:    Most Recent Value  Orbital Region Moderate depletion  Upper Arm Region Moderate depletion  Thoracic and Lumbar Region Mild depletion  Buccal Region Mild depletion  Temple Region Moderate depletion  Clavicle Bone Region Severe depletion  Clavicle and Acromion Bone Region Severe depletion  Scapular Bone Region Severe depletion  Dorsal Hand Moderate depletion  Patellar Region Moderate depletion  Anterior Thigh Region Mild depletion  Posterior Calf Region Mild depletion  Edema (RD Assessment) Moderate  Hair Reviewed  Eyes Reviewed  Mouth Reviewed  Skin Reviewed  Nails Reviewed       Diet Order:   Diet Order            DIET DYS 3 Room service appropriate? Yes with Assist; Fluid consistency: Thin  Diet effective now                 EDUCATION NEEDS:   Education needs have been addressed  Skin:  Skin Assessment: Skin Integrity Issues: Skin Integrity Issues:: Stage II Stage II: rt hip  Last BM:  07/04/20  Height:   Ht Readings from Last 1 Encounters:  07/05/20 6' (1.829 m)  Weight:   Wt Readings from Last 1 Encounters:  07/06/20 78 kg    Ideal Body Weight:  80.9 kg  BMI:  Body mass index is 23.33 kg/m.  Estimated Nutritional Needs:   Kcal:  2150-2350  Protein:  105-120 grams  Fluid:  > 2 L    [name], RD, LDN, [place] Registered Dietitian II Certified Diabetes Care and Education Specialist Please refer to [facility] for RD and/or RD on-call/weekend/after hours pager

## 2020-07-06 NOTE — TOC Initial Note (Signed)
Transition of Care [facility]) - Initial/Assessment Note    Patient Details  Name: Geoffrey Frank MRN: [id number] Date of Birth: [birth year]  [facility]) CM/SW Contact:    [address], [place] Phone Number: 07/06/2020, 3:44 PM  Clinical Narrative:                 CSW received consult for possible SNF placement at time of discharge. CSW spoke with patient's granddaughter Ms. Maxwell due to patients fluctuating orientation regarding PT recommendation of SNF placement at time of discharge. Ms. Zigmund Daniel reported that patient's is currently unable to care for himself at his home given his current physical needs and fall risk. Ms. Zigmund Daniel expressed understanding of PT recommendation and is agreeable to SNF placement at time of discharge. Patient reports preference for [facility]. CSW discussed insurance authorization process and provided Medicare SNF ratings list. Patient has received the COVID vaccines. Ms. Zigmund Daniel expressed being hopeful for rehab and to feel better soon. No further questions reported at this time. CSW to continue to follow and assist with discharge planning needs.        Patient Goals and CMS Choice        Expected Discharge Plan and Services                                                Prior Living Arrangements/Services                       Activities of Daily Living Home Assistive Devices/Equipment: Shower chair with back, Environmental consultant (specify type) ADL Screening (condition at time of admission) Patient's cognitive ability adequate to safely complete daily activities?: Yes Is the patient deaf or have difficulty hearing?: Yes Does the patient have difficulty seeing, even when wearing glasses/contacts?: No Does the patient have difficulty concentrating, remembering, or making decisions?: Yes Patient able to express need for assistance with ADLs?: Yes Does the patient have difficulty dressing or bathing?: Yes Independently performs  ADLs?: Yes (appropriate for developmental age) Does the patient have difficulty walking or climbing stairs?: Yes Weakness of Legs: Both Weakness of Arms/Hands: None  Permission Sought/Granted                  Emotional Assessment              Admission diagnosis:  CAP (community acquired pneumonia) [J18.9] Acute cystitis without hematuria [N30.00] Community acquired pneumonia, unspecified laterality [J18.9] Patient Active Problem List   Diagnosis Date Noted  . CAP (community acquired pneumonia) 07/05/2020  . Pressure ulcer of sacral region, stage 2 ([place]) 07/05/2020  . AKI (acute kidney injury) ([place]) 07/05/2020  . PAD (peripheral artery disease) ([place]) 05/26/2020  . Cellulitis of right lower extremity 05/26/2020  . Chronic venous insufficiency 05/26/2020  . H/O falling 05/26/2020  . Benign hypertensive renal disease 05/18/2020  . Chronic renal disease, stage II 05/18/2020  . Chronic diastolic heart failure ([place]) 05/18/2020  . Cellulitis 05/18/2020  . SVT (supraventricular tachycardia) ([place]) 03/25/2016  . Epistaxis 03/08/2015  . Essential hypertension 02/01/2014  . Chest pain 02/01/2014  . Bladder cancer ([address]) 10/14/2011  . CHRONIC RHINITIS 09/17/2007  . COPD (chronic obstructive pulmonary disease) ([place]) 09/17/2007  . DEGENERATIVE DISC DISEASE 09/17/2007  . PROSTATE CANCER, HX OF 09/17/2007   PCP:  Glendale Chard, MD Pharmacy:   [id number] -  [place], [place] [address] Phone: [phone] Fax: [phone]     Social Determinants of Health (SDOH) Interventions    Readmission Risk Interventions No flowsheet data found.

## 2020-07-07 LAB — CBC WITH DIFFERENTIAL/PLATELET
Abs Immature Granulocytes: 0.04 10*3/uL (ref 0.00–0.07)
Basophils Absolute: 0 10*3/uL (ref 0.0–0.1)
Basophils Relative: 0 %
Eosinophils Absolute: 0 10*3/uL (ref 0.0–0.5)
Eosinophils Relative: 0 %
HCT: 30.2 % — ABNORMAL LOW (ref 39.0–52.0)
Hemoglobin: 9.3 g/dL — ABNORMAL LOW (ref 13.0–17.0)
Immature Granulocytes: 0 %
Lymphocytes Relative: 6 %
Lymphs Abs: 0.6 10*3/uL — ABNORMAL LOW (ref 0.7–4.0)
MCH: 23.9 pg — ABNORMAL LOW (ref 26.0–34.0)
MCHC: 30.8 g/dL (ref 30.0–36.0)
MCV: 77.6 fL — ABNORMAL LOW (ref 80.0–100.0)
Monocytes Absolute: 0.8 10*3/uL (ref 0.1–1.0)
Monocytes Relative: 9 %
Neutro Abs: 7.6 10*3/uL (ref 1.7–7.7)
Neutrophils Relative %: 85 %
Platelets: 139 10*3/uL — ABNORMAL LOW (ref 150–400)
RBC: 3.89 MIL/uL — ABNORMAL LOW (ref 4.22–5.81)
RDW: 14.7 % (ref 11.5–15.5)
WBC: 9 10*3/uL (ref 4.0–10.5)
nRBC: 0.2 % (ref 0.0–0.2)

## 2020-07-07 LAB — BASIC METABOLIC PANEL
Anion gap: 11 (ref 5–15)
BUN: 39 mg/dL — ABNORMAL HIGH (ref 8–23)
CO2: 24 mmol/L (ref 22–32)
Calcium: 8.7 mg/dL — ABNORMAL LOW (ref 8.9–10.3)
Chloride: 104 mmol/L (ref 98–111)
Creatinine, Ser: 1.41 mg/dL — ABNORMAL HIGH (ref 0.61–1.24)
GFR, Estimated: 47 mL/min — ABNORMAL LOW (ref >60)
Glucose, Bld: 100 mg/dL — ABNORMAL HIGH (ref 70–99)
Potassium: 4.6 mmol/L (ref 3.5–5.1)
Sodium: 139 mmol/L (ref 135–145)

## 2020-07-07 MED ORDER — AZITHROMYCIN 500 MG PO TABS
500.0000 mg | ORAL_TABLET | Freq: Every day | ORAL | Status: DC
  Administered 2020-07-07 – 2020-07-09 (×3): 500 mg via ORAL
  Filled 2020-07-07 (×3): qty 1

## 2020-07-07 NOTE — Progress Notes (Signed)
PROGRESS NOTE    Geoffrey Frank  DEY:[id number]  DOB: 08/27/1926  DOA: 07/05/2020 PCP: Glendale Chard, MD Outpatient Specialists:   Hospital course:  86 year old with COPD, DM 2, PVD and prostate/bladder CA was admitted 07/05/2020 with hypoxia down to 70% O2 sat on RA.  CXR revealed RLL CAP.  Of note patient had been in house from 05/18/2020 to 05/22/2020 and treated for RLE cellulitis, UTI and altered mental status.  Subjective:  Patient himself has no complaints.  However states he does not really remember talking with me yesterday.  I reminded him of some of our conversation that he says vaguely "yes, yes I remember".  Patient states he is doing okay.  Admits that he is feeling quite weak.  When I mentioned that he will probably need to go to rehab because he is so weak he agrees that might be a good idea.  Objective: Vitals:   07/07/20 0050 07/07/20 0330 07/07/20 0903 07/07/20 1124  BP:  138/90 (!) 117/91 121/81  Pulse:  85 84 78  Resp:  16 16 18   Temp:  98 F (36.7 C) 97.7 F (36.5 C) 98.3 F (36.8 C)  TempSrc:  Oral Oral Oral  SpO2:  100%  93%  Weight: 78.9 kg     Height:        Intake/Output Summary (Last 24 hours) at 07/07/2020 1421 Last data filed at 07/07/2020 1246 Gross per 24 hour  Intake 4477.13 ml  Output 1450 ml  Net 3027.13 ml   Filed Weights   07/05/20 2107 07/06/20 0347 07/07/20 0050  Weight: 78 kg 78 kg 78.9 kg     Exam:  General: Tired appearing gentleman sitting up in bed feeding himself breakfast. Eyes: sclera anicteric, conjuctiva mild injection bilaterally CVS: S1-S2, regular  Respiratory:  decreased air entry bilaterally secondary to decreased inspiratory effort, rales at bases  GI: NABS, soft, NT  LE: 2+ edema up to knees bilaterally. Neuro:  grossly nonfocal.  Psych: patient is logical and coherent, judgement and insight appear normal, mood and affect appropriate to situation.   Assessment & Plan:   86 year old male who was  admitted with RLL infiltrate, being treated for CAP on azithromycin and ceftriaxone.  Patient is status post recent admission for treatment of cellulitis/UTI and altered mental status and was discharged to SNF.  CAP Continue ceftriaxone and azithromycin day #2, patient is oxygenating well on room air. Will need to see how patient does with exertion.  AKI Modest improvement in renal function with increased saline to 125 cc an hour. Patient seems to be eating reasonably well. Will continue hydration at 125 cc an hour with stop date for tomorrow. This can be restarted tomorrow if warranted based upon creatinine.  Abnormal UA Unclear if this is truly an infection versus just colonization In any case Rocephin should cover likely bacteria  CHF EF of 50% with grade 1 diastolic dysfunction, normal BNP, compensated Extremity edema may well be venous stasis disease Consider Unna boots either as an inpatient or an outpatient Also of note his low albumin  Disposition PT has recommended SNF upon discharge. Appreciate TOC coordinating with patient's granddaughter to find appropriate placement. Family has declined potential hospice placement in the past, they may be more open to this going forward.  HTN Continue amlodipine and metoprolol Hold all losartan due to AKI  Stage II pressure ulcer Wound care consult placed  COPD No wheezing on exam although he does have decreased air entry bilaterally As needed  duo nebs  Bladder/prostate CA NED, continue outpatient plan in place    DVT prophylaxis: Lovenox Code Status: DNR Family Communication: Spoke at length with the patient's granddaughter Geoffrey Frank yesterday. Disposition Plan:   Patient is from: Home  Anticipated Discharge Location: TBD  Barriers to Discharge: Ongoing treatment for CAP, will also need a SNF placement bed.  Is patient medically stable for Discharge:  No   Consultants:  None  Procedures:  None  Antimicrobials:  Ceftriaxone  Azithromycin   Data Reviewed:  Basic Metabolic Panel: Recent Labs  Lab 07/05/20 1225 07/06/20 0230 07/07/20 0321  NA 138 140 139  K 4.8 4.8 4.6  CL 102 103 104  CO2 26 22 24   GLUCOSE 97 130* 100*  BUN 32* 39* 39*  CREATININE 1.49* 1.60* 1.41*  CALCIUM 9.1 9.3 8.7*  MG  --  2.0  --    Liver Function Tests: Recent Labs  Lab 07/05/20 1225  AST 30  ALT 16  ALKPHOS 53  BILITOT 1.3*  PROT 5.3*  ALBUMIN 2.7*   No results for input(s): LIPASE, AMYLASE in the last 168 hours. No results for input(s): AMMONIA in the last 168 hours. CBC: Recent Labs  Lab 07/05/20 1225 07/06/20 0230 07/07/20 0321  WBC 6.7 7.7 9.0  NEUTROABS 5.1 6.6 7.6  HGB 9.6* 10.2* 9.3*  HCT 31.7* 33.6* 30.2*  MCV 80.3 78.3* 77.6*  PLT 159 161 139*   Cardiac Enzymes: No results for input(s): CKTOTAL, CKMB, CKMBINDEX, TROPONINI in the last 168 hours. BNP (last 3 results) No results for input(s): PROBNP in the last 8760 hours. CBG: No results for input(s): GLUCAP in the last 168 hours.  Recent Results (from the past 240 hour(s))  Urine culture     Status: Abnormal   Collection Time: 06/29/20 12:15 PM   Specimen: Urine, Random  Result Value Ref Range Status   Specimen Description   Final    URINE, RANDOM Performed at [facility] [address] [place], [place] [address]    Special Requests   Final    NONE Performed at [facility], [place] [address] [place], [place] [address]    Culture MULTIPLE SPECIES PRESENT, SUGGEST RECOLLECTION (A)  Final   Report Status 06/30/2020 FINAL  Final  Respiratory Panel by RT PCR (Flu A&B, Covid) - Nasopharyngeal Swab     Status: None   Collection Time: 06/29/20 12:15 PM   Specimen: Nasopharyngeal Swab  Result Value Ref Range Status   SARS Coronavirus 2 by RT PCR NEGATIVE NEGATIVE Final    Comment: (NOTE) SARS-CoV-2 target nucleic  acids are NOT DETECTED.  The SARS-CoV-2 RNA is generally detectable in upper respiratoy specimens during the acute phase of infection. The lowest concentration of SARS-CoV-2 viral copies this assay can detect is 131 copies/mL. A negative result does not preclude SARS-Cov-2 infection and should not be used as the sole basis for treatment or other patient management decisions. A negative result may occur with  improper specimen collection/handling, submission of specimen other than nasopharyngeal swab, presence of viral mutation(s) within the areas targeted by this assay, and inadequate number of viral copies (<131 copies/mL). A negative result must be combined with clinical observations, patient history, and epidemiological information. The expected result is Negative.  Fact Sheet for Patients:  PinkCheek.be  Fact Sheet for Healthcare Providers:  GravelBags.it  This test is no t yet approved or cleared by the [place] FDA and  has been authorized for detection and/or diagnosis  of SARS-CoV-2 by FDA under an Emergency Use Authorization (EUA). This EUA will remain  in effect (meaning this test can be used) for the duration of the COVID-19 declaration under Section 564(b)(1) of the Act, 21 U.S.C. section 360bbb-3(b)(1), unless the authorization is terminated or revoked sooner.     Influenza A by PCR NEGATIVE NEGATIVE Final   Influenza B by PCR NEGATIVE NEGATIVE Final    Comment: (NOTE) The Xpert Xpress SARS-CoV-2/FLU/RSV assay is intended as an aid in  the diagnosis of influenza from Nasopharyngeal swab specimens and  should not be used as a sole basis for treatment. Nasal washings and  aspirates are unacceptable for Xpert Xpress SARS-CoV-2/FLU/RSV  testing.  Fact Sheet for Patients: PinkCheek.be  Fact Sheet for Healthcare Providers: GravelBags.it  This test  is not yet approved or cleared by the [place] FDA and  has been authorized for detection and/or diagnosis of SARS-CoV-2 by  FDA under an Emergency Use Authorization (EUA). This EUA will remain  in effect (meaning this test can be used) for the duration of the  Covid-19 declaration under Section 564(b)(1) of the Act, 21  U.S.C. section 360bbb-3(b)(1), unless the authorization is  terminated or revoked. Performed at [facility], [place] [address], [place], [place] [address]   Urine culture     Status: Abnormal   Collection Time: 07/05/20  2:00 PM   Specimen: Urine, Catheterized  Result Value Ref Range Status   Specimen Description URINE, CATHETERIZED  Final   Special Requests NONE  Final   Culture (A)  Final    <10,000 COLONIES/mL INSIGNIFICANT GROWTH Performed at [facility], [place] [address], [place], [place] [address]    Report Status 07/06/2020 FINAL  Final  Resp Panel by RT PCR (RSV, Flu A&B, Covid) - Nasopharyngeal Swab     Status: None   Collection Time: 07/05/20  2:11 PM   Specimen: Nasopharyngeal Swab  Result Value Ref Range Status   SARS Coronavirus 2 by RT PCR NEGATIVE NEGATIVE Final    Comment: (NOTE) SARS-CoV-2 target nucleic acids are NOT DETECTED.  The SARS-CoV-2 RNA is generally detectable in upper respiratoy specimens during the acute phase of infection. The lowest concentration of SARS-CoV-2 viral copies this assay can detect is 131 copies/mL. A negative result does not preclude SARS-Cov-2 infection and should not be used as the sole basis for treatment or other patient management decisions. A negative result may occur with  improper specimen collection/handling, submission of specimen other than nasopharyngeal swab, presence of viral mutation(s) within the areas targeted by this assay, and inadequate number of viral copies (<131 copies/mL). A negative result must be combined with clinical observations, patient history, and  epidemiological information. The expected result is Negative.  Fact Sheet for Patients:  PinkCheek.be  Fact Sheet for Healthcare Providers:  GravelBags.it  This test is no t yet approved or cleared by the [place] FDA and  has been authorized for detection and/or diagnosis of SARS-CoV-2 by FDA under an Emergency Use Authorization (EUA). This EUA will remain  in effect (meaning this test can be used) for the duration of the COVID-19 declaration under Section 564(b)(1) of the Act, 21 U.S.C. section 360bbb-3(b)(1), unless the authorization is terminated or revoked sooner.     Influenza A by PCR NEGATIVE NEGATIVE Final   Influenza B by PCR NEGATIVE NEGATIVE Final    Comment: (NOTE) The Xpert Xpress SARS-CoV-2/FLU/RSV assay is intended as an aid in  the diagnosis of influenza from Nasopharyngeal  swab specimens and  should not be used as a sole basis for treatment. Nasal washings and  aspirates are unacceptable for Xpert Xpress SARS-CoV-2/FLU/RSV  testing.  Fact Sheet for Patients: PinkCheek.be  Fact Sheet for Healthcare Providers: GravelBags.it  This test is not yet approved or cleared by the [place] FDA and  has been authorized for detection and/or diagnosis of SARS-CoV-2 by  FDA under an Emergency Use Authorization (EUA). This EUA will remain  in effect (meaning this test can be used) for the duration of the  Covid-19 declaration under Section 564(b)(1) of the Act, 21  U.S.C. section 360bbb-3(b)(1), unless the authorization is  terminated or revoked.    Respiratory Syncytial Virus by PCR NEGATIVE NEGATIVE Final    Comment: (NOTE) Fact Sheet for Patients: PinkCheek.be  Fact Sheet for Healthcare Providers: GravelBags.it  This test is not yet approved or cleared by the [place] FDA and   has been authorized for detection and/or diagnosis of SARS-CoV-2 by  FDA under an Emergency Use Authorization (EUA). This EUA will remain  in effect (meaning this test can be used) for the duration of the  COVID-19 declaration under Section 564(b)(1) of the Act, 21 U.S.C.  section 360bbb-3(b)(1), unless the authorization is terminated or  revoked. Performed at [facility], [place] [address], [place], [place] [address]       Studies: No results found.   Scheduled Meds: . amLODipine  5 mg Oral Daily  . aspirin EC  81 mg Oral Daily  . azithromycin  500 mg Oral Daily  . enoxaparin (LOVENOX) injection  40 mg Subcutaneous Q24H  . feeding supplement  237 mL Oral BID BM  . metoprolol tartrate  25 mg Oral BID  . multivitamin with minerals  1 tablet Oral Daily   Continuous Infusions: . sodium chloride 125 mL/hr at 07/07/20 1414  . cefTRIAXone (ROCEPHIN)  IV 1 g (07/07/20 1403)    Principal Problem:   CAP (community acquired pneumonia) Active Problems:   COPD (chronic obstructive pulmonary disease) ([place])   PROSTATE CANCER, HX OF   Bladder cancer ([place])   Essential hypertension   Chronic diastolic heart failure (HCC)   Pressure ulcer of sacral region, stage 2 ([place])   AKI (acute kidney injury) ([place])     Berklie Dethlefs Tublu Vylette Strubel, [facility]  If 7PM-7AM, please contact night-coverage [email or web address] Password [email or web address] 07/07/2020, 2:21 PM    LOS: 2 days

## 2020-07-07 NOTE — Consult Note (Signed)
   [facility] Inpatient Consult   07/07/2020  KEM HENSEN 1925/12/15 [id number]   [facility] [ACO] Patient: Mcarthur Rossetti Medicare PPO   Patient screened for high risk score for unplanned readmission score and to check if potential [place] Management service needs.  Review of patient's medical record reveals patient is being recommended for a skilled nursing facility stay. This patient is in an [facility] practice.  Primary Care Provider is Glendale Chard, MD [facility], this provider is listed to provide the transition of care [TOC] for post hospital follow up.  Plan:  Alerted the [facility] Chronic Care Management team of admission and current disposition recommendation.  For questions contact:   Natividad Brood, RN BSN [place] [facility] Liaison  [phone] business mobile phone Toll free office [phone]  Fax number: [phone] [email or web address]

## 2020-07-07 NOTE — Progress Notes (Addendum)
UPDATE:  10/29-Ms. Maxwell returned my call, stated she understood, requested that we see what offers came and go from there. CSW will reach out to some SNF's and see if there are any offers and follow up with Ms. Maxwell.  10/28 CSW tried to speak with pt's granddaughter, D. Maxwell, again to discuss dc options but had to leave a vm. After consulting with [facility] admissions rep, pt is in copay status and has a balance from the previous stay there. Pt's balance is $760 and would need to be paid in order for pt to return. Pt appealed the dc with and was denied. Pt's family is interested in long term placement however [facility] has no long term beds and doesn't think any will come available anytime soon. When CSW spoke with Ms. Maxwell previously, she stated that the family was aware that pt was in copay status and had no problem paying the copay, but the balance still remains with [employer]. Insurance will be a barrier for placement of patient as he is in copay days and per family there is no one to stay with him. CSW will continue to follow and explore options for family.

## 2020-07-08 ENCOUNTER — Other Ambulatory Visit: Payer: Self-pay | Admitting: Internal Medicine

## 2020-07-08 DIAGNOSIS — E440 Moderate protein-calorie malnutrition: Secondary | ICD-10-CM | POA: Insufficient documentation

## 2020-07-08 LAB — BASIC METABOLIC PANEL
Anion gap: 10 (ref 5–15)
BUN: 34 mg/dL — ABNORMAL HIGH (ref 8–23)
CO2: 22 mmol/L (ref 22–32)
Calcium: 8.5 mg/dL — ABNORMAL LOW (ref 8.9–10.3)
Chloride: 108 mmol/L (ref 98–111)
Creatinine, Ser: 1.12 mg/dL (ref 0.61–1.24)
GFR, Estimated: >60 mL/min (ref >60)
Glucose, Bld: 82 mg/dL (ref 70–99)
Potassium: 5 mmol/L (ref 3.5–5.1)
Sodium: 140 mmol/L (ref 135–145)

## 2020-07-08 MED ORDER — SODIUM CHLORIDE 0.9 % IV SOLN: INTRAVENOUS | Status: DC

## 2020-07-08 MED ORDER — LORAZEPAM 2 MG/ML IJ SOLN
0.5000 mg | Freq: Once | INTRAMUSCULAR | Status: AC
  Administered 2020-07-08: 0.5 mg via INTRAVENOUS
  Filled 2020-07-08: qty 1

## 2020-07-08 NOTE — Progress Notes (Signed)
PROGRESS NOTE    Geoffrey Frank  KGY:[id number] DOB: [birth year] DOA: 07/05/2020 PCP: Glendale Chard, MD  Brief Narrative:  86 year old African-American male COPD not on home oxygen Prostate and bladder cancer HTN HFpEF Recent admission 9/9 through 05/22/2020 with extremity cellulitis-at that time was told to follow-up outpatient with vascular surgery-also found to have ulceration right lateral foot and great toe During hospital stay had metabolic encephalopathy  Brought back to the hospital [facility] with hypoxia fever and possible infiltrate on x-ray Found to have community-acquired pneumonia on admission and was treated previously 06/29/2020 with doxycycline was started on azithromycin and Rocephin and gentle IV fluid and home diuretics were held   Assessment & Plan:   Principal Problem:   CAP (community acquired pneumonia) Active Problems:   COPD (chronic obstructive pulmonary disease) ([place])   PROSTATE CANCER, HX OF   Bladder cancer ([place])   Essential hypertension   Chronic diastolic heart failure (HCC)   Pressure ulcer of sacral region, stage 2 (HCC)   AKI (acute kidney injury) ([place])   Malnutrition of moderate degree   1. Community-acquired pneumonia a. Patient has no specific symptoms at this time and does not seem short of breath and is not requiring oxygen b. Repeat CXR two-view today c. Continue azithromycin and ceftriaxone with possibility of narrowing the same soon 2. AKI a. GFR 40-->60 and improved from admission, continue saline 50 cc/H and saline lock in t morning b. Baseline creatinine is less than 1.0 3. Mild hyperkalemia a. Not on any offending agents or supplements b. Monitor 4. Abnormal UA a. Urine culture 10/27 showed no acute infection less than 10,000 CFU  b. With narrow to only pneumonia coverage going forward 5. Congestive heart failure HFpEF 50 percent EF a. No acute component he has underlying edema probably other sources such as his  amlodipine b. Lasix from PTA has been held 6. HTN a. Moderately controlled-continue at this time amlodipine 5 mg daily, metoprolol 25 twice daily b. Holding olmesartan component and Lasix 40 7. Stage II pressure ulcer 8. COPD not requiring oxygen at home a. Not on inhalers at home 9. Prostate bladder cancer a. Need outpatient characterization by his urologist  DVT prophylaxis: Lovenox Code Status: Full Family Communication: None at the bedside Disposition: Inpatient pending resolution and narrowing of antibiotics suspect may need skilled Nursing to see the patient Keep patient out of bed  Status is: Inpatient  Remains inpatient appropriate because:Persistent severe electrolyte disturbances, Ongoing active pain requiring inpatient pain management and IV treatments appropriate due to intensity of illness or inability to take PO   Dispo: The patient is from: Home              Anticipated d/c is to: Unclear at need to see mobility              Anticipated d/c date is: 2 days              Patient currently is not medically stable to d/c.       Consultants:   None  Procedures: None  Antimicrobials: Ceftriaxone azithromycin   Subjective: Nursing reports some confusion and intermittent words however he seemed coherent to me He is in no distress and is sitting up at the bedside without oxygen He can tell me date time year but thinks that he is lost his keys At times he thinks it is October but then corrects himself rapidly   Objective: Vitals:   07/07/20 2043 07/07/20 2208 07/08/20 0400  07/08/20 0624  BP:  117/73  130/75  Pulse: 87 86  79  Resp:    20  Temp:    97.6 F (36.4 C)  TempSrc:    Oral  SpO2: 100%   100%  Weight:   70.3 kg   Height:        Intake/Output Summary (Last 24 hours) at 07/08/2020 0746 Last data filed at 07/08/2020 5852 Gross per 24 hour  Intake 3863.07 ml  Output 1150 ml  Net 2713.07 ml   Filed Weights   07/06/20 0347 07/07/20 0050  07/08/20 0400  Weight: 78 kg 78.9 kg 70.3 kg    Examination:  General exam: EOMI NCAT no focal deficit  Respiratory system: Chest is clear with no added sound no rales no rhonchi no fremitus no rales no Cardiovascular system: D7-O2 holosystolic murmur across precordium-on monitors is normal sinus rhythm with PVCs Gastrointestinal system: Obese nontender no distention no rebound no guarding. Central nervous system: Neurologically intact no focal deficit Extremities: Lower extremity nonpitting edema Skin: As above Psychiatry: Euthymic and congruent  Data Reviewed: I have personally reviewed following labs and imaging studies  Potassium 5.0 BUNs/creatinine down from 32/1.4-->34/1.2 Hemoglobin 9.3 platelet 139  Radiology Studies: No results found.   Scheduled Meds: . amLODipine  5 mg Oral Daily  . aspirin EC  81 mg Oral Daily  . azithromycin  500 mg Oral Daily  . enoxaparin (LOVENOX) injection  40 mg Subcutaneous Q24H  . feeding supplement  237 mL Oral BID BM  . metoprolol tartrate  25 mg Oral BID  . multivitamin with minerals  1 tablet Oral Daily   Continuous Infusions: . sodium chloride 125 mL/hr at 07/07/20 2209  . cefTRIAXone (ROCEPHIN)  IV 1 g (07/07/20 1403)     LOS: 3 days    Time spent: Micco, MD [facility] To contact the attending provider between 7A-7P or the covering provider during after hours 7P-7A, please log into the web site [email or web address] and access using universal [place] password for that web site. If you do not have the password, please call the hospital operator.  07/08/2020, 7:46 AM

## 2020-07-08 NOTE — Plan of Care (Signed)
  Problem: Education: Goal: Knowledge of General Education information will improve Description: Including pain rating scale, medication(s)/side effects and non-pharmacologic comfort measures Outcome: Progressing   Problem: Health Behavior/Discharge Planning: Goal: Ability to manage health-related needs will improve Outcome: Progressing   Problem: Clinical Measurements: Goal: Ability to maintain clinical measurements within normal limits will improve Outcome: Progressing Goal: Will remain free from infection Outcome: Progressing Goal: Diagnostic test results will improve Outcome: Progressing Goal: Respiratory complications will improve Outcome: Progressing Goal: Cardiovascular complication will be avoided Outcome: Progressing   Problem: Activity: Goal: Risk for activity intolerance will decrease Outcome: Progressing   Problem: Nutrition: Goal: Adequate nutrition will be maintained Outcome: Progressing   Problem: Elimination: Goal: Will not experience complications related to bowel motility Outcome: Progressing   Problem: Skin Integrity: Goal: Risk for impaired skin integrity will decrease Outcome: Progressing

## 2020-07-09 ENCOUNTER — Inpatient Hospital Stay (HOSPITAL_COMMUNITY): Payer: Medicare PPO

## 2020-07-09 LAB — COMPREHENSIVE METABOLIC PANEL
ALT: 27 U/L (ref 0–44)
AST: 30 U/L (ref 15–41)
Albumin: 2.6 g/dL — ABNORMAL LOW (ref 3.5–5.0)
Alkaline Phosphatase: 58 U/L (ref 38–126)
Anion gap: 6 (ref 5–15)
BUN: 29 mg/dL — ABNORMAL HIGH (ref 8–23)
CO2: 26 mmol/L (ref 22–32)
Calcium: 8.8 mg/dL — ABNORMAL LOW (ref 8.9–10.3)
Chloride: 108 mmol/L (ref 98–111)
Creatinine, Ser: 0.93 mg/dL (ref 0.61–1.24)
GFR, Estimated: >60 mL/min (ref >60)
Glucose, Bld: 85 mg/dL (ref 70–99)
Potassium: 4.1 mmol/L (ref 3.5–5.1)
Sodium: 140 mmol/L (ref 135–145)
Total Bilirubin: 0.6 mg/dL (ref 0.3–1.2)
Total Protein: 5 g/dL — ABNORMAL LOW (ref 6.5–8.1)

## 2020-07-09 LAB — CBC WITH DIFFERENTIAL/PLATELET
Abs Immature Granulocytes: 0.02 10*3/uL (ref 0.00–0.07)
Basophils Absolute: 0 10*3/uL (ref 0.0–0.1)
Basophils Relative: 0 %
Eosinophils Absolute: 0.1 10*3/uL (ref 0.0–0.5)
Eosinophils Relative: 2 %
HCT: 28.8 % — ABNORMAL LOW (ref 39.0–52.0)
Hemoglobin: 8.5 g/dL — ABNORMAL LOW (ref 13.0–17.0)
Immature Granulocytes: 0 %
Lymphocytes Relative: 12 %
Lymphs Abs: 0.8 10*3/uL (ref 0.7–4.0)
MCH: 23.7 pg — ABNORMAL LOW (ref 26.0–34.0)
MCHC: 29.5 g/dL — ABNORMAL LOW (ref 30.0–36.0)
MCV: 80.2 fL (ref 80.0–100.0)
Monocytes Absolute: 0.6 10*3/uL (ref 0.1–1.0)
Monocytes Relative: 9 %
Neutro Abs: 5 10*3/uL (ref 1.7–7.7)
Neutrophils Relative %: 77 %
Platelets: 122 10*3/uL — ABNORMAL LOW (ref 150–400)
RBC: 3.59 MIL/uL — ABNORMAL LOW (ref 4.22–5.81)
RDW: 15 % (ref 11.5–15.5)
WBC: 6.5 10*3/uL (ref 4.0–10.5)
nRBC: 0 % (ref 0.0–0.2)

## 2020-07-09 LAB — SARS CORONAVIRUS 2 BY RT PCR (HOSPITAL ORDER, PERFORMED IN ~~LOC~~ HOSPITAL LAB): SARS Coronavirus 2: NEGATIVE

## 2020-07-09 MED ORDER — CEFDINIR 300 MG PO CAPS
300.0000 mg | ORAL_CAPSULE | Freq: Two times a day (BID) | ORAL | Status: DC
  Administered 2020-07-09 – 2020-07-10 (×3): 300 mg via ORAL
  Filled 2020-07-09 (×3): qty 1

## 2020-07-09 MED ORDER — LORAZEPAM 2 MG/ML IJ SOLN
0.5000 mg | Freq: Once | INTRAMUSCULAR | Status: DC
  Filled 2020-07-09: qty 1

## 2020-07-09 NOTE — Progress Notes (Addendum)
PROGRESS NOTE    Geoffrey Frank  PNT:[id number] DOB: 1925/10/12 DOA: 07/05/2020 PCP: Glendale Chard, MD  Brief Narrative:  86 year old African-American male COPD not on home oxygen Prostate and bladder cancer HTN HFpEF Recent admission 9/9 through 05/22/2020 with extremity cellulitis-at that time was told to follow-up outpatient with vascular surgery-also found to have ulceration right lateral foot and great toe During hospital stay had metabolic encephalopathy  Brought back to the hospital [facility] with hypoxia fever and possible infiltrate on x-ray Found to have community-acquired pneumonia on admission and was treated previously 06/29/2020 with doxycycline was started on azithromycin and Rocephin and gentle IV fluid and home diuretics were held   Assessment & Plan:   Principal Problem:   CAP (community acquired pneumonia) Active Problems:   COPD (chronic obstructive pulmonary disease) ([place])   PROSTATE CANCER, HX OF   Bladder cancer ([place])   Essential hypertension   Chronic diastolic heart failure (HCC)   Pressure ulcer of sacral region, stage 2 (HCC)   AKI (acute kidney injury) ([place])   Malnutrition of moderate degree   1. Community-acquired pneumonia a. Patient has no specific symptoms at this time and does not seem short of breath and is not requiring oxygen b. Repeat CXR two-view 10/30 shows persistent pneumonia c. Transitioning ceftriaxone azithromycin as no fever -->Omnicef 10/31 2. AKI a. Creatinine improved saline lock 10/31 and force fluids b. Baseline creatinine is less than 1.0 3. Mild hyperkalemia a. Not on any offending agents or supplements b. Monitor 4. Abnormal UA a. Urine culture 10/27 showed no acute infection less than 10,000 CFU  b. With narrow to only pneumonia coverage going forward 5. Congestive heart failure HFpEF 50 percent EF a. No acute component he has underlying edema probably other sources such as his amlodipine b. Lasix from PTA has  been held 6. HTN a. Moderately controlled-continue at this time amlodipine 5 mg daily, metoprolol 25 twice daily b. Holding olmesartan component and Lasix 40 7. Stage II pressure ulcer 8. COPD not requiring oxygen at home a. Not on inhalers at home 9. Prostate bladder cancer a. Need outpatient characterization by his urologist  DVT prophylaxis: Lovenox Code Status: Full Family Communication: called to d/w Ms Zigmund Daniel [phone] but no answer today Disposition:  likely can discharge to skilled facility as early 07/11/2019 1 AM Covid test has been ordered in anticipation Status is: Inpatient  Remains inpatient appropriate because:Persistent severe electrolyte disturbances, Ongoing active pain requiring inpatient pain management and IV treatments appropriate due to intensity of illness or inability to take PO   Dispo: The patient is from: Home              Anticipated d/c is to: Unclear at need to see mobility              Anticipated d/c date is: 2 days              Patient currently is not medically stable to d/c.       Consultants:   None  Procedures: None  Antimicrobials: Ceftriaxone azithromycin   Subjective:  Confused and sundowning yesterday pm needing ativan Max therapy with Therapy today Overall interactive--[place] but can tell me that he is in [place]  Objective: Vitals:   07/08/20 0624 07/08/20 1949 07/09/20 0513 07/09/20 0514  BP: 130/75 (!) 147/95 (!) 146/76   Pulse: 79 86 78   Resp: 20 18 18    Temp: 97.6 F (36.4 C) 97.7 F (  36.5 C) (!) 97.5 F (36.4 C)   TempSrc: Oral Oral Oral   SpO2: 100% 90% 94%   Weight:    70.9 kg  Height:        Intake/Output Summary (Last 24 hours) at 07/09/2020 1003 Last data filed at 07/08/2020 2200 Gross per 24 hour  Intake 425 ml  Output 525 ml  Net -100 ml   Filed Weights   07/07/20 0050 07/08/20 0400 07/09/20 0514  Weight: 78.9 kg 70.3 kg 70.9 kg     Examination:  General exam: EOMI NCAT frail appearing Respiratory system: No rales rhonchis Cardiovascular system: I9-C7 holosystolic murmur across precordium-sinus with PVCs on monitor Gastrointestinal system: Obese nontender no distention no rebound no guarding. Central nervous system: Neurologically intact no focal deficit Extremities: Lower extremity nonpitting edema Skin: As above Psychiatry: Euthymic and congruent  Data Reviewed: I have personally reviewed following labs and imaging studies  Potassium 5.0--> 4.1 BUNs/creatinine down from 32/1.4-->34/1.2-->29/0.9 Hemoglobin 9.3-->8.5 White count 6.5 Platelet 139-->122  Radiology Studies: DG Chest 2 View  Result Date: 07/09/2020 CLINICAL DATA:  Pneumonia follow-up EXAM: CHEST - 2 VIEW COMPARISON:  07/05/2020 FINDINGS: Low lung volumes. Persistent right perihilar and basilar opacity. Small right pleural effusion. Decreased right basilar atelectasis. Stable heart size. IMPRESSION: Persistent right perihilar and basilar opacity. Similar small right pleural effusion. Decreased right basilar atelectasis. Electronically Signed   By: Macy Mis M.D.   On: 07/09/2020 09:05     Scheduled Meds: . amLODipine  5 mg Oral Daily  . aspirin EC  81 mg Oral Daily  . azithromycin  500 mg Oral Daily  . enoxaparin (LOVENOX) injection  40 mg Subcutaneous Q24H  . feeding supplement  237 mL Oral BID BM  . metoprolol tartrate  25 mg Oral BID  . multivitamin with minerals  1 tablet Oral Daily   Continuous Infusions: . sodium chloride 50 mL/hr at 07/08/20 1754  . cefTRIAXone (ROCEPHIN)  IV 1 g (07/08/20 1422)     LOS: 4 days    Time spent: 25  Nita Sells, MD [facility] To contact the attending provider between 7A-7P or the covering provider during after hours 7P-7A, please log into the web site [email or web address] and access using universal [place] password for that web site. If you do not have the password, please  call the hospital operator.  07/09/2020, 10:03 AM

## 2020-07-09 NOTE — Plan of Care (Signed)
  Problem: Elimination: Goal: Will not experience complications related to urinary retention Outcome: Completed/Met   Problem: Pain Managment: Goal: General experience of comfort will improve Outcome: Completed/Met

## 2020-07-09 NOTE — TOC Progression Note (Signed)
Transition of Care [facility]) - Progression Note    Patient Details  Name: Geoffrey Frank MRN: [id number] Date of Birth: 1926/05/06  Transition of Care [facility]) CM/SW [place], [place] Phone Number: [phone] 07/09/2020, 11:07 AM  Clinical Narrative:     CSW spoke with patient's granddaughter Dequise and provided her with the 3 bed offers. She explained that they are seeking for patient to transition into a long term placement. Dequise said that she would review the offers.  CSW followed up again with [facility] and confirmed that patient had a balance and they do not have a long term bed. CSW called [facility] and they confirmed that if the plan if for a long term bed that they do not have long term beds.   TOC team will continue to assist with discharge planning needs.  Expected Discharge Plan: [place] Barriers to Discharge: Continued Medical Work up, Ship broker  Expected Discharge Plan and Services Expected Discharge Plan: [place] In-house Referral: Clinical Social Work     Living arrangements for the past 2 months: Single Family Home                                       Social Determinants of Health (SDOH) Interventions    Readmission Risk Interventions No flowsheet data found.

## 2020-07-09 NOTE — Progress Notes (Signed)
Physical Therapy Treatment Patient Details Name: Geoffrey Frank MRN: [id number] DOB: [birth year] Today's Date: 07/09/2020    History of Present Illness 86 y.o. male with medical history significant of prostate/bladder CA; PVD; DM; and COPD presenting with hypoxia to 13s by home health nurse.  He was last seen on 10/21 with weakness and possible fever, CXR with ?infiltrate.  Increased confusion per family.    PT Comments    Pt remains confused and heavy assist for all mobility. Continue to recommend SNF.    Follow Up Recommendations  SNF;Supervision/Assistance - 24 hour     Equipment Recommendations  None recommended by PT    Recommendations for Other Services       Precautions / Restrictions Precautions Precautions: Fall Restrictions Other Position/Activity Restrictions: Sacral wound    Mobility  Bed Mobility Overal bed mobility: Needs Assistance Bed Mobility: Supine to Sit     Supine to sit: Max assist     General bed mobility comments: Assist to bring legs off of bed, elevate trunk into standing, and bring hips to EOB  Transfers Overall transfer level: Needs assistance Equipment used: Ambulation equipment used Transfers: Sit to/from Stand Sit to Stand: Max assist;+2 physical assistance;From elevated surface         General transfer comment: Assist to bring hips up. Pt with posterior lean and flexed hips despite use of Stedy. Used Stedy for bed to chair.   Ambulation/Gait             General Gait Details: Unable   Marine scientist [place] (Stroke Patients Only)       Balance Overall balance assessment: Needs assistance Sitting-balance support: Bilateral upper extremity supported;Feet supported Sitting balance-Leahy Scale: Poor Sitting balance - Comments: min to min guard assist for sitting EOB Postural control: Posterior lean Standing balance support: Bilateral upper extremity supported Standing  balance-Leahy Scale: Zero Standing balance comment: Pt with heavy posterior lean in standing and cannot stand fully upright.                             Cognition Arousal/Alertness: Awake/alert Behavior During Therapy: Flat affect Overall Cognitive Status: Impaired/Different from baseline Area of Impairment: Memory;Safety/judgement;Orientation;Attention;Following commands;Problem solving                 Orientation Level: Time;Situation;Place Current Attention Level: Sustained Memory: Decreased short-term memory;Decreased recall of precautions Following Commands: Follows one step commands with increased time Safety/Judgement: Decreased awareness of deficits;Decreased awareness of safety   Problem Solving: Slow processing;Decreased initiation;Requires verbal cues;Requires tactile cues        Exercises      General Comments        Pertinent Vitals/Pain Pain Assessment: Faces Faces Pain Scale: No hurt    Home Living                      Prior Function            PT Goals (current goals can now be found in the care plan section) Progress towards PT goals: Not progressing toward goals - comment    Frequency    Min 2X/week      PT Plan Current plan remains appropriate    Co-evaluation              AM-PAC PT "6 Clicks" Mobility   Outcome Measure  Help  needed turning from your back to your side while in a flat bed without using bedrails?: A Lot Help needed moving from lying on your back to sitting on the side of a flat bed without using bedrails?: Total Help needed moving to and from a bed to a chair (including a wheelchair)?: Total Help needed standing up from a chair using your arms (e.g., wheelchair or bedside chair)?: Total Help needed to walk in hospital room?: Total Help needed climbing 3-5 steps with a railing? : Total 6 Click Score: 7    End of Session Equipment Utilized During Treatment: Gait belt Activity Tolerance:  Patient limited by fatigue Patient left: in chair;with call bell/phone within reach;with chair alarm set Nurse Communication: Mobility status;Need for lift equipment PT Visit Diagnosis: Unsteadiness on feet (R26.81);Muscle weakness (generalized) (M62.81);Pain Pain - Right/Left: Right Pain - part of body: Hip     Time: 2241-1464 PT Time Calculation (min) (ACUTE ONLY): 15 min  Charges:  $Therapeutic Activity: 8-22 mins                     [place] Pager [phone] Office [place] 07/09/2020, 11:00 AM

## 2020-07-09 NOTE — Progress Notes (Signed)
Patient sat up in geri chair good part of the day refused to go back to bed daughter was present and aware. Daughter stated felt like her dad more confused reported has been his baseline since the weekend. Patient unable to ambulate at all was a total transfer to bed and chair. No further changes noted.

## 2020-07-10 LAB — COMPREHENSIVE METABOLIC PANEL
ALT: 26 U/L (ref 0–44)
AST: 31 U/L (ref 15–41)
Albumin: 2.6 g/dL — ABNORMAL LOW (ref 3.5–5.0)
Alkaline Phosphatase: 59 U/L (ref 38–126)
Anion gap: 8 (ref 5–15)
BUN: 30 mg/dL — ABNORMAL HIGH (ref 8–23)
CO2: 26 mmol/L (ref 22–32)
Calcium: 9 mg/dL (ref 8.9–10.3)
Chloride: 105 mmol/L (ref 98–111)
Creatinine, Ser: 0.9 mg/dL (ref 0.61–1.24)
GFR, Estimated: >60 mL/min (ref >60)
Glucose, Bld: 111 mg/dL — ABNORMAL HIGH (ref 70–99)
Potassium: 4.6 mmol/L (ref 3.5–5.1)
Sodium: 139 mmol/L (ref 135–145)
Total Bilirubin: 0.8 mg/dL (ref 0.3–1.2)
Total Protein: 5 g/dL — ABNORMAL LOW (ref 6.5–8.1)

## 2020-07-10 LAB — CBC WITH DIFFERENTIAL/PLATELET
Abs Immature Granulocytes: 0.03 10*3/uL (ref 0.00–0.07)
Basophils Absolute: 0 10*3/uL (ref 0.0–0.1)
Basophils Relative: 0 %
Eosinophils Absolute: 0.1 10*3/uL (ref 0.0–0.5)
Eosinophils Relative: 2 %
HCT: 29.2 % — ABNORMAL LOW (ref 39.0–52.0)
Hemoglobin: 8.8 g/dL — ABNORMAL LOW (ref 13.0–17.0)
Immature Granulocytes: 1 %
Lymphocytes Relative: 14 %
Lymphs Abs: 0.7 10*3/uL (ref 0.7–4.0)
MCH: 24.3 pg — ABNORMAL LOW (ref 26.0–34.0)
MCHC: 30.1 g/dL (ref 30.0–36.0)
MCV: 80.7 fL (ref 80.0–100.0)
Monocytes Absolute: 0.5 10*3/uL (ref 0.1–1.0)
Monocytes Relative: 10 %
Neutro Abs: 3.8 10*3/uL (ref 1.7–7.7)
Neutrophils Relative %: 73 %
Platelets: 134 10*3/uL — ABNORMAL LOW (ref 150–400)
RBC: 3.62 MIL/uL — ABNORMAL LOW (ref 4.22–5.81)
RDW: 15.3 % (ref 11.5–15.5)
WBC: 5.2 10*3/uL (ref 4.0–10.5)
nRBC: 0.4 % — ABNORMAL HIGH (ref 0.0–0.2)

## 2020-07-10 MED ORDER — LINACLOTIDE 145 MCG PO CAPS: 145.0000 ug | ORAL_CAPSULE | ORAL | Status: AC

## 2020-07-10 MED ORDER — ENSURE ENLIVE PO LIQD: 237.0000 mL | Freq: Two times a day (BID) | ORAL | Status: AC

## 2020-07-10 MED ORDER — AMLODIPINE BESYLATE 5 MG PO TABS: 5.0000 mg | ORAL_TABLET | Freq: Every day | ORAL | Status: AC

## 2020-07-10 MED ORDER — ADULT MULTIVITAMIN W/MINERALS CH: 1.0000 | ORAL_TABLET | Freq: Every day | ORAL | Status: AC

## 2020-07-10 NOTE — Discharge Summary (Addendum)
Physician Discharge Summary  Geoffrey Frank ZCH:[id number] DOB: 05/10/1926  PCP: Geoffrey Chard, MD  Admitted from: Home Discharged to: SNF  Admit date: 07/05/2020 Discharge date: 07/10/2020  Recommendations for Outpatient Follow-up:    Contact information for follow-up providers    MD at SNF. Schedule an appointment as soon as possible for a visit.   Why: To be seen in 2 to 3 days with repeat labs (CBC & BMP).       Geoffrey Chard, MD. Schedule an appointment as soon as possible for a visit.   Specialty: Internal Medicine Why: Upon discharge from SNF. Contact information: [address] [place] [address] [phone]        Geoffrey Casino, MD .   Specialty: Cardiology Contact information: [address] [place] [address] [phone]            Contact information for after-discharge care    Destination    [facility] Preferred SNF .   Service: Skilled Nursing Contact information: [address] [place]: N/A    Equipment/Devices: TBD at SNF    Discharge Condition: Improved and stable.   Code Status: DNR Diet recommendation:  Discharge Diet Orders (From admission, onward)    Start     Ordered   07/10/20 0000  Diet - low sodium heart healthy       Comments: Diet consistency: Dysphagia 3 diet and thin liquids.   07/10/20 1540           Discharge Diagnoses:  Principal Problem:   CAP (community acquired pneumonia) Active Problems:   COPD (chronic obstructive pulmonary disease) (HCC)   PROSTATE CANCER, HX OF   Bladder cancer (HCC)   Essential hypertension   Chronic diastolic heart failure (HCC)   Pressure ulcer of sacral region, stage 2 (HCC)   AKI (acute kidney injury) ([place])   Malnutrition of moderate degree   Brief Summary: 86 year old male with PMH of prostate/bladder cancer, PVD, DM 2, HTN, COPD not on home  oxygen, chronic diastolic CHF, presented to the ED with hypoxia to the 70s by home health nurse.  He was admitted for suspected community-acquired pneumonia that failed outpatient treatment.  Rest of HPI as per admitting physician as copied below:  "HPI: Geoffrey Frank is a 87 y.o. male with medical history significant of prostate/bladder CA; PVD; DM; and COPD presenting with hypoxia to 69s by home health nurse.  He was last seen on 10/21 with weakness and possible fever, CXR with ?infiltrate.  He had normal vitals and appeared well and so was discharged with antibiotics for CAP.   MOST form from 06/20/20 - full treatment but DNR.  Per granddaughter, he has been reporting that he won't be alive as of November.  He reports that he was having a "catheter or something in me that was leaking."  +coughing.  No fever.  Periodic SOB.  Symptoms have been going on for 2-3 days.  His granddaughter has noticed some hearing issues.  She has noticed some confusion; his nurse thinks that he may have another UTI.  He has "benign" but ongoing bladder/prostate cancer and sees urology q6 months.  His granddaughter reports that he is "willing to do what is necessary" but probably wouldn't have surgery if needed.  He would accept IVF, antibiotics, but has DNR in place.  He has not been mobile.  She is not sure if he has been receiving his fluid pills - the swelling went down significantly when he was in rehab earlier this month.  He has always worn compression socks but had a large leg wound prior to rehab that has since healed; compression socks were discontinued because of the wounds.  He was admitted from 9/9-13 and discharged to SNF.  During that admission, he was treated for RLE cellulitis; symptomatic UTI; AMS; dehydration; frequent falls.     ED Course:  Worsening weakness and cough since d/c for PNA, with Doxy.  Stage 2 hip wound, chronic.  To 70s at home, placed on NRB.  Now on room air.  +LE edema, ?UTI,  non-compliant with home meds.  Given Rocephin, Azith.  IVF due to elevated lactate and Lasix for edema.  Feet are cold but dopplerable."  Assessment and plan:  1. Community-acquired pneumonia: Appears that he failed outpatient oral antibiotic/doxycycline.  Has now completed almost 7 days of antibiotics (IV azithromycin 10/27-10/28, oral azithromycin 10/29-10/31, IV ceftriaxone 10/27-10/30, cefdinir 10/31-11/1).  No further antibiotics at discharge.  Repeat chest x-ray 10/31 shows persistent right perihilar and basilar opacity, similar small right pleural effusion and decreased right basilar atelectasis.  His chest x-ray may be lagging his clinical response.  Recommend follow-up chest x-ray in 4 weeks.  Urine pneumococcal antigen negative. 2. Acute kidney injury complicating stage II chronic kidney disease: Creatinine was 0.94 on 10/21.  Creatinine peaked to 1.6.  Likely multifactorial due to infection, poor oral intake, ongoing ARB and Lasix use.  These meds were temporarily held.  He may have gotten brief IV fluids.  Creatinine has normalized.  ARB discontinued.  Lasix resumed due to history of CHF.  Follow BMP closely at SNF. 3. Hyperkalemia: The highest potassium noted was 5.  Improved. 4. Abnormal UA/asymptomatic bacteriuria: Urine culture showed insignificant growth. 5. Chronic diastolic CHF: Compensated.  Resume prior home dose of Lasix 40 mg daily with close monitoring at SNF.  If his oral intake is poor or his creatinine worsens again, may have to hold his Lasix and consider palliative care consultation for goals of care. 6. Essential hypertension: Controlled on amlodipine alone.  Olmesartan discontinued due to AKI and recurrent risk for AKI. 7. COPD not on home oxygen: Stable without clinical bronchospasm. 8. Prostate and bladder cancer: Outpatient follow-up with urology.  UA 10/27 showed microscopic hematuria and this can again be followed up by urology or PCP as outpatient. 9. Acute metabolic  encephalopathy: Likely due to acute illness complicating advanced age and possible underlying cognitive impairment/dementia.  Minimize precipitants such as opioids, narcotics, sedatives etc.  Delirium precautions.  Consider palliative care consultation for goals of care Pressure Injury 07/05/20 Hip Right Stage 2 -  Partial thickness loss of dermis presenting as a shallow open injury with a red, pink wound bed without slough. (Active)  07/05/20 2130  Location: Hip  Location Orientation: Right  Staging: Stage 2 -  Partial thickness loss of dermis presenting as a shallow open injury with a red, pink wound bed without slough.  Wound Description (Comments):   Present on Admission: Yes  10. Anemia: Unclear etiology.  May be chronic disease.  Hemoglobin stable in the mid 8 g range compared to yesterday.  No bleeding reported.  Follow CBC at SNF. 11. Thrombocytopenia: Mild.  May be due to infectious etiology versus antibiotic use.  Improved compared to yesterday.  Follow CBC at SNF.   Consultations:  None  Procedures:  None   Discharge Instructions  Discharge Instructions    (HEART FAILURE PATIENTS) Call MD:  Anytime you have any of the following symptoms: 1) 3 pound weight gain in 24 hours or 5 pounds in 1 week 2) shortness of breath, with or without a dry hacking cough 3) swelling in the hands, feet or stomach 4) if you have to sleep on extra pillows at night in order to breathe.   Complete by: As directed    Call MD for:   Complete by: As directed    Worsening confusion or altered mental status.   Call MD for:  difficulty breathing, headache or visual disturbances   Complete by: As directed    Call MD for:  extreme fatigue   Complete by: As directed    Call MD for:  persistant dizziness or light-headedness   Complete by: As directed    Call MD for:  persistant nausea and vomiting   Complete by: As directed    Call MD for:  redness, tenderness, or signs of infection (pain, swelling,  redness, odor or green/yellow discharge around incision site)   Complete by: As directed    Call MD for:  severe uncontrolled pain   Complete by: As directed    Call MD for:  temperature >100.4   Complete by: As directed    Diet - low sodium heart healthy   Complete by: As directed    Diet consistency: Dysphagia 3 diet and thin liquids.   Discharge wound care:   Complete by: As directed    Pressure Injury 07/05/20 Hip Right Stage 2 -  Partial thickness loss of dermis presenting as a shallow open injury with a red, pink wound bed without slough.  Wound care   Comments: Wash right hip wound with NS and apply Aquacel then cover with 4x4 and foam dressing. Change daily   Increase activity slowly   Complete by: As directed        Medication List    STOP taking these medications   amLODipine-olmesartan 10-40 MG tablet Commonly known as: AZOR   lidocaine 5 % ointment Commonly known as: XYLOCAINE     TAKE these medications   acetaminophen 325 MG tablet Commonly known as: TYLENOL Take 2 tablets (650 mg total) by mouth every 6 (six) hours as needed for mild pain (or Fever >/= 101).   amLODipine 5 MG tablet Commonly known as: NORVASC Take 1 tablet (5 mg total) by mouth daily. Start taking on: July 11, 2020   aspirin 81 MG tablet Take 81 mg by mouth daily.   docusate sodium 100 MG capsule Commonly known as: COLACE Take 1 capsule (100 mg total) by mouth daily as needed for mild constipation.   feeding supplement Liqd Take 237 mLs by mouth 2 (two) times daily between meals. Start taking on: July 11, 2020   furosemide 40 MG tablet Commonly known as: LASIX Take 40 mg by mouth daily.   linaclotide 145 MCG Caps capsule Commonly known as: Linzess Take 1 capsule (145 mcg total) by mouth every Monday, Wednesday, and Friday.   metoprolol tartrate 25 MG tablet Commonly known as: LOPRESSOR Take 1 tablet (25 mg total) by mouth 2 (two) times daily.   multivitamin with  minerals Tabs tablet Take 1 tablet by mouth daily. Start taking on: July 11, 2020   polyethylene glycol powder 17 GM/SCOOP powder Commonly known as: MiraLax Take 17 g by mouth daily as needed for moderate constipation or severe constipation.  VSL#3 Caps Take 1 capsule by mouth daily.      No Known Allergies    Procedures/Studies: DG Chest 2 View  Result Date: 07/09/2020 CLINICAL DATA:  Pneumonia follow-up EXAM: CHEST - 2 VIEW COMPARISON:  07/05/2020 FINDINGS: Low lung volumes. Persistent right perihilar and basilar opacity. Small right pleural effusion. Decreased right basilar atelectasis. Stable heart size. IMPRESSION: Persistent right perihilar and basilar opacity. Similar small right pleural effusion. Decreased right basilar atelectasis. Electronically Signed   By: Macy Mis M.D.   On: 07/09/2020 09:05   DG Chest 2 View  Result Date: 07/05/2020 CLINICAL DATA:  Shortness of breath EXAM: CHEST - 2 VIEW COMPARISON:  June 29, 2020 FINDINGS: Degree of inspiration is shallow. There is elevation left hemidiaphragm, stable. There is a persistent right pleural effusion with atelectatic change and ill-defined airspace opacity in the right base. Left lung is clear. Heart size and pulmonary vascular normal. No adenopathy. There is aortic atherosclerosis. No appreciable bone lesions. IMPRESSION: Suspected small focus of pneumonia with atelectasis and effusion in the right base region, similar to recent study. No new opacity evident. Stable cardiac silhouette. No adenopathy evident by radiography. Aortic Atherosclerosis (ICD10-I70.0). Electronically Signed   By: Lowella Grip III M.D.   On: 07/05/2020 12:55      Subjective: Pleasantly confused.  Difficult historian.  Alert and oriented x2.  Denies dyspnea or pain.  States that he saw a baby in his bed earlier and started laughing.  As per RN, no acute issues and mildly confused at times without agitation.  Discharge  Exam:  Vitals:   07/10/20 0100 07/10/20 0506 07/10/20 0924 07/10/20 1145  BP:  124/83 110/73 127/89  Pulse:  78 80 80  Resp:    20  Temp: 98.2 F (36.8 C) 98 F (36.7 C)  (!) 97.5 F (36.4 C)  TempSrc: Oral Axillary  Oral  SpO2:   (!) 58% 100%  Weight:  84.4 kg    Height:        General: Pleasant elderly male, moderately built and nourished lying comfortably propped up in bed without distress.  On room air. Cardiovascular: S1 & S2 heard, RRR, S1/S2 +. No murmurs, rubs, gallops or clicks.  No JVD.  1+ bilateral leg edema, some of it appears chronic.  Telemetry personally reviewed: Sinus rhythm with occasional PVCs but has lot of tremor artifact. Respiratory: Slightly diminished breath sounds in the bases with occasional basal crackles but otherwise clear to auscultation without wheezing or rhonchi.  No increased work of breathing. Abdominal:  Non distended, non tender & soft. No organomegaly or masses appreciated. Normal bowel sounds heard. CNS: Alert and oriented x2. No focal deficits. Extremities: Moves all extremities symmetrically    The results of significant diagnostics from this hospitalization (including imaging, microbiology, ancillary and laboratory) are listed below for reference.     Microbiology: Recent Results (from the past 240 hour(s))  Urine culture     Status: Abnormal   Collection Time: 07/05/20  2:00 PM   Specimen: Urine, Catheterized  Result Value Ref Range Status   Specimen Description URINE, CATHETERIZED  Final   Special Requests NONE  Final   Culture (A)  Final    <10,000 COLONIES/mL INSIGNIFICANT GROWTH Performed at [facility], [address] [place], [place] [address]    Report Status 07/06/2020 FINAL  Final  Resp Panel by RT PCR (RSV, Flu A&B, Covid) - Nasopharyngeal Swab     Status: None   Collection Time: 07/05/20  2:11 PM   Specimen: Nasopharyngeal Swab  Result Value Ref Range Status   SARS Coronavirus 2 by RT PCR NEGATIVE NEGATIVE  Final    Comment: (NOTE) SARS-CoV-2 target nucleic acids are NOT DETECTED.  The SARS-CoV-2 RNA is generally detectable in upper respiratoy specimens during the acute phase of infection. The lowest concentration of SARS-CoV-2 viral copies this assay can detect is 131 copies/mL. A negative result does not preclude SARS-Cov-2 infection and should not be used as the sole basis for treatment or other patient management decisions. A negative result may occur with  improper specimen collection/handling, submission of specimen other than nasopharyngeal swab, presence of viral mutation(s) within the areas targeted by this assay, and inadequate number of viral copies (<131 copies/mL). A negative result must be combined with clinical observations, patient history, and epidemiological information. The expected result is Negative.  Fact Sheet for Patients:  PinkCheek.be  Fact Sheet for Healthcare Providers:  GravelBags.it  This test is no t yet approved or cleared by the [place] FDA and  has been authorized for detection and/or diagnosis of SARS-CoV-2 by FDA under an Emergency Use Authorization (EUA). This EUA will remain  in effect (meaning this test can be used) for the duration of the COVID-19 declaration under Section 564(b)(1) of the Act, 21 U.S.C. section 360bbb-3(b)(1), unless the authorization is terminated or revoked sooner.     Influenza A by PCR NEGATIVE NEGATIVE Final   Influenza B by PCR NEGATIVE NEGATIVE Final    Comment: (NOTE) The Xpert Xpress SARS-CoV-2/FLU/RSV assay is intended as an aid in  the diagnosis of influenza from Nasopharyngeal swab specimens and  should not be used as a sole basis for treatment. Nasal washings and  aspirates are unacceptable for Xpert Xpress SARS-CoV-2/FLU/RSV  testing.  Fact Sheet for Patients: PinkCheek.be  Fact Sheet for Healthcare  Providers: GravelBags.it  This test is not yet approved or cleared by the [place] FDA and  has been authorized for detection and/or diagnosis of SARS-CoV-2 by  FDA under an Emergency Use Authorization (EUA). This EUA will remain  in effect (meaning this test can be used) for the duration of the  Covid-19 declaration under Section 564(b)(1) of the Act, 21  U.S.C. section 360bbb-3(b)(1), unless the authorization is  terminated or revoked.    Respiratory Syncytial Virus by PCR NEGATIVE NEGATIVE Final    Comment: (NOTE) Fact Sheet for Patients: PinkCheek.be  Fact Sheet for Healthcare Providers: GravelBags.it  This test is not yet approved or cleared by the [place] FDA and  has been authorized for detection and/or diagnosis of SARS-CoV-2 by  FDA under an Emergency Use Authorization (EUA). This EUA will remain  in effect (meaning this test can be used) for the duration of the  COVID-19 declaration under Section 564(b)(1) of the Act, 21 U.S.C.  section 360bbb-3(b)(1), unless the authorization is terminated or  revoked. Performed at [facility], [place] [address], [place], [place] [address]   SARS Coronavirus 2 by RT PCR (hospital order, performed in [facility] hospital lab) Nasopharyngeal Nasopharyngeal Swab     Status: None   Collection Time: 07/09/20 10:23 AM   Specimen: Nasopharyngeal Swab  Result Value Ref Range Status   SARS Coronavirus 2 NEGATIVE NEGATIVE Final    Comment: (NOTE) SARS-CoV-2 target nucleic acids are NOT DETECTED.  The SARS-CoV-2 RNA is generally detectable in upper and lower respiratory specimens during the acute phase of infection. The lowest concentration of SARS-CoV-2 viral copies this assay can detect  is 250 copies / mL. A negative result does not preclude SARS-CoV-2 infection and should not be used as the sole basis for treatment or other patient  management decisions.  A negative result may occur with improper specimen collection / handling, submission of specimen other than nasopharyngeal swab, presence of viral mutation(s) within the areas targeted by this assay, and inadequate number of viral copies (<250 copies / mL). A negative result must be combined with clinical observations, patient history, and epidemiological information.  Fact Sheet for Patients:   StrictlyIdeas.no  Fact Sheet for Healthcare Providers: BankingDealers.co.za  This test is not yet approved or  cleared by the [place] FDA and has been authorized for detection and/or diagnosis of SARS-CoV-2 by FDA under an Emergency Use Authorization (EUA).  This EUA will remain in effect (meaning this test can be used) for the duration of the COVID-19 declaration under Section 564(b)(1) of the Act, 21 U.S.C. section 360bbb-3(b)(1), unless the authorization is terminated or revoked sooner.  Performed at [facility], [place] [address] [place], [place] [address]      Labs: CBC: Recent Labs  Lab 07/05/20 1225 07/06/20 0230 07/07/20 0321 07/09/20 0822 07/10/20 0346  WBC 6.7 7.7 9.0 6.5 5.2  NEUTROABS 5.1 6.6 7.6 5.0 3.8  HGB 9.6* 10.2* 9.3* 8.5* 8.8*  HCT 31.7* 33.6* 30.2* 28.8* 29.2*  MCV 80.3 78.3* 77.6* 80.2 80.7  PLT 159 161 139* 122* 134*    Basic Metabolic Panel: Recent Labs  Lab 07/06/20 0230 07/07/20 0321 07/08/20 0555 07/09/20 0822 07/10/20 0346  NA 140 139 140 140 139  K 4.8 4.6 5.0 4.1 4.6  CL 103 104 108 108 105  CO2 22 24 22 26 26   GLUCOSE 130* 100* 82 85 111*  BUN 39* 39* 34* 29* 30*  CREATININE 1.60* 1.41* 1.12 0.93 0.90  CALCIUM 9.3 8.7* 8.5* 8.8* 9.0  MG 2.0  --   --   --   --     Liver Function Tests: Recent Labs  Lab 07/05/20 1225 07/09/20 0822 07/10/20 0346  AST 30 30 31   ALT 16 27 26   ALKPHOS 53 58 59  BILITOT 1.3* 0.6 0.8  PROT 5.3* 5.0* 5.0*  ALBUMIN 2.7*  2.6* 2.6*    Urinalysis    Component Value Date/Time   COLORURINE AMBER (A) 07/05/2020 1225   APPEARANCEUR CLOUDY (A) 07/05/2020 1225   LABSPEC 1.017 07/05/2020 1225   PHURINE 5.0 07/05/2020 1225   GLUCOSEU NEGATIVE 07/05/2020 1225   HGBUR MODERATE (A) 07/05/2020 1225   BILIRUBINUR NEGATIVE 07/05/2020 1225   KETONESUR NEGATIVE 07/05/2020 1225   PROTEINUR 100 (A) 07/05/2020 1225   UROBILINOGEN 0.2 03/27/2011 1136   NITRITE NEGATIVE 07/05/2020 1225   LEUKOCYTESUR LARGE (A) 07/05/2020 1225    I discussed with patient's granddaughter via phone, updated care and answered questions.  Time coordinating discharge: 45 minutes  SIGNED:  Vernell Leep, MD, [place], [facility]. [facility]  To contact the attending provider between 7A-7P or the covering provider during after hours 7P-7A, please log into the web site [email or web address] and access using universal [place] password for that web site. If you do not have the password, please call the hospital operator.

## 2020-07-10 NOTE — TOC Progression Note (Signed)
Transition of Care [facility]) - Progression Note    Patient Details  Name: Geoffrey Frank MRN: [id number] Date of Birth: 1926-08-13  Transition of Care [facility]) CM/SW [place], [place] Phone Number: 07/10/2020, 10:31 AM  Clinical Narrative:    CSW spoke with pt's granddaughter Geoffrey Frank, presented offer of [facility], but [facility] can't take pt now. [place] recommended CSW try [facility], [facility] can't take pt. CSW reached out to [facility], they can't take pt today but also don't have any long term beds. CSW reached out [facility], they have short term SNF beds and also have long term beds, will review the referral and call CSW back. Auth started, ref #[id number], SNF choice needs to be updated when known.    Expected Discharge Plan: [place] Barriers to Discharge: Continued Medical Work up, Ship broker  Expected Discharge Plan and Services Expected Discharge Plan: [place] In-house Referral: Clinical Social Work     Living arrangements for the past 2 months: Single Family Home                                       Social Determinants of Health (SDOH) Interventions    Readmission Risk Interventions No flowsheet data found.

## 2020-07-10 NOTE — TOC Progression Note (Signed)
Transition of Care [facility]) - Progression Note    Patient Details  Name: Geoffrey Frank MRN: [id number] Date of Birth: [birth year]  [facility]) CM/SW Contact  Zenon Mayo, RN Phone Number: 07/10/2020, 2:07 PM  Clinical Narrative:    Per CSW we have auth for patient to go to [facility], covid was done yesterday, [facility] has a bed for patient, but the granddaughter needs to confirm if she wants him to go there.  NCM left message on vm and sent text message as well, has not heard from grand daughter.   Expected Discharge Plan: [place] Barriers to Discharge: Continued Medical Work up, Ship broker  Expected Discharge Plan and Services Expected Discharge Plan: [place] In-house Referral: Clinical Social Work     Living arrangements for the past 2 months: Single Family Home                                       Social Determinants of Health (SDOH) Interventions    Readmission Risk Interventions No flowsheet data found.

## 2020-07-10 NOTE — TOC Progression Note (Signed)
Transition of Care [facility]) - Progression Note    Patient Details  Name: PHILIP KOTLYAR MRN: [id number] Date of Birth: June 30, 1926  Transition of Care [facility]) CM/SW Contact  Zenon Mayo, RN Phone Number: 07/10/2020, 4:02 PM  Clinical Narrative:    Novella Olive , he has  About 40 in front of him , going to [facility] in [place].   Expected Discharge Plan: [place] Barriers to Discharge: Barriers Resolved  Expected Discharge Plan and Services Expected Discharge Plan: [place] In-house Referral: Clinical Social Work     Living arrangements for the past 2 months: Single Family Home Expected Discharge Date: 07/10/20                                     Social Determinants of Health (SDOH) Interventions    Readmission Risk Interventions No flowsheet data found.

## 2020-07-10 NOTE — TOC Progression Note (Signed)
Transition of Care [facility]) - Progression Note    Patient Details  Name: Geoffrey Frank MRN: [id number] Date of Birth: [birth year]  Transition of Care [facility]) CM/SW [place], [place] Phone Number: 07/10/2020, 3:33 PM  Clinical Narrative:    CSW spoke with [name] who stated [facility] was ok for her grandfather. CSW tried to reach back out to SNF, had to leave a vm for admissions rep, waiting on a return call.    Expected Discharge Plan: [place] Barriers to Discharge: Barriers Resolved  Expected Discharge Plan and Services Expected Discharge Plan: [place] In-house Referral: Clinical Social Work     Living arrangements for the past 2 months: Single Family Home                                       Social Determinants of Health (SDOH) Interventions    Readmission Risk Interventions No flowsheet data found.

## 2020-07-10 NOTE — Care Management Important Message (Signed)
Important Message  Patient Details  Name: MACLEAN FOISTER MRN: [id number] Date of Birth: 01-08-1926   Medicare Important Message Given:  Yes     Shelda Altes 07/10/2020, 12:27 PM

## 2020-07-10 NOTE — Discharge Instructions (Signed)

## 2020-07-10 NOTE — Progress Notes (Signed)
Third attempt to call report to receiving facility. No one picked up the call. Told the oncoming Designer, multimedia. Told PTAR that I was unsuccessful to give report. PTAR has arrived to receive patient. IV has been removed and tele monitor has been removed. Patient belongings (wallet, cell phone) with PTAR. Discharge summary and DNR form signed from MD are located in the packet.

## 2020-07-10 NOTE — NC FL2 (Signed)
[place] LEVEL OF CARE SCREENING TOOL     IDENTIFICATION  Patient Name: Geoffrey Frank Birthdate: 1926/02/12 Sex: male Admission Date (Current Location): 07/05/2020  [facility] and [place] Number:  Herbalist and Address:  [facility] [place], [place] [address] [place], [place] [address]      Provider Number: [id number]  Attending Physician Name and Address:  Modena Jansky, MD  Relative Name and Phone Number:  [name] [phone]    Current Level of Care: Hospital Recommended Level of Care: [place] Prior Approval Number:    Date Approved/Denied:   PASRR Number: [id number]  Discharge Plan: SNF    Current Diagnoses: Patient Active Problem List   Diagnosis Date Noted  . Malnutrition of moderate degree 07/08/2020  . CAP (community acquired pneumonia) 07/05/2020  . Pressure ulcer of sacral region, stage 2 ([place]) 07/05/2020  . AKI (acute kidney injury) ([place]) 07/05/2020  . PAD (peripheral artery disease) ([place]) 05/26/2020  . Cellulitis of right lower extremity 05/26/2020  . Chronic venous insufficiency 05/26/2020  . H/O falling 05/26/2020  . Benign hypertensive renal disease 05/18/2020  . Chronic renal disease, stage II 05/18/2020  . Chronic diastolic heart failure ([place]) 05/18/2020  . Cellulitis 05/18/2020  . SVT (supraventricular tachycardia) ([place]) 03/25/2016  . Epistaxis 03/08/2015  . Essential hypertension 02/01/2014  . Chest pain 02/01/2014  . Bladder cancer ([place]) 10/14/2011  . CHRONIC RHINITIS 09/17/2007  . COPD (chronic obstructive pulmonary disease) ([place]) 09/17/2007  . DEGENERATIVE DISC DISEASE 09/17/2007  . PROSTATE CANCER, HX OF 09/17/2007    Orientation RESPIRATION BLADDER Height & Weight     Place, Self  Normal Incontinent, External catheter Weight: 186 lb (84.4 kg) (pt is not able to stand safely for scale weight ) Height:  6' (182.9 cm)  BEHAVIORAL SYMPTOMS/MOOD NEUROLOGICAL BOWEL  NUTRITION STATUS      Continent Diet (see dc summary)  AMBULATORY STATUS COMMUNICATION OF NEEDS Skin   Total Care Verbally Other (Comment) (Pressure Injury 07/05/20 Hip Right Stage 2 partial thickness loss of dermis presenting as a shallow open injury with a red as a shallow open injury with a red pink wound bed without slough)                       Personal Care Assistance Level of Assistance  Bathing, Feeding, Dressing Bathing Assistance: Maximum assistance Feeding assistance: Limited assistance Dressing Assistance: Maximum assistance     Functional Limitations Info  Sight, Hearing, Speech Sight Info: Adequate Hearing Info: Adequate Speech Info: Adequate    SPECIAL CARE FACTORS FREQUENCY  PT (By licensed PT), OT (By licensed OT)     PT Frequency: 5x week OT Frequency: 5x week            Contractures Contractures Info: Present    Additional Factors Info  Code Status, Allergies Code Status Info: DNR Allergies Info: NKA           Current Medications (07/10/2020):  This is the current hospital active medication list Current Facility-Administered Medications  Medication Dose Route Frequency Provider Last Rate Last Admin  . amLODipine (NORVASC) tablet 5 mg  5 mg Oral Daily Karmen Bongo, MD   5 mg at 07/10/20 2536  . aspirin EC tablet 81 mg  81 mg Oral Daily Karmen Bongo, MD   81 mg at 07/10/20 6440  . cefdinir (OMNICEF) capsule 300 mg  300 mg Oral Q12H Nita Sells, MD   300 mg  at 07/10/20 0927  . docusate sodium (COLACE) capsule 100 mg  100 mg Oral Daily PRN Karmen Bongo, MD      . enoxaparin (LOVENOX) injection 40 mg  40 mg Subcutaneous Q24H Karmen Bongo, MD   40 mg at 07/09/20 2129  . feeding supplement (ENSURE ENLIVE / ENSURE PLUS) liquid 237 mL  237 mL Oral BID BM Bonnell Public Tublu, MD   237 mL at 07/10/20 0934  . LORazepam (ATIVAN) injection 0.5 mg  0.5 mg Intravenous Once Chotiner, Yevonne Aline, MD      . metoprolol tartrate  (LOPRESSOR) tablet 25 mg  25 mg Oral BID Karmen Bongo, MD   25 mg at 07/10/20 0927  . multivitamin with minerals tablet 1 tablet  1 tablet Oral Daily Vashti Hey, MD   1 tablet at 07/10/20 0927  . polyethylene glycol powder (GLYCOLAX/MIRALAX) container 17 g  17 g Oral Daily PRN Karmen Bongo, MD         Discharge Medications: Please see discharge summary for a list of discharge medications.  Relevant Imaging Results:  Relevant Lab Results:   Additional Information SSN [id number]  Loreta Ave, LCSWA

## 2020-07-10 NOTE — TOC Transition Note (Signed)
Transition of Care [facility]) - CM/SW Discharge Note   Patient Details  Name: ARTIE MCINTYRE MRN: [id number] Date of Birth: Nov 26, 1925  Transition of Care [facility]) CM/SW Contact:  Loreta Ave, [place] Phone Number: 07/10/2020, 4:03 PM   Clinical Narrative:    Patient will DC to: [facility] Anticipated DC date: 07/10/20 Family notified: [name] Transport by: Corey Antero   Per MD patient ready for DC to [facility]. RN to call report prior to discharge [phone]. RN, patient, patient's family, and facility notified of DC. Discharge Summary and FL2 sent to facility. DC packet on chart. Ambulance transport requested for patient.   CSW will sign off for now as social work intervention is no longer needed. Please consult [place] again if new needs arise.     Final next level of care: Skilled Nursing Facility Barriers to Discharge: Barriers Resolved   Patient Goals and CMS Choice Patient states their goals for this hospitalization and ongoing recovery are:: Get stronger soon CMS Medicare.gov Compare Post Acute Care list provided to:: Patient Represenative (must comment) Choice offered to / list presented to : Adult Children [name])  Discharge Placement PASRR number recieved: 05/22/20            Patient [facility] bed at: [facility] Patient to be transferred to facility by: [place] Name of family member notified: [name] Patient and family notified of of transfer: 07/10/20  Discharge Plan and Services In-house Referral: Clinical Social Work                                   Social Determinants of Health (SDOH) Interventions     Readmission Risk Interventions No flowsheet data found.

## 2020-07-10 NOTE — Progress Notes (Signed)
Second attempt to call report to receiving facility. Spoke to a receptionist, transfer was unsuccessful and mailbox extension was not set up. Will try again before end of shift.

## 2020-07-10 NOTE — Progress Notes (Signed)
Attempted to call report on [facility]. NS responded that the RN is in a patient's room currently and to call back in 10 mins.

## 2020-07-12 ENCOUNTER — Telehealth: Payer: Self-pay

## 2020-07-12 NOTE — Telephone Encounter (Signed)
I called to do a transition of care call and the pt said that he was discharged from [facility] and transferred to the hospital in [place].  The pt said that he was admitted in the hospital last Sunday morning.

## 2020-07-12 NOTE — Telephone Encounter (Signed)
He is in skilled nursing, not hospital. thx

## 2020-07-13 ENCOUNTER — Ambulatory Visit: Payer: Medicare PPO

## 2020-07-13 DIAGNOSIS — Z23 Encounter for immunization: Secondary | ICD-10-CM | POA: Diagnosis not present

## 2020-07-13 DIAGNOSIS — I10 Essential (primary) hypertension: Secondary | ICD-10-CM

## 2020-07-13 DIAGNOSIS — J449 Chronic obstructive pulmonary disease, unspecified: Secondary | ICD-10-CM

## 2020-07-13 NOTE — Chronic Care Management (AMB) (Signed)
  Chronic Care Management   Outreach Note  07/13/2020 Name: IAIN SAWCHUK MRN: [id number] DOB: [birth year]  Referred by: [place] Chard, MD Reason for referral : Care Coordination   SW placed an unsuccessful outbound call to the patient to assess how he is doing in rehab. SW placed a successful outbound call to [employer] at [facility]. Determined the patient is doing well in therapy at this time. Olegario Shearer reports she has yet to speak with the patient about plans for long-term care versus returning home but plans to discuss this "tomorrow".  Discussed barriers to patient returning home including high fall risk and limited access to a caregiver.   Follow Up Plan: SW will follow up with Olegario Shearer over the next 10 days to assess outcome of discussion regarding long term care planning.  [name], BSW, CDP Social Worker, Certified Dementia Practitioner [place] / [place] Management [phone]

## 2020-07-17 DIAGNOSIS — J189 Pneumonia, unspecified organism: Secondary | ICD-10-CM | POA: Diagnosis not present

## 2020-07-18 ENCOUNTER — Telehealth: Payer: Self-pay

## 2020-07-18 ENCOUNTER — Telehealth: Payer: Medicare PPO

## 2020-07-18 NOTE — Telephone Encounter (Signed)
  Chronic Care Management   Outreach Note  07/18/2020 Name: Geoffrey Frank MRN: [id number] DOB: 02-May-1926  Referred by: [place] Chard, MD Reason for referral : Care Coordination   SW placed an unsuccessful outbound call to [place] Work Director, Chancy Milroy to follow up on patient progress in rehab. SW left a HIPAA compliant voice message requesting a return call to determine long-term care plans for the patient.  Follow Up Plan: The care management team will reach out to the patient again over the next 10 days.   [name], BSW, CDP Social Worker, Certified Dementia Practitioner [place] / [place] Management [phone]

## 2020-07-24 DIAGNOSIS — I5032 Chronic diastolic (congestive) heart failure: Secondary | ICD-10-CM | POA: Diagnosis not present

## 2020-07-24 DIAGNOSIS — N182 Chronic kidney disease, stage 2 (mild): Secondary | ICD-10-CM | POA: Diagnosis not present

## 2020-07-25 ENCOUNTER — Telehealth: Payer: Medicare PPO

## 2020-07-25 ENCOUNTER — Telehealth: Payer: Self-pay

## 2020-07-25 NOTE — Telephone Encounter (Signed)
°  Chronic Care Management   Outreach Note  07/25/2020 Name: NEIKO TRIVEDI MRN: [id number] DOB: Jan 18, 1926  Referred by: [place] Chard, MD Reason for referral : Care Coordination   Second unsuccessful outbound call placed to [facility] with [facility] to request update on patient long term care planning. SW left a voice message requesting a return call.  Follow Up Plan: The care management team will reach out to the patient again over the next 14 days.   [name], BSW, CDP Social Worker, Certified Dementia Practitioner [place] / [place] Management [phone]

## 2020-07-27 ENCOUNTER — Encounter: Payer: Self-pay | Admitting: Internal Medicine

## 2020-07-27 ENCOUNTER — Ambulatory Visit (INDEPENDENT_AMBULATORY_CARE_PROVIDER_SITE_OTHER): Payer: Medicare PPO | Admitting: Internal Medicine

## 2020-07-27 VITALS — BP 130/80 | HR 90 | Ht 72.0 in | Wt 176.0 lb

## 2020-07-27 DIAGNOSIS — R600 Localized edema: Secondary | ICD-10-CM | POA: Diagnosis not present

## 2020-07-27 DIAGNOSIS — I5032 Chronic diastolic (congestive) heart failure: Secondary | ICD-10-CM

## 2020-07-27 DIAGNOSIS — J189 Pneumonia, unspecified organism: Secondary | ICD-10-CM | POA: Diagnosis not present

## 2020-07-27 DIAGNOSIS — I10 Essential (primary) hypertension: Secondary | ICD-10-CM
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
## 2020-07-27 NOTE — Patient Instructions (Signed)

## 2020-07-28 ENCOUNTER — Telehealth: Payer: Self-pay

## 2020-07-28 DIAGNOSIS — R4182 Altered mental status, unspecified: Secondary | ICD-10-CM | POA: Diagnosis not present

## 2020-07-28 DIAGNOSIS — M2550 Pain in unspecified joint: Secondary | ICD-10-CM | POA: Diagnosis not present

## 2020-07-28 DIAGNOSIS — Z7401 Bed confinement status: Secondary | ICD-10-CM | POA: Diagnosis not present

## 2020-07-28 NOTE — Telephone Encounter (Signed)
I returned Geoffrey Frank' call, she wanted to know the name of the pt's urologist and oncologist.  She was given the name of the urologist, Dr. Dereck Leep at [facility] and told that I would send a message to the referral coordinator to see if she can help with finding the name of the pt's urologist because I'm having trouble finding it.  [name] said that the rx for the hospital bed is no longer needed because the bed has been delivered already.

## 2020-08-02 ENCOUNTER — Telehealth: Payer: Self-pay

## 2020-08-02 ENCOUNTER — Telehealth: Payer: Medicare PPO

## 2020-08-02 NOTE — Telephone Encounter (Signed)
  Chronic Care Management   Outreach Note  08/02/2020 Name: Geoffrey Frank MRN: [id number] DOB: [birth year]  Referred by: [place] Chard, MD Reason for referral : Care Coordination   An unsuccessful telephone outreach was attempted today. The patient was referred to the case management team for assistance with care management and care coordination.   Follow Up Plan: The care management team will reach out to the patient again over the next 21 days.   [name], BSW, CDP Social Worker, Certified Dementia Practitioner [place] / [place] Management [phone]

## 2020-08-07 ENCOUNTER — Ambulatory Visit: Payer: Self-pay

## 2020-08-07 ENCOUNTER — Other Ambulatory Visit: Payer: Self-pay

## 2020-08-07 ENCOUNTER — Telehealth: Payer: Medicare PPO

## 2020-08-07 DIAGNOSIS — N182 Chronic kidney disease, stage 2 (mild): Secondary | ICD-10-CM

## 2020-08-07 DIAGNOSIS — J449 Chronic obstructive pulmonary disease, unspecified: Secondary | ICD-10-CM

## 2020-08-07 DIAGNOSIS — I10 Essential (primary) hypertension: Secondary | ICD-10-CM

## 2020-08-09 NOTE — Patient Instructions (Signed)
Visit Information  Goals Addressed              This Visit's Progress     Patient Stated   .  COMPLETED: "I need help at home but don't think I can qualify" (pt-stated)        Current Barriers:  [name] Knowledge Deficits related to eligibility for VA benefits to assist with caregiver/custodial care . Chronic Disease Management support and education needs related to CKD, HTN, COPD, Impaired Physical Mobility and Poor Gait . Lacks caregiver support.  . Self Care Deficit  Nurse Case Manager Clinical Goal(s):  [name] Over the next 30 days, patient will work with the [employer] [place] office to address needs related to [place] benefits including caregiver and or custodial care assistance  Goal Not Met  . New - 08/23/19 - Over the next 90 days, patient will maintain his ability to perform Self Care as evidence by patient will be able to continue completing his ADL's/IADL's, self-administering his medications and independently performing all other aspects of personal care without difficulty  CCM RN CM Interventions:   call completed with granddaughter [name] . Determined patient was placed under [facility] approximately 1 week ago . Determined patient is showing signs of end of life and his expected time of death is 1-2 days from now . Provided active listening to patient's granddaughter and discussed grievance counseling is available via Hospice . Provided my contact information and encouraged a call if needed upon patient's passing  . Discussed patient will be closed from the CCM program due to being followed by Hospice   Patient Self Care Activities:  . Self administers medications as prescribed . Attends all scheduled provider appointments . Calls pharmacy for medication refills . Performs ADL's independently . Performs IADL's independently . Calls provider office for new concerns or questions  Please see past updates related to this goal by clicking on the "Past  Updates" button in the selected goal      .  COMPLETED: "I need to know when my hospital bed will arrive" (pt-stated)        [place] (see longitudinal plan of care for additional care plan information)  Current Barriers:  . Care Coordination needs related to DME in a patient with new onset of Community Acquired-Pneumonia . 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 Lacks caregiver support.   Nurse Case Manager Clinical Goal(s):  [name] Over the next 30 days, patient will work with health care team  to address needs related to Care Coordination for DME needs  CCM RN CM Interventions:   call completed with granddaughter [name] . Determined patient was placed under [facility]ospice Care approximately 1 week ago . Determined patient is showing signs of end of life and his expected time of death is 1-2 days from now . Provided active listening to patient's granddaughter and discussed grievance counseling is available via Hospice . Provided my contact information and encouraged a call if needed upon patient's passing  . Discussed patient will be closed from the CCM program due to being followed by Hospice   Patient Self Care Activities:  . Self administers medications as prescribed . Attends all scheduled provider appointments . Calls pharmacy for medication refills . Calls provider office for new concerns or questions  Initial goal documentation     .  COMPLETED: "I need to sign up for the COVID vaccine" (pt-stated)  Current Barriers:  [name] Knowledge Deficits related to registering and receiving 1st COVID 19 vaccine . Chronic Disease Management support and education needs related to COVID vaccine, COPD, HTN, CKDII  Nurse Case Manager Clinical Goal(s):  [name] Over the next 30 days, patient will verbalize understanding of plan for scheduling and receiving COVID 19 vaccinations  CCM RN CM Interventions:   call completed  with granddaughter [name] . Determined patient was placed under [facility]ospice Care approximately 1 week ago . Determined patient is showing signs of end of life and his expected time of death is 1-2 days from now . Provided active listening to patient's granddaughter and discussed grievance counseling is available via Hospice . Provided my contact information and encouraged a call if needed upon patient's passing  . Discussed patient will be closed from the CCM program due to being followed by Hospice   Patient Self Care Activities:  . Self administers medications as prescribed . Attends all scheduled provider appointments . Calls pharmacy for medication refills . Performs ADL's independently . Performs IADL's independently . Calls provider office for new concerns or questions  Initial goal documentation     .  COMPLETED: "to get my CHF under control" (pt-stated)        CARE PLAN ENTRY (see longitudinal plan of care for additional care plan information)  Current Barriers:  [name] Knowledge Deficits related to disease process and Self Health management of CHF  . 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 Determined patient was placed under Hospice Care approximately 1 week ago . Determined patient is showing signs of end of life and his expected time of death is 1-2 days from now . Provided active listening to patient's granddaughter and discussed grievance counseling is available via Hospice . Provided my contact information and encouraged a call if needed upon patient's passing  . Discussed patient will be closed from the CCM program due to being  followed by Hospice   Patient Self Care Activities:  . Patient verbalizes understanding of plan to contact the CCM team and or MD to report new or worsening symptoms of CHF, promptly and or to call 911 if urgent medical care is needed . Self administers medications as prescribed . Attends all scheduled provider appointments . Calls pharmacy for medication refills . Calls provider office for new concerns or questions  Initial goal documentation     .  COMPLETED: "to keep my BP within normal range" (pt-stated)        Current Barriers:  [name] Knowledge Deficits related to disease process and Self Health Management of HTN . Chronic Disease Management support and education needs related to HTN, COPD, CKDII  Nurse Case Manager Clinical Goal(s):  [name] Over the next 90 days, patient will work with CCM RN CM and PCP to address needs related to disease education and support of HTN  CCM RN CM Interventions:   call completed with granddaughter [employer] . Determined patient was placed under Hospice Care approximately 1 week ago . Determined patient is showing signs of end of life and his expected time of death is 1-2 days from now . Provided active listening  to patient's granddaughter and discussed grievance counseling is available via Hospice . Provided my contact information and encouraged a call if needed upon patient's passing  . Discussed patient will be closed from the CCM program due to being followed by Hospice   Patient Self Care Activities:  . Self administers medications as prescribed . Attends all scheduled provider appointments . Calls pharmacy for medication refills . Performs ADL's independently . Performs IADL's independently . Calls provider office for new concerns or questions  Initial goal documentation     .  COMPLETED: "to keep my COPD under good control" (pt-stated)        Current Barriers:  [name] Knowledge Deficits related to disease process and Self health  management of COPD . Chronic Disease Management support and education needs related to COPD, HTN, CKDII  Nurse Case Manager Clinical Goal(s):  [name] Over the next 90 days, patient will work with the [place] CM and PCP to address needs related to disease education and treatment management of COPD  CCM RN CM Interventions:   call completed with granddaughter [employer] . Determined patient was placed under Hospice Care approximately 1 week ago . Determined patient is showing signs of end of life and his expected time of death is 1-2 days from now . Provided active listening to patient's granddaughter and discussed grievance counseling is available via Hospice . Provided my contact information and encouraged a call if needed upon patient's passing  . Discussed patient will be closed from the CCM program due to being followed by Hospice   Patient Self Care Activities:  . Self administers medications as prescribed . Attends all scheduled provider appointments . Calls pharmacy for medication refills . Performs ADL's independently . Performs IADL's independently . Calls provider office for new concerns or questions  Initial goal documentation       Other   .  COMPLETED: Collaborate with RN Care Manager to perform appropriate assessments to assist with care coordination needs        CARE PLAN ENTRY (see longitudinal plan of care for additional care plan information)  Current Barriers:  . ADL IADL limitations . Social Isolation . Limited access to caregiver . Recent inpatient stay from 05/18/20 - 05/22/20 due to Cellulitis which resulted in a SNF stay for Rehab from 05/22/20 - 06/13/20 . Chronic conditions including COPD, HTN, and CKD II which put patient at an increased risk for hospitalization  Social Work Clinical Goal(s):  [name] Over the next 90 days the patient will work with SW to address ongoing care coordination needs post SNF discharge  CCM SW Interventions: Completed  06/21/20 . Collaboration with Minette Brine FNP who requests SW assist with placement for long term care o Reviewed the patient is still cognitive and able to refuse placement if not desired . Successful outbound call placed to the patient to assess care coordination needs o The patient reports his grand-daughter continues to stay in his home to assist with patient care needs . Discussed opportunity for SW to assist with placement into long term care facility o Patient declined placement at this time stating he wants to remain home . Collaboration with Minette Brine FNP to inform of patient decision to remain in the home . Scheduled follow up call to the patient over the next three weeks  o Encouraged the patient to contact SW directly if assistance is needed prior to next scheduled call  Completed 06/15/20 . Successful outbound call placed to the patient to confirm  contact with home health o The patient reports his son was contacted and advised someone would visit the patients home today for start of care . Scheduled follow up call over the next week . Advised the patient to contact SW if assistance is needed prior to follow up call  Completed 06/14/20 . Inter-disciplinary care team collaboration (see longitudinal plan of care) . Performed chart review to note health plan issued a notice indicating SNF days would no longer be covered by health plan . Successful outbound call placed to the patient to assess care coordination needs o Patient reports he returned home late afternoon of 10/5 o Patients grand-daughter is staying in the home to assist with care needs at this time  o Patient reports he has a niece in [place] who may come assist with patient care in the home if needed . Discussed patient referral to [facility] for Pt, OT, RN, and aid.  o Patient reports he has not yet been contacted to schedule start of care o Advised the patient SW will outreach on 06/15/20 to confirm contacted by  Alvis Lemmings - If the patient has yet to receive a call, SW will collaborate with Alvis Lemmings to confirm plans for start of care . Collaboration with Larena Glassman RN with [facility]  o Confirmed [facility] is aware of patients return home and would be receiving a visit by the RN by end of business today (4/6) o Discussed Larena Glassman is no longer the assigned RN as assignments have been altered based on service zip codes . Collaboration with [facility] to inform of patients current disposition and plan  CCM RN CM Interventions   call completed with granddaughter [name] . Determined patient was placed under [facility]osp[facility] Care approximately 1 week ago . Determined patient is showing signs of end of life and his expected time of death is 1-2 days from now . Provided active listening to patient's granddaughter and discussed grievance counseling is available via Hospice . Provided my contact information and encouraged a call if needed upon patient's passing  . Discussed patient will be closed from the CCM program due to being followed by Hospice   Patient Self Care Activities:  . Self administers medications as prescribed . Attends all scheduled provider appointments . Calls provider office for new concerns or questions . Supportive family to assist with patient care needs  Please see past updates related to this goal by clicking on the "Past Updates" button in the selected goal         The patient verbalized understanding of instructions, educational materials, and care plan provided today and declined offer to receive copy of patient instructions, educational materials, and care plan.   No further follow up required  [name], RN, BSN, CCM Care Management Coordinator [facility]  Direct Phone: [phone]

## 2020-08-09 NOTE — Chronic Care Management (AMB) (Signed)
Chronic Care Management   Follow Up Note    Name: Geoffrey Frank MRN: [id number] DOB: 08/01/1926  Referred by: Sanders, Robyn, MD Reason for referral : Chronic Care Management (CCM RNCM FU Call )   Geoffrey Frank is a 86 y.o. year old male who is a primary care patient of Sanders, Robyn, MD. The CCM team was consulted for assistance with chronic disease management and care coordination needs.    Review of patient status, including review of consultants reports, relevant laboratory and other test results, and collaboration with appropriate care team members and the patient's provider was performed as part of comprehensive patient evaluation and provision of chronic care management services.    SDOH (Social Determinants of Health) assessments performed: No See Care Plan activities for detailed interventions related to SDOH)   Placed outbound call to patient's granddaughter Geoffrey Frank for a CCM RN CM follow up.    Outpatient Encounter Medications as of   Medication Sig  . acetaminophen (TYLENOL) 325 MG tablet Take 2 tablets (650 mg total) by mouth every 6 (six) hours as needed for mild pain (or Fever >/= 101).  . amLODipine (NORVASC) 5 MG tablet Take 1 tablet (5 mg total) by mouth daily.  . aspirin 81 MG tablet Take 81 mg by mouth daily.  . docusate sodium (COLACE) 100 MG capsule Take 1 capsule (100 mg total) by mouth daily as needed for mild constipation.  . feeding supplement (ENSURE ENLIVE / ENSURE PLUS) LIQD Take 237 mLs by mouth 2 (two) times daily between meals.  . furosemide (LASIX) 40 MG tablet Take 40 mg by mouth daily.  . linaclotide (LINZESS) 145 MCG CAPS capsule Take 1 capsule (145 mcg total) by mouth every Monday, Wednesday, and Friday.  . metoprolol tartrate (LOPRESSOR) 25 MG tablet Take 1 tablet (25 mg total) by mouth 2 (two) times daily.  . Multiple Vitamin (MULTIVITAMIN WITH MINERALS) TABS tablet Take 1 tablet by mouth daily.  . polyethylene  glycol powder (MIRALAX) 17 GM/SCOOP powder Take 17 g by mouth daily as needed for moderate constipation or severe constipation.  . potassium chloride SA (KLOR-CON) 20 MEQ tablet Take 20 mEq by mouth 2 (two) times daily.  . Probiotic Product (VSL#3) CAPS Take 1 capsule by mouth daily.    No facility-administered encounter medications on file as of .     Objective:  Lab Results  Component Value Date   HGBA1C 5.9 (H) 06/25/2018   HGBA1C 5.9 (H) 09/29/2017   HGBA1C 5.9 (H) 10/22/2016   Lab Results  Component Value Date   CREATININE 0.90 07/10/2020   BP Readings from Last 3 Encounters:  07/27/20 130/80  07/10/20 127/89  06/29/20 139/89    Goals Addressed      Patient Stated   .  COMPLETED: "I need help at home but don't think I can qualify" (pt-stated)        Current Barriers:  . Knowledge Deficits related to eligibility for VA benefits to assist with caregiver/custodial care . Chronic Disease Management support and education needs related to CKD, HTN, COPD, Impaired Physical Mobility and Poor Gait . Lacks caregiver support.  . Self Care Deficit  Nurse Case Manager Clinical Goal(s):  . Over the next 30 days, patient will work with the [facility] Affairs Service Administrator office to address needs related to VA benefits including caregiver and or custodial care assistance  Goal Not Met  . New - 08/23/19 - Over the next 90 days, patient will maintain his ability   to perform Self Care as evidence by patient will be able to continue completing his ADL's/IADL's, self-administering his medications and independently performing all other aspects of personal care without difficulty  CCM RN CM Interventions:   call completed with granddaughter Geoffrey Frank . Determined patient was placed under [facility] approximately 1 week ago . Determined patient is showing signs of end of life and his expected time of death is 1-2 days from now . Provided active listening to patient's  granddaughter and discussed grievance counseling is available via Hospice . Provided my contact information and encouraged a call if needed upon patient's passing  . Discussed patient will be closed from the CCM program due to being followed by Hospice   Patient Self Care Activities:  . Self administers medications as prescribed . Attends all scheduled provider appointments . Calls pharmacy for medication refills . Performs ADL's independently . Performs IADL's independently . Calls provider office for new concerns or questions  Please see past updates related to this goal by clicking on the "Past Updates" button in the selected goal      .  COMPLETED: "I need to know when my hospital bed will arrive" (pt-stated)        CARE PLAN ENTRY (see longitudinal plan of care for additional care plan information)  Current Barriers:  . Care Coordination needs related to DME in a patient with new onset of Community Acquired-Pneumonia . 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 Lacks caregiver support.   Nurse Case Manager Clinical Goal(s):  . Over the next 30 days, patient will work with health care team  to address needs related to Care Coordination for DME needs  CCM RN CM Interventions:   call completed with granddaughter Geoffrey Frank . Determined patient was placed under [facility] Care approximately 1 week ago . Determined patient is showing signs of end of life and his expected time of death is 1-2 days from now . Provided active listening to patient's granddaughter and discussed grievance counseling is available via Hospice . Provided my contact information and encouraged a call if needed upon patient's passing  . Discussed patient will be closed from the CCM program due to being followed by Hospice   Patient Self Care Activities:  . Self administers medications as prescribed . Attends all scheduled provider appointments . Calls  pharmacy for medication refills . Calls provider office for new concerns or questions  Initial goal documentation     .  COMPLETED: "I need to sign up for the COVID vaccine" (pt-stated)        Current Barriers:  . Knowledge Deficits related to registering and receiving 1st COVID 19 vaccine . Chronic Disease Management support and education needs related to COVID vaccine, COPD, HTN, CKDII  Nurse Case Manager Clinical Goal(s):  . Over the next 30 days, patient will verbalize understanding of plan for scheduling and receiving COVID 19 vaccinations  CCM RN CM Interventions:   call completed with granddaughter Geoffrey Frank . Determined patient was placed under [facility] Care approximately 1 week ago . Determined patient is showing signs of end of life and his expected time of death is 1-2 days from now . Provided active listening to patient's granddaughter and discussed grievance counseling is available via Hospice . Provided my contact information and encouraged a call if needed upon patient's passing  . Discussed patient will be closed from the CCM program due to being followed by Hospice     Patient Self Care Activities:  . Self administers medications as prescribed . Attends all scheduled provider appointments . Calls pharmacy for medication refills . Performs ADL's independently . Performs IADL's independently . Calls provider office for new concerns or questions  Initial goal documentation     .  COMPLETED: "to get my CHF under control" (pt-stated)        CARE PLAN ENTRY (see longitudinal plan of care for additional care plan information)  Current Barriers:  [name] Knowledge Deficits related to disease process and Self Health management of CHF  . 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 Determined patient was placed under [facility]osp[facility] Care approximately 1 week ago . Determined patient is showing signs of end of life and his expected time of death is 1-2 days from now . Provided active listening to patient's granddaughter and discussed grievance counseling is available via Hospice . Provided my contact information and encouraged a call if needed upon patient's passing  . Discussed patient will be closed from the CCM program due to being followed by Hospice   Patient Self Care Activities:  . Patient verbalizes understanding of plan to contact the CCM team and or MD to report new or worsening symptoms of CHF, promptly and or to call 911 if urgent medical care is needed . Self administers medications as prescribed . Attends all scheduled provider appointments . Calls pharmacy for medication refills . Calls provider office for new concerns or questions  Initial goal documentation     .  COMPLETED: "to keep my BP within normal range" (pt-stated)        Current Barriers:  [name] Knowledge Deficits related to disease process and Self Health Management of HTN . Chronic Disease Management support and education needs related to HTN, COPD, CKDII  Nurse Case Manager Clinical Goal(s):  [name] Over the next 90 days, patient will work with CCM RN CM and PCP to address needs related to disease education and support of HTN  CCM RN CM Interventions:   call completed with granddaughter Geoffrey Frank . Determined patient was placed under [facility]osp[facility] Care approximately 1 week ago . Determined patient is showing signs of end of life and his expected time of death is 1-2 days from now . Provided active listening to patient's granddaughter and discussed grievance counseling is available via Hospice . Provided my contact information and encouraged  a call if needed upon patient's passing  . Discussed patient will be closed from the CCM program due to being followed by Hospice   Patient Self Care Activities:  . Self administers medications as prescribed . Attends all scheduled provider appointments . Calls pharmacy for medication refills . Performs ADL's independently . Performs IADL's independently . Calls provider office for new concerns or questions  Initial goal documentation     .  COMPLETED: "to keep my COPD under good control" (pt-stated)        Current Barriers:  [name] Knowledge Deficits related to disease process and Self health management of COPD . Chronic Disease Management support and education needs related to COPD, HTN, CKDII  Nurse Case Manager Clinical Goal(s):  [name] Over the next  90 days, patient will work with the [facility] CM and PCP to address needs related to disease education and treatment management of COPD  CCM RN CM Interventions:   call completed with granddaughter Geoffrey Frank . Determined patient was placed under [facility]osp[facility] Care approximately 1 week ago . Determined patient is showing signs of end of life and his expected time of death is 1-2 days from now . Provided active listening to patient's granddaughter and discussed grievance counseling is available via Hospice . Provided my contact information and encouraged a call if needed upon patient's passing  . Discussed patient will be closed from the CCM program due to being followed by Hospice   Patient Self Care Activities:  . Self administers medications as prescribed . Attends all scheduled provider appointments . Calls pharmacy for medication refills . Performs ADL's independently . Performs IADL's independently . Calls provider office for new concerns or questions  Initial goal documentation       Other   .  COMPLETED: Collaborate with RN Care Manager to perform appropriate assessments to assist with care coordination needs        CARE  PLAN ENTRY (see longitudinal plan of care for additional care plan information)  Current Barriers:  . ADL IADL limitations . Social Isolation . Limited access to caregiver . Recent inpatient stay from 05/18/20 - 05/22/20 due to Cellulitis which resulted in a SNF stay for Rehab from 05/22/20 - 06/13/20 . Chronic conditions including COPD, HTN, and CKD II which put patient at an increased risk for hospitalization  Social Work Clinical Goal(s):  [name] Over the next 90 days the patient will work with SW to address ongoing care coordination needs post SNF discharge  CCM SW Interventions: Completed 06/21/20 . Collaboration with Geoffrey Brine FNP who requests SW assist with placement for long term care o Reviewed the patient is still cognitive and able to refuse placement if not desired . Successful outbound call placed to the patient to assess care coordination needs o The patient reports his grand-daughter continues to stay in his home to assist with patient care needs . Discussed opportunity for SW to assist with placement into long term care facility o Patient declined placement at this time stating he wants to remain home . Collaboration with Geoffrey Brine FNP to inform of patient decision to remain in the home . Scheduled follow up call to the patient over the next three weeks  o Encouraged the patient to contact SW directly if assistance is needed prior to next scheduled call  Completed 06/15/20 . Successful outbound call placed to the patient to confirm contact with home health o The patient reports his son was contacted and advised someone would visit the patients home today for start of care . Scheduled follow up call over the next week . Advised the patient to contact SW if assistance is needed prior to follow up call  Completed 06/14/20 . Inter-disciplinary care team collaboration (see longitudinal plan of care) . Performed chart review to note health plan issued a notice indicating SNF days  would no longer be covered by health plan . Successful outbound call placed to the patient to assess care coordination needs o Patient reports he returned home late afternoon of 10/5 o Patients grand-daughter is staying in the home to assist with care needs at this time  o Patient reports he has a niece in [place] who may come assist with patient care in the home if needed . Discussed patient referral  to [facility] for Pt, OT, RN, and aid.  o Patient reports he has not yet been contacted to schedule start of care o Advised the patient SW will outreach on 06/15/20 to confirm contacted by Alvis Lemmings - If the patient has yet to receive a call, SW will collaborate with Alvis Lemmings to confirm plans for start of care . Collaboration with Larena Glassman RN with [facility]  o Confirmed [facility] is aware of patients return home and would be receiving a visit by the RN by end of business today (4/6) o Discussed Larena Glassman is no longer the assigned RN as assignments have been altered based on service zip codes . Collaboration with [facility] to inform of patients current disposition and plan  CCM RN CM Interventions   call completed with granddaughter Geoffrey Frank . Determined patient was placed under [facility]osp[facility] Care approximately 1 week ago . Determined patient is showing signs of end of life and his expected time of death is 1-2 days from now . Provided active listening to patient's granddaughter and discussed grievance counseling is available via Hospice . Provided my contact information and encouraged a call if needed upon patient's passing  . Discussed patient will be closed from the CCM program due to being followed by Hospice   Patient Self Care Activities:  . Self administers medications as prescribed . Attends all scheduled provider appointments . Calls provider office for new concerns or questions . Supportive family to assist with patient care needs  Please see past updates  related to this goal by clicking on the "Past Updates" button in the selected goal        Plan:   No further follow up required  [name], RN, BSN, CCM Care Management Coordinator [facility]  Direct Phone: [phone]

## 2020-08-09 DEATH — deceased

## 2020-08-23 ENCOUNTER — Telehealth: Payer: Medicare PPO

## 2020-09-20 ENCOUNTER — Ambulatory Visit: Payer: Medicare PPO | Admitting: Internal Medicine

## 2020-11-15 ENCOUNTER — Ambulatory Visit: Payer: Medicare PPO

## 2020-11-15 ENCOUNTER — Ambulatory Visit: Payer: Medicare PPO | Admitting: Internal Medicine

## 2021-05-24 ENCOUNTER — Encounter: Payer: Medicare PPO | Admitting: Internal Medicine

## 2021-11-15 IMAGING — CT CT EXTREM LOW BILAT W/ CM
2 of 3 series · 16 of 46 positions shown, 18 images · IV contrast (agent unspecified)
Comparison: None.

CONTRAST:  100mL OMNIPAQUE IOHEXOL 300 MG/ML  SOLN

CLINICAL DATA: Soft tissue infection

EXAM:
CT OF THE LOWER BILATERAL EXTREMITY WITH CONTRAST
TECHNIQUE: Multidetector CT imaging of the lower bilateral extremity was
performed according to the standard protocol following intravenous
contrast administration.

[Series 3: axial st · axial · 0.67mm/px · z∈[-1633,-1185]mm · 13 of 258 slices shown, 15 images]
[im 17/258  soft-tissue]
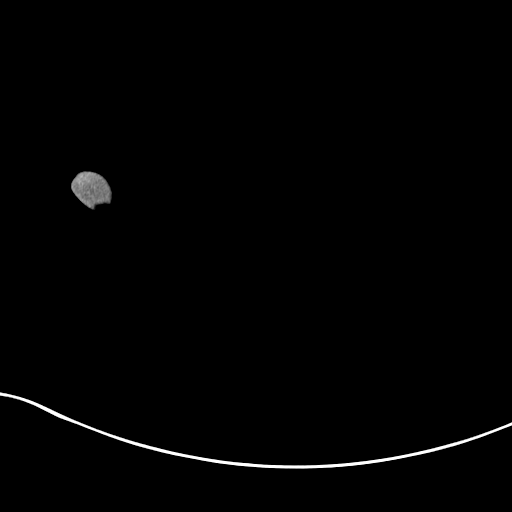
[im 17/258  bone]
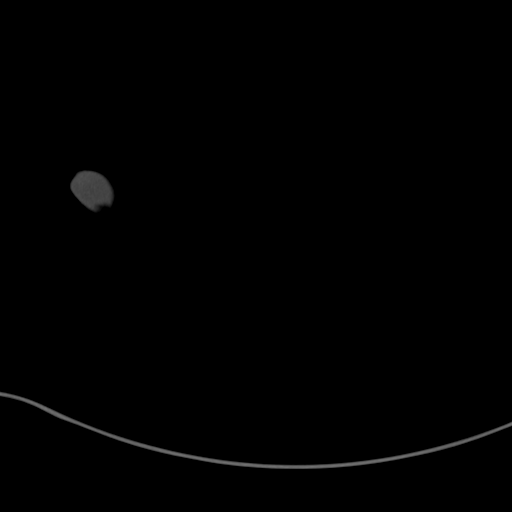
[im 34/258  soft-tissue]
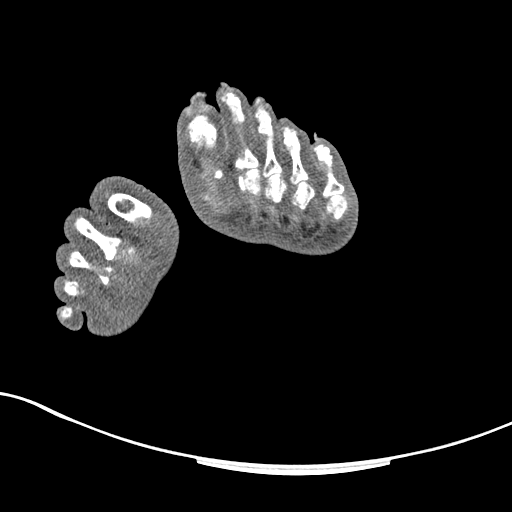
[im 50/258  soft-tissue]
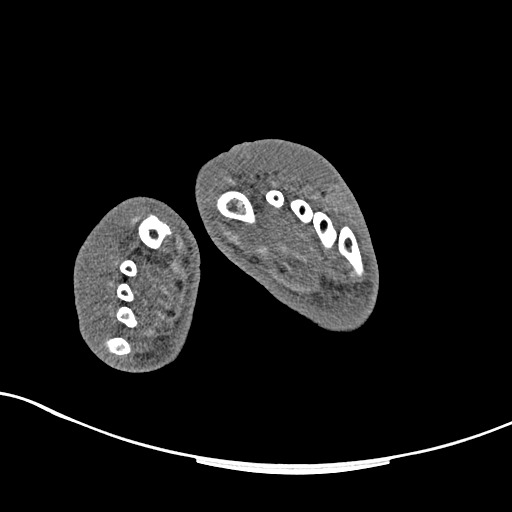
[im 75/258  soft-tissue]
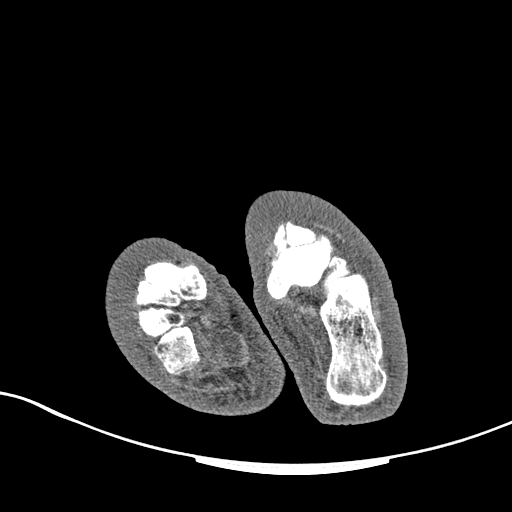
[im 92/258  soft-tissue]
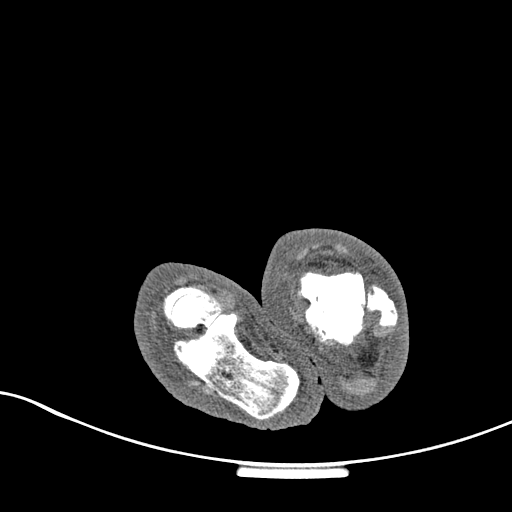
[im 108/258  soft-tissue]
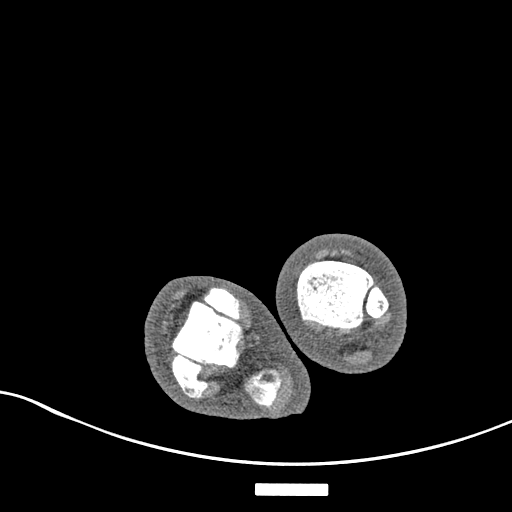
[im 133/258  soft-tissue]
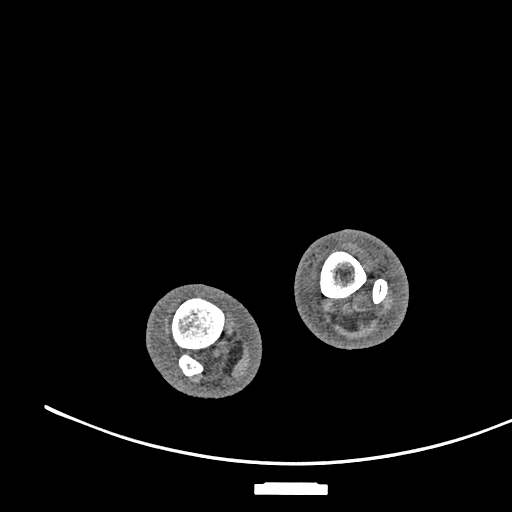
[im 150/258  soft-tissue]
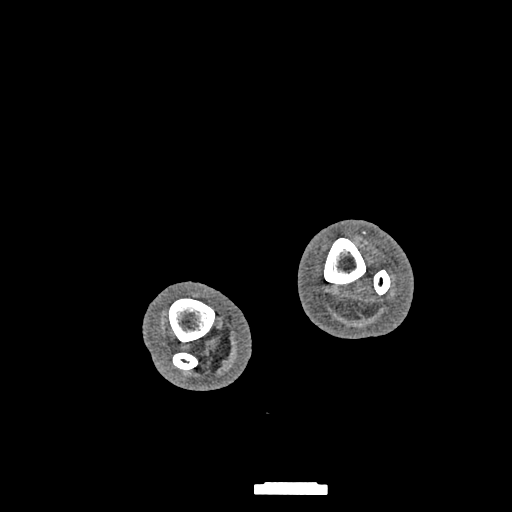
[im 166/258  soft-tissue]
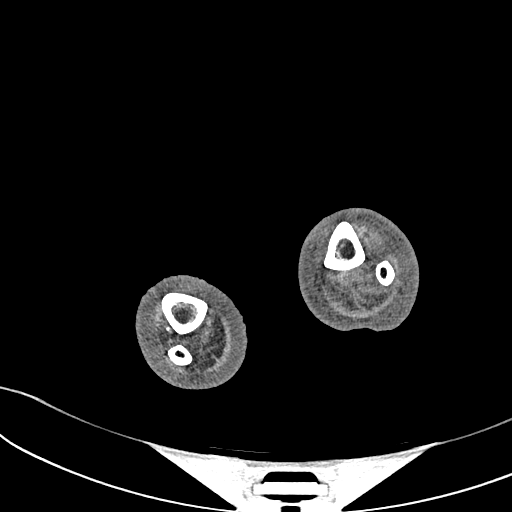
[im 166/258  bone]
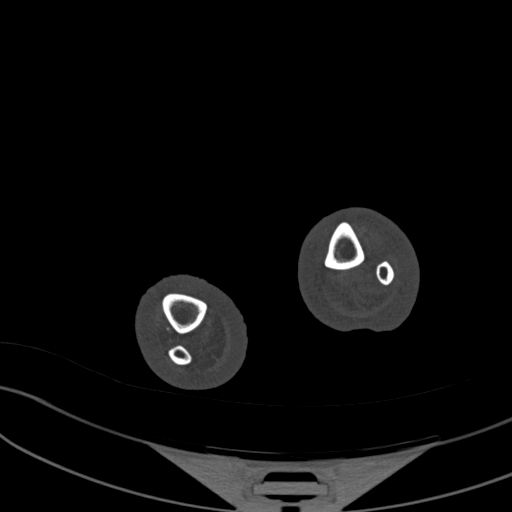
[im 183/258  soft-tissue]
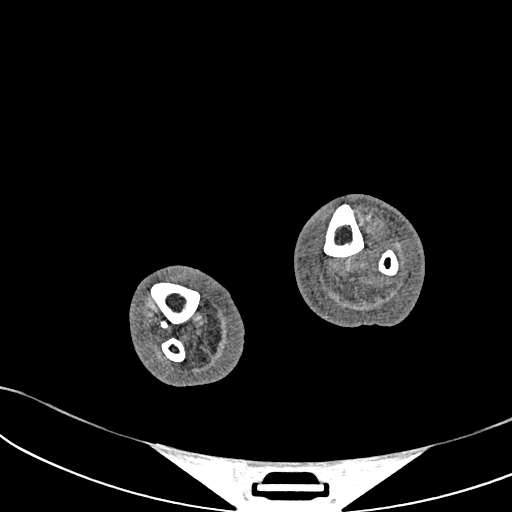
[im 208/258  soft-tissue]
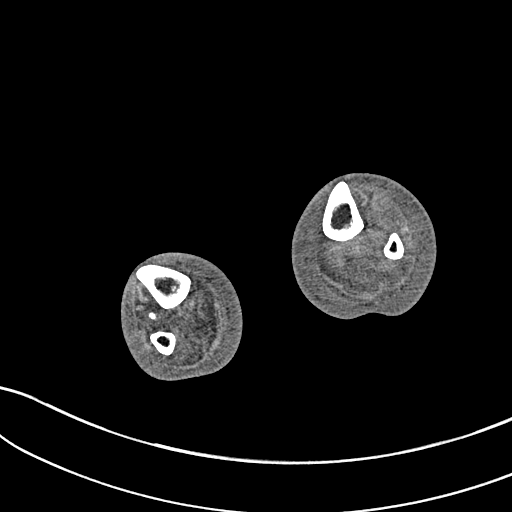
[im 224/258  soft-tissue]
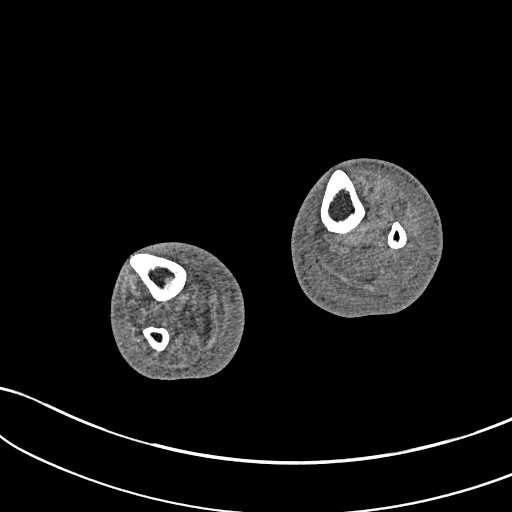
[im 241/258  soft-tissue]
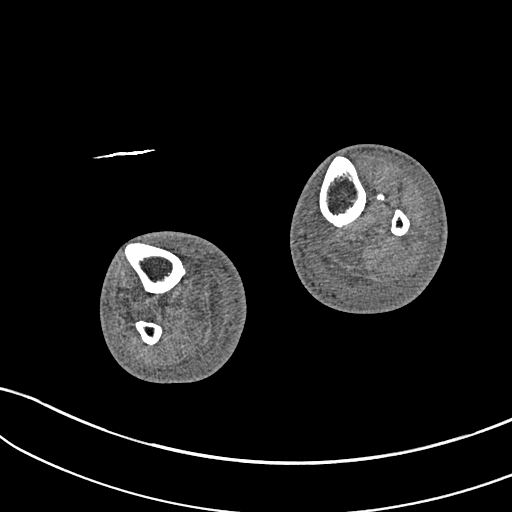

[Series 9: coronal st · coronal · 0.59mm/px · 3 of 160 slices shown]
[im 54/160  soft-tissue]
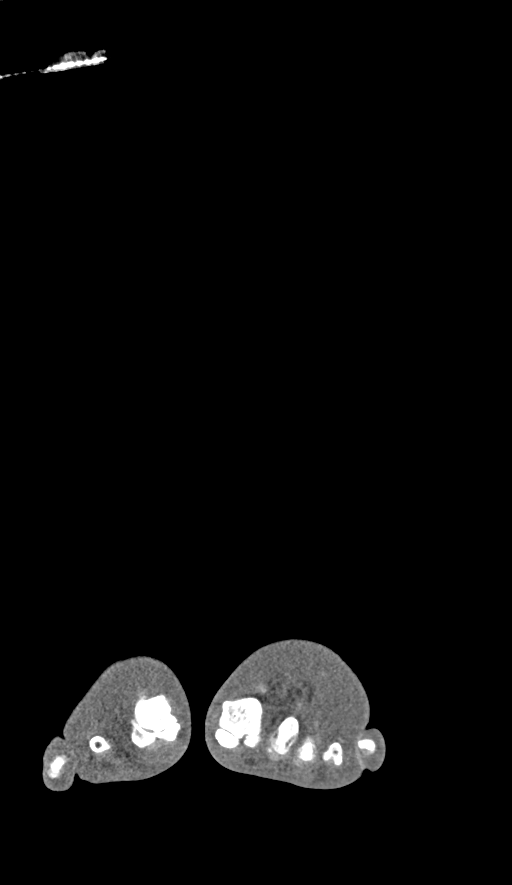
[im 71/160  soft-tissue]
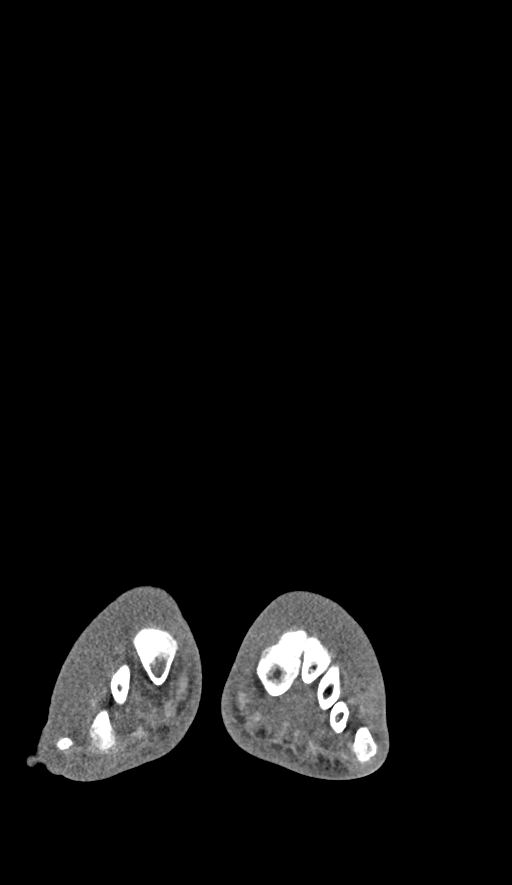
[im 89/160  soft-tissue]
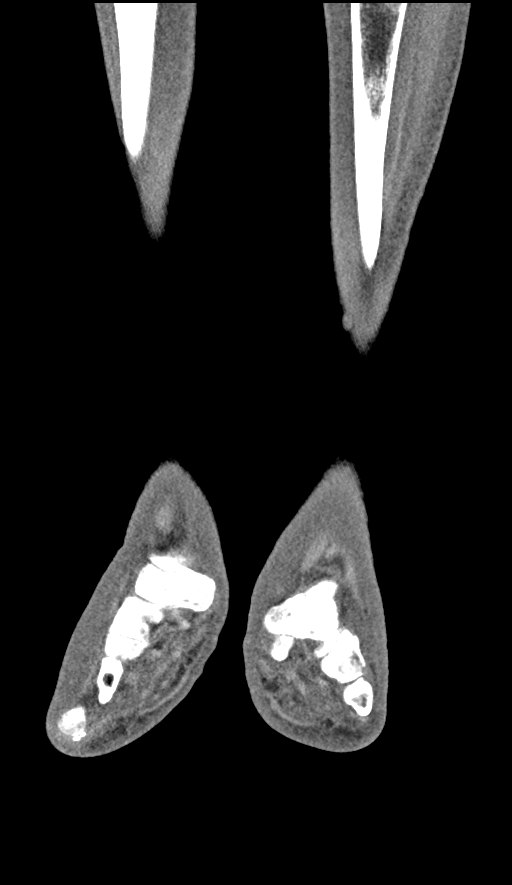

[16 of 46 positions shown; findings below may reference images not displayed]

FINDINGS: Bones/Joint/Cartilage

There are no acute or destructive bony lesions.

Ligaments

Suboptimally assessed by CT.

Muscles and Tendons

Unremarkable.

Soft tissues

There is diffuse subcutaneous edema throughout the bilateral lower
legs and feet, left greater than right. No fluid collection. Diffuse
atherosclerosis.
IMPRESSION: 1. No acute or destructive bony lesions.
2. Bilateral lower extremity subcutaneous edema, left greater than
right. No evidence of fluid collection or abscess.

## 2021-12-27 IMAGING — DX DG CHEST 1V PORT
1 series · 1 of 1 positions shown · non-contrast
Comparison: 10/14/2011

CLINICAL DATA: Fever, weakness.  Test for COVID pending.

EXAM:
PORTABLE CHEST 1 VIEW

[chest ap]
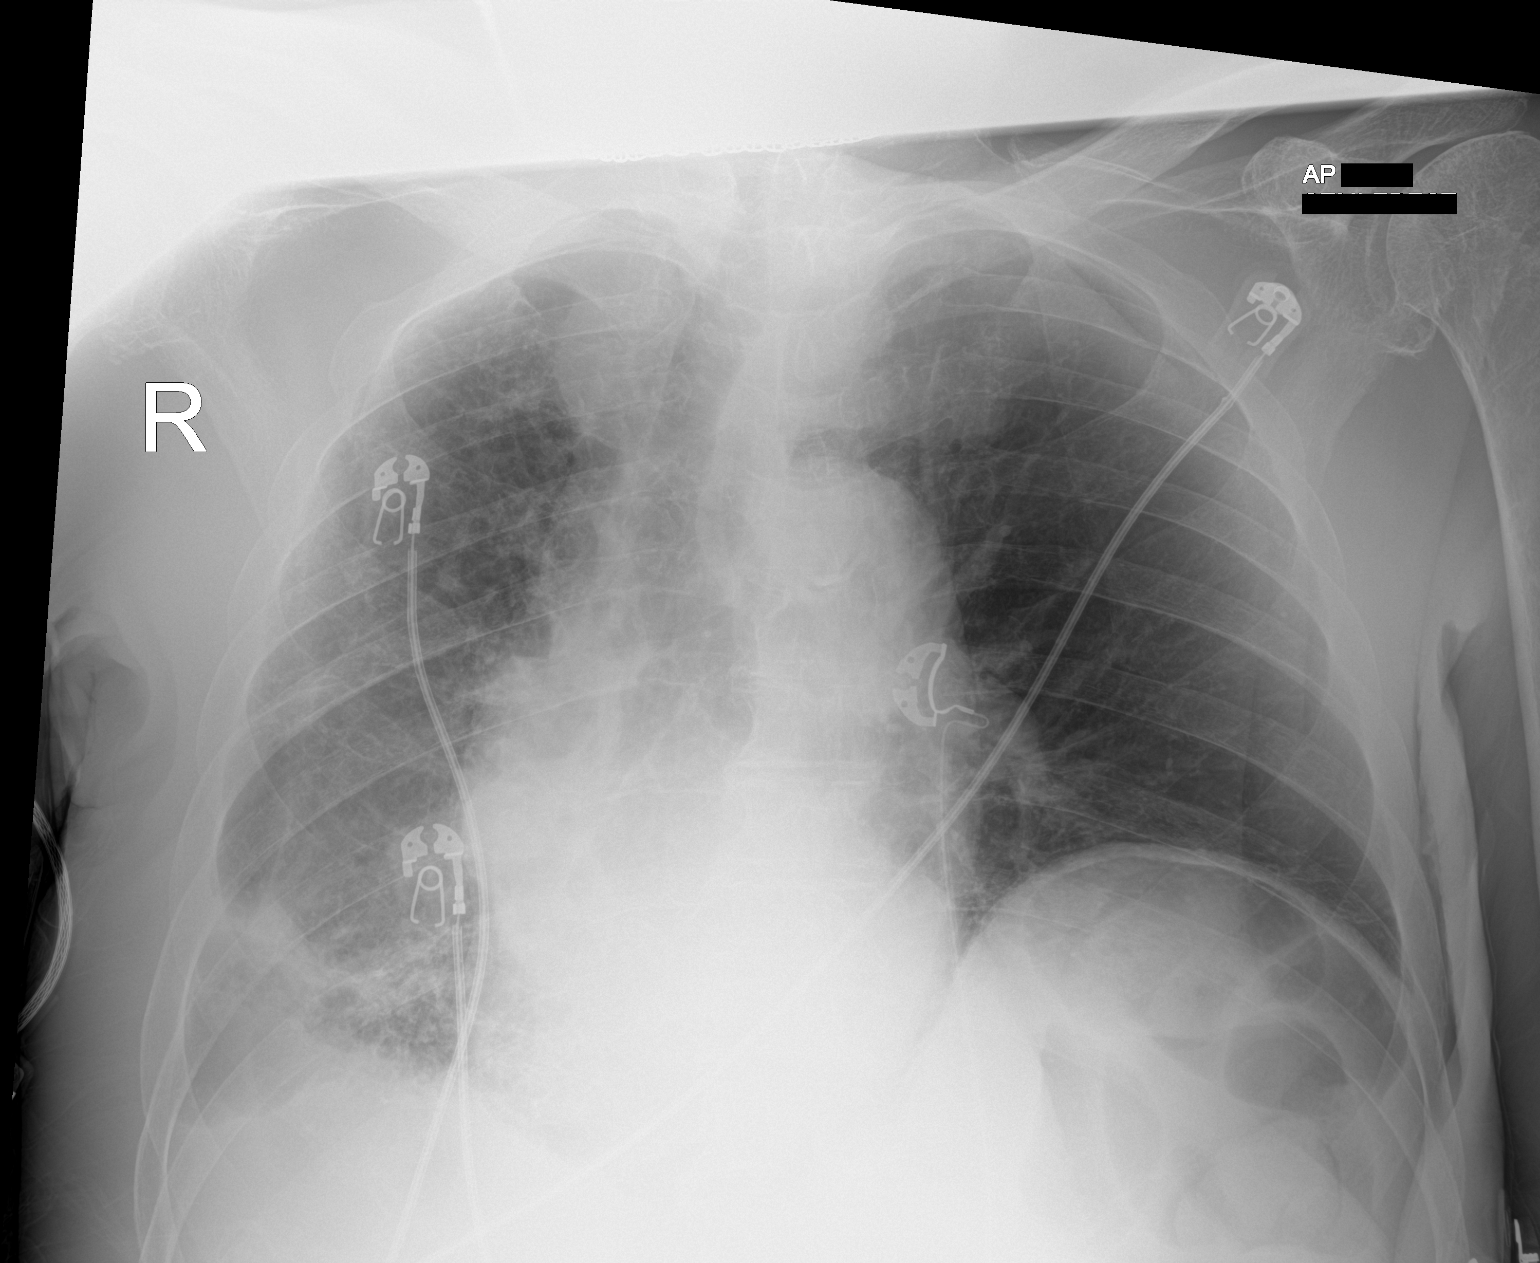

[1 of 1 positions shown; findings below may reference images not displayed]

FINDINGS: Cardiomediastinal silhouette is within normal limits accounting for
patient rotation. Small right pleural effusion. Right basilar
opacities. No discernible pneumothorax. Elevated left hemidiaphragm,
similar to prior. No acute osseous abnormality.
IMPRESSION: Small right pleural effusion with overlying opacities concerning for
pneumonia. Recommend follow-up to resolution.

## 2022-01-02 IMAGING — CR DG CHEST 2V
2 series · 2 of 2 positions shown · non-contrast
Comparison: June 29, 2020

CLINICAL DATA: Shortness of breath

EXAM:
CHEST - 2 VIEW

[chest lat]
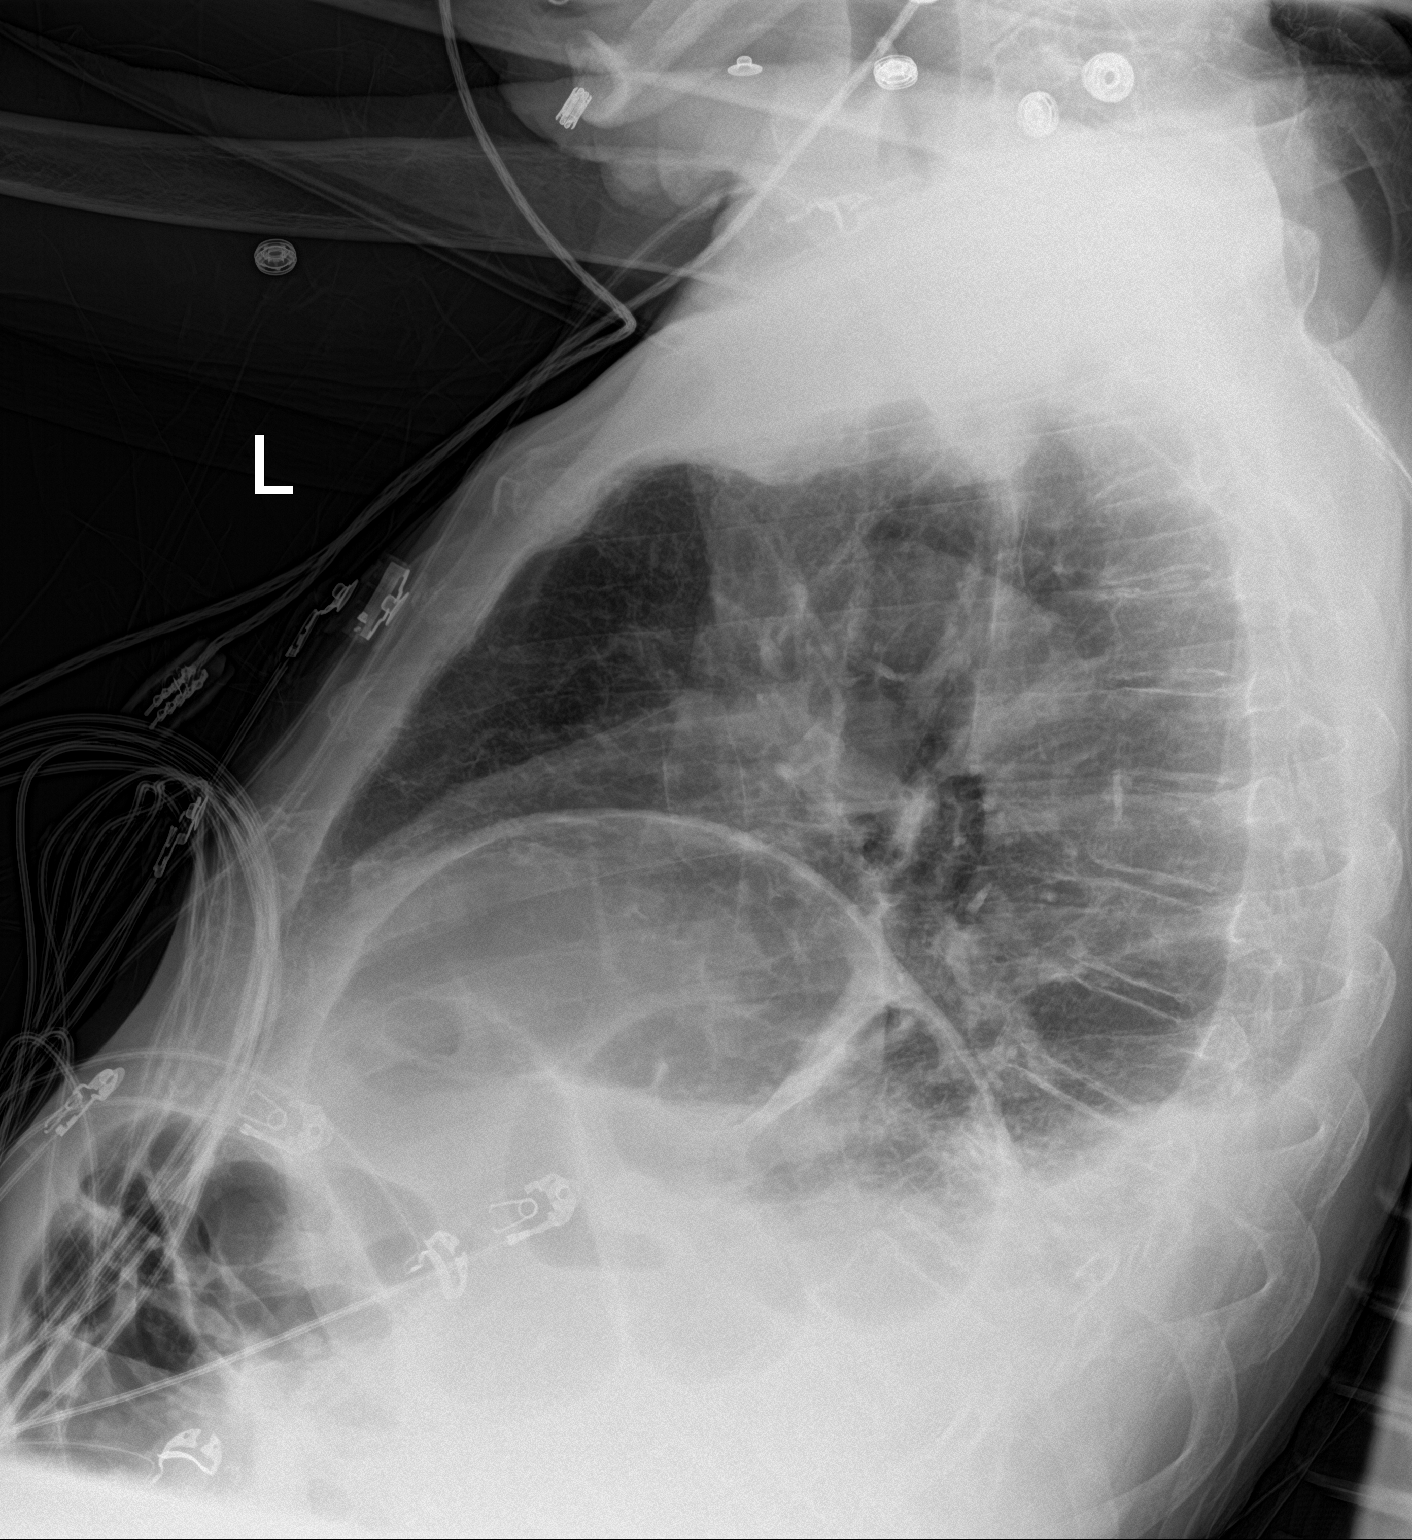

[chest ap]
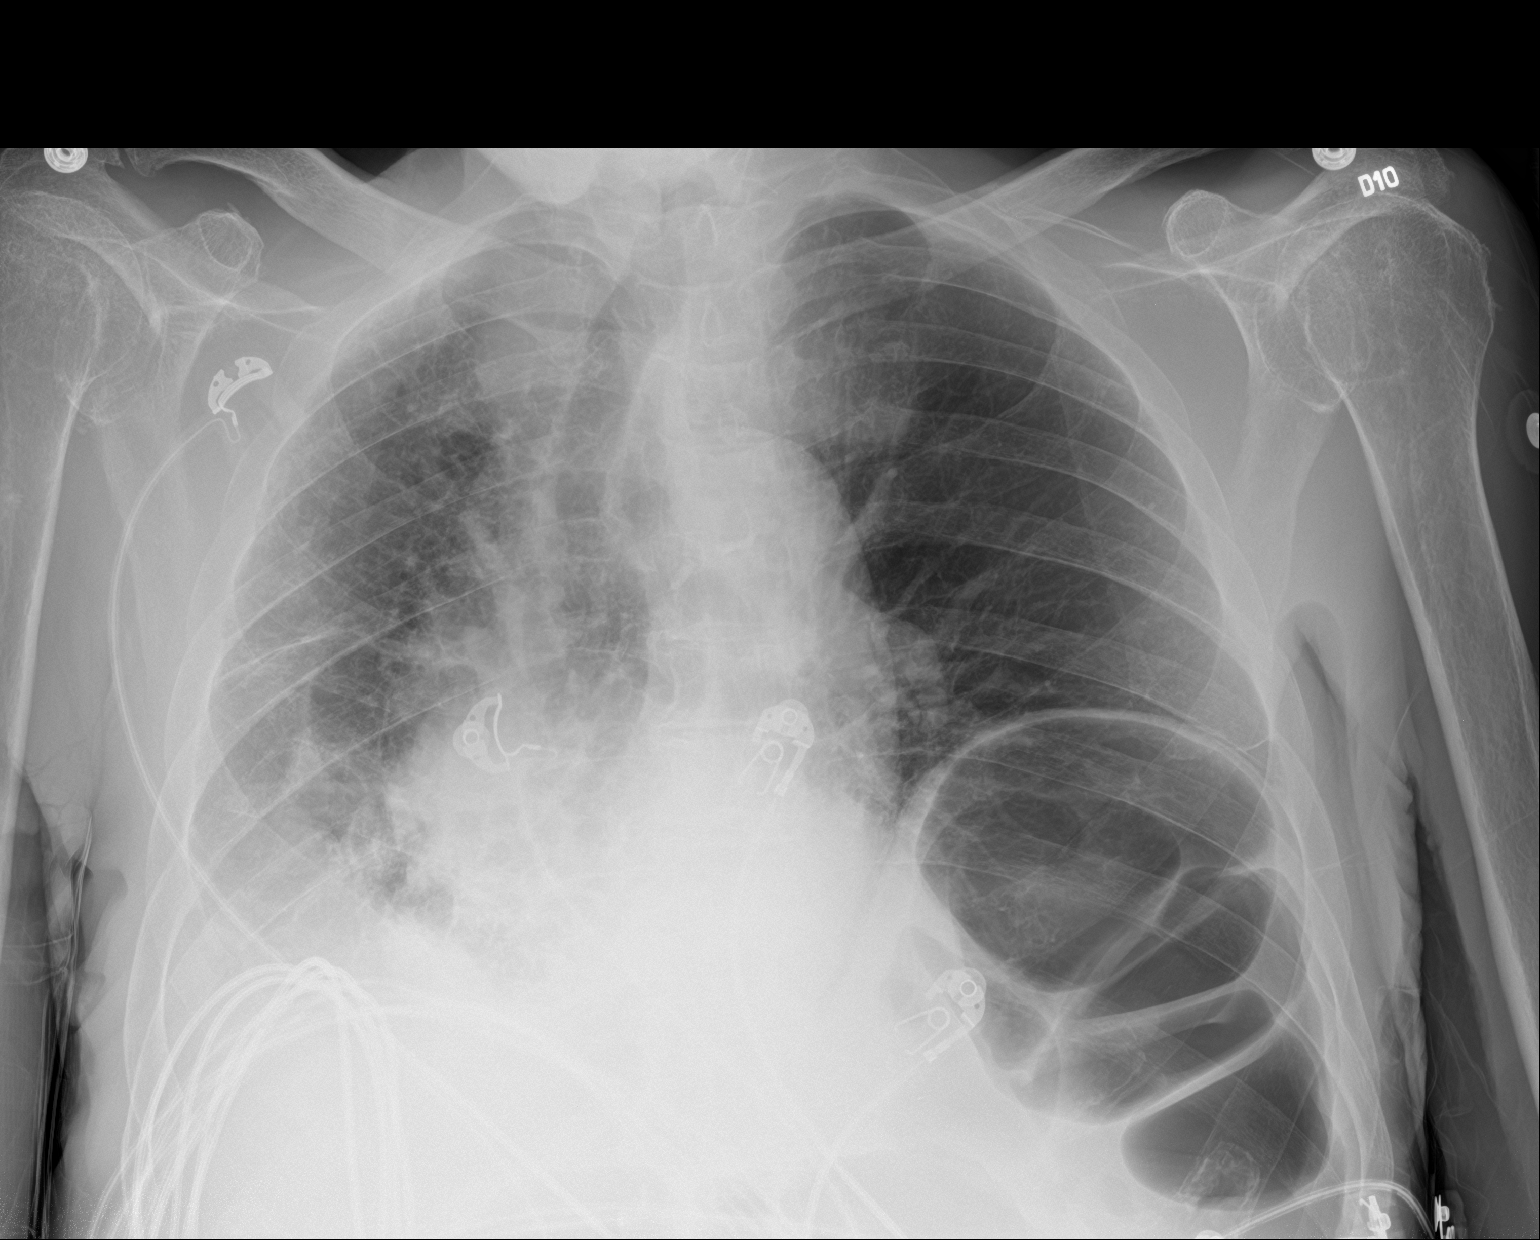

[2 of 2 positions shown; findings below may reference images not displayed]

FINDINGS: Degree of inspiration is shallow. There is elevation left
hemidiaphragm, stable. There is a persistent right pleural effusion
with atelectatic change and ill-defined airspace opacity in the
right base. Left lung is clear. Heart size and pulmonary vascular
normal. No adenopathy. There is aortic atherosclerosis. No
appreciable bone lesions.
IMPRESSION: Suspected small focus of pneumonia with atelectasis and effusion in
the right base region, similar to recent study. No new opacity
evident. Stable cardiac silhouette. No adenopathy evident by
radiography.

Aortic Atherosclerosis ([id number]-[id number]).
